# Patient Record
Sex: Male | Born: 1935 | Race: White | Hispanic: No | Marital: Married | Smoking: Never smoker
Health system: Southern US, Community
[De-identification: ages and names within clinical notes are randomized; demographics above are authoritative.]

## PROBLEM LIST (undated history)

## (undated) DIAGNOSIS — C9590 Leukemia, unspecified not having achieved remission: Secondary | ICD-10-CM

## (undated) DIAGNOSIS — H8110 Benign paroxysmal vertigo, unspecified ear: Secondary | ICD-10-CM

## (undated) DIAGNOSIS — Z8619 Personal history of other infectious and parasitic diseases: Secondary | ICD-10-CM

## (undated) DIAGNOSIS — C8300 Small cell B-cell lymphoma, unspecified site: Secondary | ICD-10-CM

## (undated) DIAGNOSIS — C4491 Basal cell carcinoma of skin, unspecified: Secondary | ICD-10-CM

## (undated) DIAGNOSIS — D472 Monoclonal gammopathy: Secondary | ICD-10-CM

## (undated) DIAGNOSIS — H903 Sensorineural hearing loss, bilateral: Secondary | ICD-10-CM

## (undated) DIAGNOSIS — Z8701 Personal history of pneumonia (recurrent): Secondary | ICD-10-CM

## (undated) DIAGNOSIS — D049 Carcinoma in situ of skin, unspecified: Secondary | ICD-10-CM

## (undated) DIAGNOSIS — D869 Sarcoidosis, unspecified: Secondary | ICD-10-CM

## (undated) DIAGNOSIS — N189 Chronic kidney disease, unspecified: Secondary | ICD-10-CM

## (undated) DIAGNOSIS — A0472 Enterocolitis due to Clostridium difficile, not specified as recurrent: Secondary | ICD-10-CM

## (undated) DIAGNOSIS — D519 Vitamin B12 deficiency anemia, unspecified: Secondary | ICD-10-CM

## (undated) DIAGNOSIS — D0339 Melanoma in situ of other parts of face: Secondary | ICD-10-CM

## (undated) HISTORY — DX: Sarcoidosis, unspecified: D86.9

## (undated) HISTORY — DX: Vitamin B12 deficiency anemia, unspecified: D51.9

## (undated) HISTORY — DX: Carcinoma in situ of skin, unspecified: D04.9

## (undated) HISTORY — DX: Benign paroxysmal vertigo, unspecified ear: H81.10

## (undated) HISTORY — DX: Basal cell carcinoma of skin, unspecified: C44.91

## (undated) HISTORY — DX: Enterocolitis due to Clostridium difficile, not specified as recurrent: A04.72

## (undated) HISTORY — DX: Chronic kidney disease, unspecified: N18.9

## (undated) HISTORY — DX: Personal history of pneumonia (recurrent): Z87.01

## (undated) HISTORY — DX: Sensorineural hearing loss, bilateral: H90.3

## (undated) HISTORY — DX: Personal history of other infectious and parasitic diseases: Z86.19

## (undated) HISTORY — DX: Small cell B-cell lymphoma, unspecified site: C83.00

## (undated) HISTORY — DX: Melanoma in situ of other parts of face: D03.39

## (undated) HISTORY — DX: Monoclonal gammopathy: D47.2

## (undated) HISTORY — DX: Leukemia, unspecified not having achieved remission: C95.90

---
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
## 1999-12-30 ENCOUNTER — Encounter: Payer: Self-pay | Admitting: Family Medicine

## 1999-12-30 LAB — CONVERTED CEMR LAB: PSA: 0.34 ng/mL

## 2003-06-01 ENCOUNTER — Encounter: Payer: Self-pay | Admitting: Family Medicine

## 2005-12-06 ENCOUNTER — Ambulatory Visit: Payer: Self-pay | Admitting: Family Medicine

## 2007-01-19 ENCOUNTER — Ambulatory Visit: Payer: Self-pay | Admitting: Family Medicine

## 2007-01-19 ENCOUNTER — Encounter (INDEPENDENT_AMBULATORY_CARE_PROVIDER_SITE_OTHER): Payer: Self-pay | Admitting: Internal Medicine

## 2007-01-19 LAB — CONVERTED CEMR LAB
Basophils Absolute: 0 10*3/uL (ref 0.0–0.1)
Creatinine, Ser: 1.81 mg/dL — ABNORMAL HIGH (ref 0.40–1.50)
Eosinophils Absolute: 0.1 10*3/uL (ref 0.0–0.6)
HCT: 44.9 % (ref 39.0–52.0)
Lymphocytes Relative: 15 % (ref 12.0–46.0)
MCHC: 33.9 g/dL (ref 30.0–36.0)
MCV: 94.5 fL (ref 78.0–100.0)
RBC: 4.75 M/uL (ref 4.22–5.81)
WBC: 12.8 10*3/uL — ABNORMAL HIGH (ref 4.5–10.5)

## 2007-01-20 ENCOUNTER — Ambulatory Visit: Payer: Self-pay | Admitting: Cardiology

## 2007-01-22 ENCOUNTER — Encounter: Payer: Self-pay | Admitting: Family Medicine

## 2007-03-02 ENCOUNTER — Ambulatory Visit: Payer: Self-pay | Admitting: Family Medicine

## 2007-03-02 DIAGNOSIS — D869 Sarcoidosis, unspecified: Secondary | ICD-10-CM

## 2007-03-04 ENCOUNTER — Encounter (INDEPENDENT_AMBULATORY_CARE_PROVIDER_SITE_OTHER): Payer: Self-pay | Admitting: *Deleted

## 2007-03-10 ENCOUNTER — Encounter: Payer: Self-pay | Admitting: Family Medicine

## 2007-03-12 ENCOUNTER — Ambulatory Visit: Payer: Self-pay | Admitting: Family Medicine

## 2008-09-30 DIAGNOSIS — Z8619 Personal history of other infectious and parasitic diseases: Secondary | ICD-10-CM

## 2008-09-30 HISTORY — DX: Personal history of other infectious and parasitic diseases: Z86.19

## 2009-04-17 ENCOUNTER — Ambulatory Visit: Payer: Self-pay | Admitting: Family Medicine

## 2009-04-17 DIAGNOSIS — H8110 Benign paroxysmal vertigo, unspecified ear: Secondary | ICD-10-CM | POA: Insufficient documentation

## 2009-04-21 ENCOUNTER — Telehealth: Payer: Self-pay | Admitting: Internal Medicine

## 2009-05-19 ENCOUNTER — Ambulatory Visit: Payer: Self-pay | Admitting: Family Medicine

## 2009-05-19 DIAGNOSIS — B029 Zoster without complications: Secondary | ICD-10-CM | POA: Insufficient documentation

## 2010-05-02 ENCOUNTER — Encounter (INDEPENDENT_AMBULATORY_CARE_PROVIDER_SITE_OTHER): Payer: Self-pay | Admitting: *Deleted

## 2010-10-30 NOTE — Letter (Signed)
Summary: Nadara Eaton letter  [place] at [facility]  [address] [place], [place] [address]   Phone: [phone]  Fax: [phone]       05/02/2010 MRN: [id number]  Harold Chavez [address] [place], [place]  [address]  Dear Mr. Canedo,  [facility] [place], and [place] announce the retirement of Arta Silence, M.D., from full-time practice at the [facility] office effective March 29, 2010 and his plans of returning part-time.  It is important to Dr. Hetty Ely and to our practice that you understand that [facility] has seven physicians in our office for your health care needs.  We will continue to offer the same exceptional care that you have today.    Dr. Hetty Ely has spoken to many of you about his plans for retirement and returning part-time in the fall.   We will continue to work with you through the transition to schedule appointments for you in the office and meet the high standards that [facility] is committed to.   Again, it is with great pleasure that we share the news that Dr. Hetty Ely will return to [facility] in October of 2011 with a reduced schedule.    If you have any questions, or would like to request an appointment with one of our physicians, please call us at [phone] and press the option for Scheduling an appointment.  We take pleasure in providing you with excellent patient care and look forward to seeing you at your next office visit.  Our [facility] Physicians are:  Tillman Abide, M.D. Laurita Quint, M.D. Roxy Manns, M.D. Kerby Nora, M.D. Hannah Beat, M.D. Ruthe Mannan, M.D. We proudly welcomed Raechel Ache, M.D. and Eustaquio Boyden, M.D. to the practice in July/August 2011.  Sincerely,  [facility]

## 2011-02-15 NOTE — Assessment & Plan Note (Signed)
[facility]                                 ON-CALL NOTE   YOSSEF, GILKISON                         MRN:          [id number]  DATE:01/19/2007                            DOB:          [birth year]    CALLER:  Riley Kill.   TELEPHONE NUMBER:  [phone]   SUBJECTIVE:  Mr. Suriano wife is calling this evening. She states that  starting yesterday evening, he had left lower back pain over towards the  flank that was fairly severe and intermittent, along with several  episodes of vomiting. That resolved last night after taking Tylenol. He  felt fine earlier today and then the pain has returned this evening. He  has also been somewhat constipated and has had gas. He denies fevers,  chills, abdominal pain, shortness of breath, or chest pain. The pain is  about two inches above his waist line and two inches towards his flank  on his left lower back.   ASSESSMENT AND PLAN:  Low back pain and possible flank pain: It is  possible that this is secondary to a kidney stone versus musculoskeletal  pain versus referred abdominal pain. I discussed with Mrs. Windom to talk  with him about the severity of the pain and if his pain is moderate to  severe to be seen at any emergency room this evening, as well as if he  develops vomiting and fever. If his pain is more mild to moderate, he  can consider waiting until tomorrow to be seen at the outpatient clinic,  given that it is 2am on Sunday night.     Kerby Nora, MD  Electronically Signed    AB/MedQ  DD: 01/19/2007  DT: 01/19/2007  Job #: [phone] [id number]

## 2011-11-18 ENCOUNTER — Ambulatory Visit (INDEPENDENT_AMBULATORY_CARE_PROVIDER_SITE_OTHER): Payer: Medicare Other | Admitting: Family Medicine

## 2011-11-18 ENCOUNTER — Encounter: Payer: Self-pay | Admitting: Family Medicine

## 2011-11-18 VITALS — BP 140/82 | HR 100 | Temp 98.8°F | Wt 161.8 lb

## 2011-11-18 DIAGNOSIS — J069 Acute upper respiratory infection, unspecified: Secondary | ICD-10-CM | POA: Diagnosis not present

## 2011-11-18 MED ORDER — AZITHROMYCIN 250 MG PO TABS: ORAL_TABLET | ORAL | Status: AC

## 2011-11-18 NOTE — Assessment & Plan Note (Signed)
Anticipate viral URTI.  discussed symptomatic relief (see instructions) If not improving, fill script (sent home with printed script for zpack).

## 2011-11-18 NOTE — Patient Instructions (Signed)
Sounds like you have a upper respiratory infection, likely viral. Viral infections usually take 7-10 days to resolve.  The cough can last a few weeks to go away. Use medication as prescribed: zpack to hold on to in case not improving as expected or any worsening. Push fluids and plenty of rest. continue mucinex with fluid.  If feeling congested, may use nasal saline.  Continue tylenol as needed. Please return if you are not improving as expected, or if you have high fevers (>101.5) or difficulty swallowing or worsening productive cough. Call clinic with questions.  Good to see you today.

## 2011-11-18 NOTE — Progress Notes (Signed)
  Subjective:    Patient ID: Harold Chavez, male    DOB: 06-01-1924, 78 y.o.   MRN: [id number]  HPI CC: cold sxs  5d h/o upper respiratory symptoms.  Low grade fever up to 100.7 today.  Mild cough productive of mild sputum.  + congested in sinuses.  So far has tried mucinex DM and tylenol.  No RN, ST, PNDrainage, no ear pain or tooth pain.  No headache.  No abd pain, chest pain, SOB, n/v.  + son sick at home.  No smokers at home.  No h/o asthma, COPD.  Review of Systems per HPI    Objective:   Physical Exam  Nursing note and vitals reviewed. Constitutional: He appears well-developed and well-nourished. No distress.  HENT:  Head: Normocephalic and atraumatic.  Right Ear: Hearing, tympanic membrane, external ear and ear canal normal.  Left Ear: Hearing, tympanic membrane, external ear and ear canal normal.  Nose: Nose normal. No mucosal edema or rhinorrhea. Right sinus exhibits no maxillary sinus tenderness and no frontal sinus tenderness. Left sinus exhibits no maxillary sinus tenderness and no frontal sinus tenderness.  Mouth/Throat: Uvula is midline, oropharynx is clear and moist and mucous membranes are normal. No oropharyngeal exudate, posterior oropharyngeal edema, posterior oropharyngeal erythema or tonsillar abscesses.       Cerumen R canal Thick white drainage in back of pharynxi  Eyes: Conjunctivae and EOM are normal. Pupils are equal, round, and reactive to light. No scleral icterus.  Neck: Normal range of motion. Neck supple.  Cardiovascular: Normal rate, regular rhythm, normal heart sounds and intact distal pulses.   No murmur heard. Pulmonary/Chest: Effort normal and breath sounds normal. No respiratory distress. He has no wheezes. He has no rales.  Lymphadenopathy:    He has no cervical adenopathy.  Skin: Skin is warm and dry. No rash noted.       Assessment & Plan:

## 2011-11-28 ENCOUNTER — Ambulatory Visit (INDEPENDENT_AMBULATORY_CARE_PROVIDER_SITE_OTHER): Payer: Medicare Other | Admitting: Family Medicine

## 2011-11-28 ENCOUNTER — Encounter: Payer: Self-pay | Admitting: Family Medicine

## 2011-11-28 VITALS — BP 144/90 | HR 100 | Temp 97.8°F | Wt 158.2 lb

## 2011-11-28 DIAGNOSIS — R21 Rash and other nonspecific skin eruption: Secondary | ICD-10-CM | POA: Diagnosis not present

## 2011-11-28 NOTE — Progress Notes (Signed)
  Subjective:    Patient ID: Harold Chavez, male    DOB: October 24, 1923, 78 y.o.   MRN: [id number]  HPI CC: rash  Seen here 11/15/2011 with dx viral URTI, sent home with zpack in case not improving.  Actually filled zpack and finished taking yesterday.  Feeling better today.  Started noticing bumps BEFORE zpack started.  Had also taken mucinex.  Rash started left back shoulder, has spread all over trunk and back.  Spares face, arms, legs.  Some itchy, not painful.    Denies new lotions, detergents, soaps, shampoos, new foods, new medicines.  H/o shingles years ago, treated with antiviral.  No sequelae.  No one else at home with rash.  No fevers/chills, nausea/vomiting, abd pain, joint pains, oral lesions.  Review of Systems Per HPI    Objective:   Physical Exam  Nursing note and vitals reviewed. Constitutional: He appears well-developed and well-nourished. No distress.  HENT:  Mouth/Throat: Oropharynx is clear and moist. No oropharyngeal exudate.       No oral lesions  Skin: Skin is warm and dry. Rash noted.          Diffuse excoriated papular rash trunk and back.  Mildly pruritic Spares face, arms, legs       Assessment & Plan:

## 2011-11-28 NOTE — Assessment & Plan Note (Signed)
Not vesicular, not consistent with shingles. ?viral exanthem vs bug bites. rec oatmeal bath and avoid scratching. Update [place] if not improved. See pt instructions.

## 2011-11-28 NOTE — Patient Instructions (Signed)
This could be viral rash after respiratory infection you had 2 weeks ago. Wash all bedding and clothing. This is not shingles. Use oatmeal bath with lukewarm water. Let me know if worsening or not improving as expected.

## 2012-01-14 DIAGNOSIS — H2510 Age-related nuclear cataract, unspecified eye: Secondary | ICD-10-CM | POA: Diagnosis not present

## 2012-01-14 DIAGNOSIS — H25049 Posterior subcapsular polar age-related cataract, unspecified eye: Secondary | ICD-10-CM | POA: Diagnosis not present

## 2012-01-14 DIAGNOSIS — H25019 Cortical age-related cataract, unspecified eye: Secondary | ICD-10-CM | POA: Diagnosis not present

## 2012-01-21 DIAGNOSIS — Z0100 Encounter for examination of eyes and vision without abnormal findings: Secondary | ICD-10-CM | POA: Diagnosis not present

## 2012-10-27 ENCOUNTER — Telehealth: Payer: Self-pay | Admitting: Internal Medicine

## 2013-06-11 DIAGNOSIS — C9110 Chronic lymphocytic leukemia of B-cell type not having achieved remission: Secondary | ICD-10-CM | POA: Diagnosis not present

## 2013-06-11 DIAGNOSIS — R197 Diarrhea, unspecified: Secondary | ICD-10-CM | POA: Diagnosis not present

## 2013-06-11 DIAGNOSIS — A0472 Enterocolitis due to Clostridium difficile, not specified as recurrent: Secondary | ICD-10-CM | POA: Diagnosis not present

## 2013-09-30 DIAGNOSIS — C8300 Small cell B-cell lymphoma, unspecified site: Secondary | ICD-10-CM

## 2013-09-30 DIAGNOSIS — D0339 Melanoma in situ of other parts of face: Secondary | ICD-10-CM

## 2013-09-30 HISTORY — DX: Small cell B-cell lymphoma, unspecified site: C83.00

## 2013-09-30 HISTORY — DX: Melanoma in situ of other parts of face: D03.39

## 2013-09-30 HISTORY — PX: MOHS SURGERY: SUR867

## 2013-11-06 ENCOUNTER — Emergency Department (HOSPITAL_COMMUNITY): Payer: Medicare Other

## 2013-11-06 ENCOUNTER — Encounter (HOSPITAL_COMMUNITY): Payer: Self-pay | Admitting: Emergency Medicine

## 2013-11-06 ENCOUNTER — Emergency Department (HOSPITAL_COMMUNITY)
Admission: EM | Admit: 2013-11-06 | Discharge: 2013-11-06 | Disposition: A | Discharge status: Home / Self Care | Payer: Medicare Other | Attending: Emergency Medicine | Admitting: Emergency Medicine

## 2013-11-06 DIAGNOSIS — R000 Tachycardia, unspecified: Secondary | ICD-10-CM | POA: Insufficient documentation

## 2013-11-06 DIAGNOSIS — R5383 Other fatigue: Secondary | ICD-10-CM

## 2013-11-06 DIAGNOSIS — R5381 Other malaise: Secondary | ICD-10-CM | POA: Diagnosis not present

## 2013-11-06 DIAGNOSIS — R599 Enlarged lymph nodes, unspecified: Secondary | ICD-10-CM | POA: Diagnosis not present

## 2013-11-06 DIAGNOSIS — R748 Abnormal levels of other serum enzymes: Secondary | ICD-10-CM | POA: Diagnosis not present

## 2013-11-06 DIAGNOSIS — Z8619 Personal history of other infectious and parasitic diseases: Secondary | ICD-10-CM | POA: Insufficient documentation

## 2013-11-06 DIAGNOSIS — R799 Abnormal finding of blood chemistry, unspecified: Secondary | ICD-10-CM

## 2013-11-06 DIAGNOSIS — Z7982 Long term (current) use of aspirin: Secondary | ICD-10-CM | POA: Insufficient documentation

## 2013-11-06 DIAGNOSIS — Z8669 Personal history of other diseases of the nervous system and sense organs: Secondary | ICD-10-CM | POA: Insufficient documentation

## 2013-11-06 DIAGNOSIS — R591 Generalized enlarged lymph nodes: Secondary | ICD-10-CM

## 2013-11-06 DIAGNOSIS — R404 Transient alteration of awareness: Secondary | ICD-10-CM | POA: Diagnosis not present

## 2013-11-06 DIAGNOSIS — R55 Syncope and collapse: Secondary | ICD-10-CM | POA: Insufficient documentation

## 2013-11-06 DIAGNOSIS — H8110 Benign paroxysmal vertigo, unspecified ear: Secondary | ICD-10-CM

## 2013-11-06 DIAGNOSIS — R779 Abnormality of plasma protein, unspecified: Secondary | ICD-10-CM

## 2013-11-06 DIAGNOSIS — R7989 Other specified abnormal findings of blood chemistry: Secondary | ICD-10-CM

## 2013-11-06 DIAGNOSIS — R21 Rash and other nonspecific skin eruption: Secondary | ICD-10-CM | POA: Insufficient documentation

## 2013-11-06 DIAGNOSIS — R6889 Other general symptoms and signs: Secondary | ICD-10-CM | POA: Diagnosis not present

## 2013-11-06 DIAGNOSIS — R634 Abnormal weight loss: Secondary | ICD-10-CM | POA: Insufficient documentation

## 2013-11-06 DIAGNOSIS — J449 Chronic obstructive pulmonary disease, unspecified: Secondary | ICD-10-CM | POA: Diagnosis not present

## 2013-11-06 LAB — CBC WITH DIFFERENTIAL/PLATELET
Basophils Absolute: 0 10*3/uL (ref 0.0–0.1)
Basophils Relative: 0 % (ref 0–1)
Eosinophils Absolute: 0.1 10*3/uL (ref 0.0–0.7)
Eosinophils Relative: 1 % (ref 0–5)
HCT: 29.5 % — ABNORMAL LOW (ref 39.0–52.0)
Hemoglobin: 10.1 g/dL — ABNORMAL LOW (ref 13.0–17.0)
LYMPHS PCT: 29 % (ref 12–46)
Lymphs Abs: 2 10*3/uL (ref 0.7–4.0)
MCH: 34.7 pg — ABNORMAL HIGH (ref 26.0–34.0)
MCHC: 34.2 g/dL (ref 30.0–36.0)
MCV: 101.4 fL — ABNORMAL HIGH (ref 78.0–100.0)
Monocytes Absolute: 0.6 10*3/uL (ref 0.1–1.0)
Monocytes Relative: 9 % (ref 3–12)
Neutro Abs: 4.1 10*3/uL (ref 1.7–7.7)
Neutrophils Relative %: 61 % (ref 43–77)
PLATELETS: 210 10*3/uL (ref 150–400)
RBC: 2.91 MIL/uL — ABNORMAL LOW (ref 4.22–5.81)
RDW: 14.4 % (ref 11.5–15.5)
WBC: 6.7 10*3/uL (ref 4.0–10.5)

## 2013-11-06 LAB — POCT I-STAT TROPONIN I: TROPONIN I, POC: 0.02 ng/mL (ref 0.00–0.08)

## 2013-11-06 LAB — COMPREHENSIVE METABOLIC PANEL
ALT: 5 U/L (ref 0–53)
AST: 18 U/L (ref 0–37)
Albumin: 2.5 g/dL — ABNORMAL LOW (ref 3.5–5.2)
Alkaline Phosphatase: 74 U/L (ref 39–117)
BILIRUBIN TOTAL: 1 mg/dL (ref 0.3–1.2)
BUN: 31 mg/dL — ABNORMAL HIGH (ref 6–23)
CHLORIDE: 98 meq/L (ref 96–112)
CO2: 24 meq/L (ref 19–32)
Calcium: 8.3 mg/dL — ABNORMAL LOW (ref 8.4–10.5)
Creatinine, Ser: 1.93 mg/dL — ABNORMAL HIGH (ref 0.50–1.35)
GFR calc Af Amer: 34 mL/min — ABNORMAL LOW (ref >90)
GFR, EST NON AFRICAN AMERICAN: 30 mL/min — AB (ref >90)
Glucose, Bld: 96 mg/dL (ref 70–99)
Potassium: 5.2 mEq/L (ref 3.7–5.3)
SODIUM: 131 meq/L — AB (ref 137–147)
Total Protein: 13.8 g/dL — ABNORMAL HIGH (ref 6.0–8.3)

## 2013-11-06 LAB — URINALYSIS, ROUTINE W REFLEX MICROSCOPIC
Bilirubin Urine: NEGATIVE
GLUCOSE, UA: NEGATIVE mg/dL
KETONES UR: NEGATIVE mg/dL
Leukocytes, UA: NEGATIVE
Nitrite: NEGATIVE
PROTEIN: 30 mg/dL — AB
Specific Gravity, Urine: 1.017 (ref 1.005–1.030)
Urobilinogen, UA: 1 mg/dL (ref 0.0–1.0)
pH: 5 (ref 5.0–8.0)

## 2013-11-06 LAB — URINE MICROSCOPIC-ADD ON

## 2013-11-06 LAB — MAGNESIUM: Magnesium: 1.9 mg/dL (ref 1.5–2.5)

## 2013-11-06 LAB — PHOSPHORUS: Phosphorus: 3.8 mg/dL (ref 2.3–4.6)

## 2013-11-06 LAB — PROTIME-INR
INR: 1.26 (ref 0.00–1.49)
PROTHROMBIN TIME: 15.5 s — AB (ref 11.6–15.2)

## 2013-11-06 LAB — TROPONIN I: Troponin I: <0.3 ng/mL (ref <0.30)

## 2013-11-06 MED ORDER — SODIUM CHLORIDE 0.9 % IV BOLUS (SEPSIS)
1000.0000 mL | Freq: Once | INTRAVENOUS | Status: AC
  Administered 2013-11-06: 1000 mL via INTRAVENOUS

## 2013-11-06 MED ORDER — SODIUM CHLORIDE 0.9 % IV BOLUS (SEPSIS)
500.0000 mL | Freq: Once | INTRAVENOUS | Status: AC
  Administered 2013-11-06: 500 mL via INTRAVENOUS

## 2013-11-06 NOTE — ED Notes (Signed)
Iv nss hung for a liter bolus.  Vitals good.  No pain  Alert talking with family

## 2013-11-06 NOTE — ED Notes (Signed)
The pt reports that he has been feeling weaker with no energy for several days before today.

## 2013-11-06 NOTE — ED Notes (Signed)
Per EMS- Pt was outside with family doing yardwork was sitting down when his son reports he became unresponsive for about 3 mins. Pt did not fall over. Denies any pain, negative stroke screen. When EMS arrived he was x 4. Initial BP 98 systolic, recent 680/32, HR 105 ST. 18 gauge to left AC. Pt has rash to his chest and back x 3 weeks.

## 2013-11-06 NOTE — Consult Note (Addendum)
[facility] Medical Consultation  Harold Chavez UMP:[id number] DOB: Sep 25, 1924 DOA: 11/06/2013 PCP: Ria Bush, MD   Requesting physician: Dr. Tawnya Crook Date of consultation: 11/06/13 Reason for consultation: near syncopal episode, consult for further evaluation and recommendations.  Impression/Recommendations Active Problems:   Near syncope - Patient reports poor fluid oral intake. The patient was given 1.5 L of normal saline. Currently feels much better. Patient was ambulated and he denied any dizziness or unsteadiness on his feet. He most likely etiology is hypovolemia which has been treated with IV fluid rehydration.  - At this juncture most likely cause of near syncope as listed above has been resolved and patient currently not experiencing any more symptoms.  - Plan will be for discharge with family to encourage fluid po intake. Discussed with family and patient and they are aware and agreeable.  Elevated serum protein - Have recommended outpatient evaluation which patient and family are agreeable to.  Chronic kidney disease - Last serum creatinine 1.8. Addendum: currently on this evaluation 1.9. Patient at baseline.  Sinus tachycardia - Resolving with IV fluid hydration. Suspect this was secondary to hypovolemia from poor oral fluid intake. Instructed the patient should avoid any caffeine, tea, excess sugary drinks, or coffee. Have encouraged patient to increase oral fluid intake of which currently he is agreeable to.  Lymphadenopathy - Currently no source of infection. White blood cell count within normal limits. Patient afebrile. Currently not meeting SIRs criteria. - I have also recommended outpatient evaluation of which patient and family verbalizes agreement to.   Chief Complaint: near syncope  HPI:  Patient is an 80 year old Caucasian male with history of sarcoidosis and benign positional vertigo. Who presents to the ED after a near syncopal event. He reports that  within the last several days he has not been drinking enough fluids. He denies any fevers or chills. No sick contacts reported. Patient was seen in the ED and given 1.5 L of normal saline. Currently patient has no complaints. He currently denies any vertigo or near syncope with changes in position. Patient also endorses rash which she has had and denies any associated fevers with it.  Review of Systems:  14 point review of system reviewed and negative unless otherwise mentioned above.  Past Medical History  Diagnosis Date  . Benign paroxysmal positional vertigo   . Sarcoidosis   . Herpes zoster without mention of complication    Past Surgical History  Procedure Laterality Date  . Testicle removal  1937    right; undescended   Social History:  reports that he has never smoked. He does not have any smokeless tobacco history on file. He reports that he does not drink alcohol or use illicit drugs.  No Known Allergies Family History  Problem Relation Age of Onset  . Stroke Father     Prior to Admission medications   Medication Sig Start Date End Date Taking? Authorizing Provider  aspirin 81 MG tablet Take 81 mg by mouth daily.   Yes Historical Provider, MD   Physical Exam: Blood pressure 122/68, pulse 101, temperature 98.7 F (37.1 C), temperature source Oral, resp. rate 16, height 6' (1.829 m), weight 68.04 kg (150 lb), SpO2 95.00%. Filed Vitals:   11/06/13 1736  BP: 122/68  Pulse: 101  Temp: 98.7 F (37.1 C)  Resp: 16     General:  Pt in NAD, Alert and awake  Eyes: EOMI, non icteric  ENT: normal exterior appearance  Neck: supple, no goiter  Cardiovascular: RRR, no MRG  Respiratory: CTA BL, no wheezes  Abdomen: soft, NT, ND  Skin: errythematous confluent rash at upper chest, non painful on palpation  Musculoskeletal: No cyanosis or clubbing. Normal gait  Psychiatric: Mood and affect appropriate  Neurologic: Normal gait, moves extremities equally, no facial  asymmetry, answers questions appropriately, no dizziness or vertigo reported upon standing  Labs on Admission:  Basic Metabolic Panel:  Recent Labs Lab 11/06/13 1529  NA 131*  K 5.2  CL 98  CO2 24  GLUCOSE 96  BUN 31*  CREATININE 1.93*  CALCIUM 8.3*  MG 1.9  PHOS 3.8   Liver Function Tests:  Recent Labs Lab 11/06/13 1529  AST 18  ALT 5  ALKPHOS 74  BILITOT 1.0  PROT 13.8*  ALBUMIN 2.5*   No results found for this basename: LIPASE, AMYLASE,  in the last 168 hours No results found for this basename: AMMONIA,  in the last 168 hours CBC:  Recent Labs Lab 11/06/13 1529  WBC 6.7  NEUTROABS 4.1  HGB 10.1*  HCT 29.5*  MCV 101.4*  PLT 210   Cardiac Enzymes:  Recent Labs Lab 11/06/13 1529  TROPONINI <0.30   BNP: No components found with this basename: POCBNP,  CBG: No results found for this basename: GLUCAP,  in the last 168 hours  Radiological Exams on Admission: Dg Chest 2 View  11/06/2013   CLINICAL DATA:  Near syncopal episode today.  EXAM: CHEST  2 VIEW  COMPARISON:  None.  FINDINGS: Normal sized heart. Tortuous aorta. Hyperexpanded lungs with diffuse peribronchial thickening and accentuation of the interstitial markings. Prominent central pulmonary arteries. Diffuse osteopenia.  IMPRESSION: No acute abnormality. Changes of COPD and chronic bronchitis with probable pulmonary arterial hypertension.   Electronically Signed   By: Enrique Sack M.D.   On: 11/06/2013 16:28    EKG: Independently reviewed. Sinus tachycardia with no ST elevation or depressions. Time spent: > 35 minutes  Velvet Bathe [facility] Pager [phone]  If 7PM-7AM, please contact night-coverage [email or web address] Password [facility] 11/06/2013, 5:56 PM

## 2013-11-06 NOTE — ED Notes (Signed)
540ml nss added to iv

## 2013-11-06 NOTE — ED Notes (Signed)
The pt reports that he feels a little better.  nss liter infused vitals stable

## 2013-11-06 NOTE — ED Provider Notes (Signed)
CSN: [id number]     Arrival date & time 11/06/13  1437 History   First MD Initiated Contact with Patient 11/06/13 1502     Chief Complaint  Patient presents with  . Near Syncope     HPI: Harold Chavez is an 80 yo M with history of sarcoidosis who presents for evaluation of near syncopal episode. He was helping his son move lumbar when he felt fatigued. He sat down on an old car. He was sitting there for 15 minutes. His son then noticed he was laying down. He went to talk to him but he would not respond. He was yawning frequently and had a "glazed over look." After 2-3 minutes he started to come around. He did not fall and strike his head. For the last several weeks he has had worsening fatigue and weight loss. He has also noted "knots," in his neck and axilla. He denies fever, night sweats, cough, congestion, diarrhea or vomiting. His appetite has been normal, he is also urinating normally. He denies any chest pain with exertion or SOB. He has never had an episode similar to this in the past. Finally, he endorses a well demarcated, pruritic rash to his anterior chest and back for several weeks as well.    Past Medical History  Diagnosis Date  . Benign paroxysmal positional vertigo   . Sarcoidosis   . Herpes zoster without mention of complication    Past Surgical History  Procedure Laterality Date  . Testicle removal  1937    right; undescended   Family History  Problem Relation Age of Onset  . Stroke Father    History  Substance Use Topics  . Smoking status: Never Smoker   . Smokeless tobacco: Not on file  . Alcohol Use: No    Review of Systems  Constitutional: Positive for fatigue and unexpected weight change. Negative for fever, chills and appetite change.       + Diffuse lymphadenopathy   Eyes: Negative for photophobia and visual disturbance.  Respiratory: Negative for cough and shortness of breath.   Cardiovascular: Negative for chest pain and leg swelling.  Gastrointestinal:  Negative for nausea, vomiting, abdominal pain, diarrhea and constipation.  Genitourinary: Negative for dysuria, frequency and decreased urine volume.  Musculoskeletal: Negative for arthralgias, back pain, gait problem and myalgias.  Skin: Positive for rash. Negative for color change and wound.  Neurological: Positive for syncope (near syncope). Negative for dizziness, light-headedness and headaches.  Psychiatric/Behavioral: Negative for confusion and agitation.  All other systems reviewed and are negative.    Allergies  Review of patient's allergies indicates no known allergies.  Home Medications   Current Outpatient Rx  Name  Route  Sig  Dispense  Refill  . aspirin 81 MG tablet   Oral   Take 81 mg by mouth daily.          Ht 6' (1.829 m)  Wt 150 lb (68.04 kg)  BMI 20.34 kg/m2 Physical Exam  Nursing note and vitals reviewed. Constitutional: He is oriented to person, place, and time. No distress.  Elderly male, sitting up in bed, appears well.   HENT:  Head: Normocephalic and atraumatic.  Mouth/Throat: Oropharynx is clear and moist. Mucous membranes are dry.  Eyes: Conjunctivae and EOM are normal. Pupils are equal, round, and reactive to light.  Neck: Normal range of motion. Neck supple.  Large cervical lymphadenopathy which is non tender   Cardiovascular: Regular rhythm, normal heart sounds and intact distal pulses.  Tachycardia present.  Pulmonary/Chest: Effort normal and breath sounds normal. No respiratory distress.  Abdominal: Soft. Bowel sounds are normal. There is no tenderness. There is no rebound and no guarding.  Musculoskeletal: Normal range of motion. He exhibits no edema and no tenderness.  Lymphadenopathy:  Large cervical and axillary lymph nodes.  Bilateral leg petechiae   Neurological: He is alert and oriented to person, place, and time. No cranial nerve deficit. Coordination normal.  Skin: Skin is warm and dry. Rash (diffuse, pruritic rash to anterior  chest and back) noted.  Psychiatric: He has a normal mood and affect. His behavior is normal.    ED Course  Procedures (including critical care time) Labs Review Labs Reviewed  CBC WITH DIFFERENTIAL - Abnormal; Notable for the following:    RBC 2.91 (*)    Hemoglobin 10.1 (*)    HCT 29.5 (*)    MCV 101.4 (*)    MCH 34.7 (*)    All other components within normal limits  COMPREHENSIVE METABOLIC PANEL - Abnormal; Notable for the following:    Sodium 131 (*)    BUN 31 (*)    Creatinine, Ser 1.93 (*)    Calcium 8.3 (*)    Total Protein 13.8 (*)    Albumin 2.5 (*)    GFR calc non Af Amer 30 (*)    GFR calc Af Amer 34 (*)    All other components within normal limits  URINALYSIS, ROUTINE W REFLEX MICROSCOPIC - Abnormal; Notable for the following:    APPearance CLOUDY (*)    Hgb urine dipstick MODERATE (*)    Protein, ur 30 (*)    All other components within normal limits  PROTIME-INR - Abnormal; Notable for the following:    Prothrombin Time 15.5 (*)    All other components within normal limits  URINE MICROSCOPIC-ADD ON - Abnormal; Notable for the following:    Casts HYALINE CASTS (*)    All other components within normal limits  TROPONIN I  MAGNESIUM  PHOSPHORUS  POCT I-STAT TROPONIN I   Imaging Review Dg Chest 2 View  11/06/2013   CLINICAL DATA:  Near syncopal episode today.  EXAM: CHEST  2 VIEW  COMPARISON:  None.  FINDINGS: Normal sized heart. Tortuous aorta. Hyperexpanded lungs with diffuse peribronchial thickening and accentuation of the interstitial markings. Prominent central pulmonary arteries. Diffuse osteopenia.  IMPRESSION: No acute abnormality. Changes of COPD and chronic bronchitis with probable pulmonary arterial hypertension.   Electronically Signed   By: Enrique Sack M.D.   On: 11/06/2013 16:28    EKG Interpretation   None       MDM   1. Near syncope   2. Benign paroxysmal positional vertigo   3. Elevated serum protein level   4. Syncope, near   5.  Elevated serum creatinine   6. Lymphadenopathy     80 yo M with history of sarcoidosis, who presents with near syncopal episode. Appears dry on arrival, tachycardic but HD stable otherwise. ECG shows ST with one aberant beat, QTc 473, no acute ischemia. Low suspicion for ACS given lack of anginal symptoms, ECG without ischemia, troponin negative. No evidence of UTI. No head CT obtained as his mental status is normal, no focal neurologic deficits. CXR without acute abnormality, doubt dissection given lack of chest pain, no widened mediastinum, equal pulses. Doubt PE given lack of hypoxia, no SOB or CP. Labs concerning given Hgb 10 (previous 4 years ago 14), protein of 13.8, cr 1.9 (previous 1.8). Given elevated protein  in serum combined with diffuse LAD concern for oncologic process. He does appear dehydrated which is likely the etiology of near syncopal episode today. Given his age and lab abnormalities, I spoke with the Hospitalist about admission. They evaluated the patient in the ED, felt he did not require admission now that his symptoms have resolved, tachycardia improved with fluids, Cr close to baseline. They feel further w/u can be done by his PCP early next week. i spoke to the family at length, they were in agreement with plan. The patient felt much better. He was ambulated in the ED, which he did without difficulty. Advised to f/u with his PCP in two days. Strict return precautions given.   Reviewed imaging, labs, ECG and previous medical records, utilized in MDM  Discussed case with Dr. Shelby Dubin, MD 11/07/13 [phone]

## 2013-11-06 NOTE — ED Notes (Signed)
Pt to xray

## 2013-11-06 NOTE — ED Notes (Signed)
The pt reports that he was told he was going home but another 543ml of nss has been ordered

## 2013-11-06 NOTE — ED Notes (Signed)
The pt is alert with family at the bedside.  He has no complaints at present.   Pillow given.  Minister at the bedside

## 2013-11-06 NOTE — ED Notes (Signed)
edp at the bedside 

## 2013-11-06 NOTE — ED Notes (Signed)
Pt returned from xray

## 2013-11-06 NOTE — ED Notes (Signed)
Admitting doctor at the bedside 

## 2013-11-06 NOTE — Discharge Instructions (Signed)
I think you were a little dehydrated which likely caused your symptoms. Ensure you are drinking plenty of fluids. You have several abnormal labs that you need to see your doctor about. Please call on Monday. If you have worsening symptoms, return to the ED.   Syncope Syncope is a fainting spell. This means the person loses consciousness and drops to the ground. The person is generally unconscious for less than 5 minutes. The person may have some muscle twitches for up to 15 seconds before waking up and returning to normal. Syncope occurs more often in elderly people, but it can happen to anyone. While most causes of syncope are not dangerous, syncope can be a sign of a serious medical problem. It is important to seek medical care.  CAUSES  Syncope is caused by a sudden decrease in blood flow to the brain. The specific cause is often not determined. Factors that can trigger syncope include:  Taking medicines that lower blood pressure.  Sudden changes in posture, such as standing up suddenly.  Taking more medicine than prescribed.  Standing in one place for too long.  Seizure disorders.  Dehydration and excessive exposure to heat.  Low blood sugar (hypoglycemia).  Straining to have a bowel movement.  Heart disease, irregular heartbeat, or other circulatory problems.  Fear, emotional distress, seeing blood, or severe pain. SYMPTOMS  Right before fainting, you may:  Feel dizzy or lightheaded.  Feel nauseous.  See all white or all black in your field of vision.  Have cold, clammy skin. DIAGNOSIS  Your caregiver will ask about your symptoms, perform a physical exam, and perform electrocardiography (ECG) to record the electrical activity of your heart. Your caregiver may also perform other heart or blood tests to determine the cause of your syncope. TREATMENT  In most cases, no treatment is needed. Depending on the cause of your syncope, your caregiver may recommend changing or  stopping some of your medicines. HOME CARE INSTRUCTIONS  Have someone stay with you until you feel stable.  Do not drive, operate machinery, or play sports until your caregiver says it is okay.  Keep all follow-up appointments as directed by your caregiver.  Lie down right away if you start feeling like you might faint. Breathe deeply and steadily. Wait until all the symptoms have passed.  Drink enough fluids to keep your urine clear or pale yellow.  If you are taking blood pressure or heart medicine, get up slowly, taking several minutes to sit and then stand. This can reduce dizziness. SEEK IMMEDIATE MEDICAL CARE IF:   You have a severe headache.  You have unusual pain in the chest, abdomen, or back.  You are bleeding from the mouth or rectum, or you have black or tarry stool.  You have an irregular or very fast heartbeat.  You have pain with breathing.  You have repeated fainting or seizure-like jerking during an episode.  You faint when sitting or lying down.  You have confusion.  You have difficulty walking.  You have severe weakness.  You have vision problems. If you fainted, call your local emergency services (911 in [place] Do not drive yourself to the hospital.  MAKE SURE YOU:  Understand these instructions.  Will watch your condition.  Will get help right away if you are not doing well or get worse. Document Released: 09/16/2005 Document Revised: 03/17/2012 Document Reviewed: 11/15/2011 [facility] Patient Information 2014 [place].

## 2013-11-07 ENCOUNTER — Telehealth: Payer: Self-pay | Admitting: Family Medicine

## 2013-11-07 NOTE — Telephone Encounter (Signed)
Pt recently seen at ER with abnormal labs.  plz schedule 30 min f/u appt at his convenience.

## 2013-11-07 NOTE — ED Provider Notes (Signed)
Medical screening examination/treatment/procedure(s) were conducted as a shared visit with resident-physician practitioner(s) and myself.  I personally evaluated the patient during the encounter.  Pt is a 80 y.o. male with pmhx as above presenting with near syncopal episode outside today.  Pt has also have widespread lymphadenopathy.  No focal neuro findings. EKG w/o ischemic changes. Trop negative.   HR improved with IVF boluses. He is well appearing on PE, in NAD.   Cardiopulm exam benign.  [facility] consulted, saw pt in ED, but did not feel pt required admission.  Both family & pt agree to f/u closely with PCP.   EKG Interpretation    Date/Time:  Saturday November 06 2013 14:47:43 EST Ventricular Rate:  110 PR Interval:  165 QRS Duration: 87 QT Interval:  350 QTC Calculation: 473 R Axis:   -53 Text Interpretation:  Sinus tachycardia LAD, consider left anterior fascicular block No significant change since last tracing Confirmed by [place]  MD, Dawson ([id number]) on 11/07/2013 11:45:28 PM             Neta Ehlers, MD 11/07/13 [id number]

## 2013-11-08 ENCOUNTER — Telehealth: Payer: Self-pay | Admitting: Family Medicine

## 2013-11-08 NOTE — Telephone Encounter (Signed)
Dr. Darnell Level advised this didn't need to be today. Will schedule as directed.

## 2013-11-08 NOTE — Telephone Encounter (Signed)
Done

## 2013-11-08 NOTE — Telephone Encounter (Signed)
Patient Information:  Caller Name: [name]  Phone: [phone]  Patient: [place], [place]  Gender: Male  DOB: [birth year]  Age: 80 Years  PCP: Ria Bush [facility])  Office Follow Up:  Does the office need to follow up with this patient?: Yes  Instructions For The Office: PLEASE SCHEDULE ER FOLLOW UP APPT  RN Note:  They need a follow up ER appt. after being unresponsive.  Son states father does not have Chest pain , no shortness of breath, no weakness, able to ambulate.  Please contact for ER follow up appt.  Symptoms  Reason For Call & Symptoms: His father was taken to ER [facility]) on Saturday 11/06/13 for unresponsive.  He was transported 911.  Found to be Dehydrated, Eleveated protein in urine and swollen Lymph glands in neck and axillary area. Son is calling to schedule follow up appt.  Son states he feels better today but was told to follow up today.  Reviewed Health History In EMR: Yes  Reviewed Medications In EMR: Yes  Reviewed Allergies In EMR: Yes  Reviewed Surgeries / Procedures: Yes  Date of Onset of Symptoms: 11/06/2013  Guideline(s) Used:  No Protocol Available - Sick Adult  Disposition Per Guideline:   Discuss with PCP and Callback by Nurse Today  Reason For Disposition Reached:   Nursing judgment  Advice Given:  Call Back If:  New symptoms develop  You become worse.  RN Overrode Recommendation:  Make Appointment  ER FOLLOW UP APPT NEEDED

## 2013-11-08 NOTE — Telephone Encounter (Addendum)
plz schedule 56min f/u appt in next 1-2 days [facility] wednesday at noon

## 2013-11-08 NOTE — Telephone Encounter (Signed)
Appt scheduled

## 2013-11-08 NOTE — Telephone Encounter (Signed)
Spoke with patient's son and scheduled for Wednesday at noon. (Blair-could you please open that appt slot and schedule him there please? Thanks!)

## 2013-11-10 ENCOUNTER — Encounter: Payer: Self-pay | Admitting: Family Medicine

## 2013-11-10 ENCOUNTER — Ambulatory Visit (INDEPENDENT_AMBULATORY_CARE_PROVIDER_SITE_OTHER): Payer: Medicare Other | Admitting: Family Medicine

## 2013-11-10 VITALS — BP 138/82 | HR 120 | Temp 98.3°F | Wt 151.0 lb

## 2013-11-10 DIAGNOSIS — N183 Chronic kidney disease, stage 3 unspecified: Secondary | ICD-10-CM | POA: Insufficient documentation

## 2013-11-10 DIAGNOSIS — D239 Other benign neoplasm of skin, unspecified: Secondary | ICD-10-CM

## 2013-11-10 DIAGNOSIS — R55 Syncope and collapse: Secondary | ICD-10-CM

## 2013-11-10 DIAGNOSIS — R21 Rash and other nonspecific skin eruption: Secondary | ICD-10-CM

## 2013-11-10 DIAGNOSIS — D229 Melanocytic nevi, unspecified: Secondary | ICD-10-CM

## 2013-11-10 DIAGNOSIS — N189 Chronic kidney disease, unspecified: Secondary | ICD-10-CM

## 2013-11-10 DIAGNOSIS — D869 Sarcoidosis, unspecified: Secondary | ICD-10-CM | POA: Diagnosis not present

## 2013-11-10 DIAGNOSIS — D531 Other megaloblastic anemias, not elsewhere classified: Secondary | ICD-10-CM | POA: Insufficient documentation

## 2013-11-10 DIAGNOSIS — D539 Nutritional anemia, unspecified: Secondary | ICD-10-CM | POA: Diagnosis not present

## 2013-11-10 DIAGNOSIS — R599 Enlarged lymph nodes, unspecified: Secondary | ICD-10-CM | POA: Diagnosis not present

## 2013-11-10 DIAGNOSIS — R779 Abnormality of plasma protein, unspecified: Secondary | ICD-10-CM

## 2013-11-10 DIAGNOSIS — R591 Generalized enlarged lymph nodes: Secondary | ICD-10-CM | POA: Insufficient documentation

## 2013-11-10 DIAGNOSIS — R799 Abnormal finding of blood chemistry, unspecified: Secondary | ICD-10-CM

## 2013-11-10 DIAGNOSIS — Z85820 Personal history of malignant melanoma of skin: Secondary | ICD-10-CM | POA: Insufficient documentation

## 2013-11-10 LAB — COMPREHENSIVE METABOLIC PANEL
ALT: 9 U/L (ref 0–53)
AST: 18 U/L (ref 0–37)
Albumin: 2.4 g/dL — ABNORMAL LOW (ref 3.5–5.2)
Alkaline Phosphatase: 65 U/L (ref 39–117)
BUN: 19 mg/dL (ref 6–23)
CO2: 26 mEq/L (ref 19–32)
Calcium: 8.2 mg/dL — ABNORMAL LOW (ref 8.4–10.5)
Chloride: 103 mEq/L (ref 96–112)
Creatinine, Ser: 1.7 mg/dL — ABNORMAL HIGH (ref 0.4–1.5)
GFR: 40.14 mL/min — AB (ref >60.00)
Glucose, Bld: 104 mg/dL — ABNORMAL HIGH (ref 70–99)
Potassium: 4.4 mEq/L (ref 3.5–5.1)
Sodium: 132 mEq/L — ABNORMAL LOW (ref 135–145)
Total Bilirubin: 0.9 mg/dL (ref 0.3–1.2)
Total Protein: 11.5 g/dL — ABNORMAL HIGH (ref 6.0–8.3)

## 2013-11-10 LAB — IBC PANEL
Iron: 43 ug/dL (ref 42–165)
SATURATION RATIOS: 20.7 % (ref 20.0–50.0)
Transferrin: 148.5 mg/dL — ABNORMAL LOW (ref 212.0–360.0)

## 2013-11-10 LAB — FOLATE: Folate: 10.5 ng/mL (ref >5.9)

## 2013-11-10 LAB — TSH: TSH: 3 u[IU]/mL (ref 0.35–5.50)

## 2013-11-10 LAB — FERRITIN: FERRITIN: 111.8 ng/mL (ref 22.0–322.0)

## 2013-11-10 LAB — VITAMIN B12: Vitamin B-12: 164 pg/mL — ABNORMAL LOW (ref 211–911)

## 2013-11-10 NOTE — Progress Notes (Addendum)
BP 138/82  Pulse 120  Temp(Src) 98.3 F (36.8 C) (Oral)  Wt 151 lb (68.493 kg)   CC: ER f/u  Subjective:    Patient ID: Harold Chavez, male    DOB: 1923-11-03, 80 y.o.   MRN: [id number]  HPI: Harold Chavez is a 80 y.o. male presenting on 11/10/2013 with Follow-up  [name] presents today with 2 sons for ER follow up visit for syncope thought due to dehydration.  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  He had episode of altered consciousness and confusion during this time.  Rehydrated with IVF and discharged home.  During ER visit, was found to have elevated protein level to 13.8, ARF with Cr 1.9 and generalized lymphadenopathy.  He does have known history of sarcoidosis.  No LOC per se.  Noticed generalized LAD over last 3 months along with new scaly rash on trunk and chest, mildly pruritic, for last 1 month.    Denies fevers/chills, night sweats, headache, vision changes, chest pain/tightness, shortness of breath, dizziness or presyncope (other than this weekend).  Appetite ok.   + fatigue, minimal weight loss. Never smoker.  Wt Readings from Last 3 Encounters:  11/10/13 151 lb (68.493 kg)  11/06/13 150 lb (68.04 kg)  11/28/11 158 lb 4 oz (71.782 kg)  Body mass index is 20.47 kg/(m^2).  Relevant past medical, surgical, family and social history reviewed and updated. Allergies and medications reviewed and updated. Current Outpatient Prescriptions on File Prior to Visit  Medication Sig  . aspirin 81 MG tablet Take 81 mg by mouth daily.   No current facility-administered medications on file prior to visit.    Review of Systems Per HPI unless specifically indicated above    Objective:    BP 138/82  Pulse 120  Temp(Src) 98.3 F (36.8 C) (Oral)  Wt 151 lb (68.493 kg)  Physical Exam  Nursing note and vitals reviewed. Constitutional: He appears well-developed and well-nourished. No distress.  HENT:  Head: Normocephalic and atraumatic.    Mouth/Throat: Oropharynx is clear and moist. No oropharyngeal exudate.  Hard of hearing  Eyes: Conjunctivae and EOM are normal. Pupils are equal, round, and reactive to light.  Neck: Normal range of motion. Neck supple. No thyromegaly present.  Cardiovascular: Regular rhythm, normal heart sounds and intact distal pulses.  Tachycardia present.   No murmur heard. Pulmonary/Chest: Effort normal and breath sounds normal. No respiratory distress. He has no wheezes. He has no rales.  Abdominal: Soft. Normal appearance and bowel sounds are normal. He exhibits no distension and no mass. There is no hepatosplenomegaly. There is no tenderness. There is no rigidity, no rebound, no guarding and negative Murphy's sign.  Musculoskeletal: He exhibits no edema.  Onycholysis R>L  Lymphadenopathy:       Head (right side): Submandibular and tonsillar adenopathy present.       Head (left side): Submandibular and tonsillar adenopathy present.    He has cervical adenopathy.       Right cervical: Superficial cervical and deep cervical adenopathy present.       Left cervical: Superficial cervical and deep cervical adenopathy present.    He has axillary adenopathy (mild).       Right: Inguinal and supraclavicular adenopathy present.       Left: Inguinal and supraclavicular adenopathy present.  Largest LAD present bilateral posterior cervical region, nontender soft and fixed  Skin: Skin is warm and dry. Rash noted. There is erythema.  1. Irregular appearing  nevus on right cheek with different colors - present over last 1 year 2. Petechial nonblanching rash on lower legs 3. Diffuse well demarcated erythematous scaly rash in shawl distribution  Psychiatric: He has a normal mood and affect.   Results for orders placed during the hospital encounter of 11/06/13  CBC WITH DIFFERENTIAL      Result Value Ref Range   WBC 6.7  4.0 - 10.5 K/uL   RBC 2.91 (*) 4.22 - 5.81 MIL/uL   Hemoglobin 10.1 (*) 13.0 - 17.0 g/dL    HCT 29.5 (*) 39.0 - 52.0 %   MCV 101.4 (*) 78.0 - 100.0 fL   MCH 34.7 (*) 26.0 - 34.0 pg   MCHC 34.2  30.0 - 36.0 g/dL   RDW 14.4  11.5 - 15.5 %   Platelets 210  150 - 400 K/uL   Neutrophils Relative % 61  43 - 77 %   Neutro Abs 4.1  1.7 - 7.7 K/uL   Lymphocytes Relative 29  12 - 46 %   Lymphs Abs 2.0  0.7 - 4.0 K/uL   Monocytes Relative 9  3 - 12 %   Monocytes Absolute 0.6  0.1 - 1.0 K/uL   Eosinophils Relative 1  0 - 5 %   Eosinophils Absolute 0.1  0.0 - 0.7 K/uL   Basophils Relative 0  0 - 1 %   Basophils Absolute 0.0  0.0 - 0.1 K/uL  COMPREHENSIVE METABOLIC PANEL      Result Value Ref Range   Sodium 131 (*) 137 - 147 mEq/L   Potassium 5.2  3.7 - 5.3 mEq/L   Chloride 98  96 - 112 mEq/L   CO2 24  19 - 32 mEq/L   Glucose, Bld 96  70 - 99 mg/dL   BUN 31 (*) 6 - 23 mg/dL   Creatinine, Ser 1.93 (*) 0.50 - 1.35 mg/dL   Calcium 8.3 (*) 8.4 - 10.5 mg/dL   Total Protein 13.8 (*) 6.0 - 8.3 g/dL   Albumin 2.5 (*) 3.5 - 5.2 g/dL   AST 18  0 - 37 U/L   ALT 5  0 - 53 U/L   Alkaline Phosphatase 74  39 - 117 U/L   Total Bilirubin 1.0  0.3 - 1.2 mg/dL   GFR calc non Af Amer 30 (*) >90 mL/min   GFR calc Af Amer 34 (*) >90 mL/min  TROPONIN I      Result Value Ref Range   Troponin I <0.30  <0.30 ng/mL  URINALYSIS, ROUTINE W REFLEX MICROSCOPIC      Result Value Ref Range   Color, Urine YELLOW  YELLOW   APPearance CLOUDY (*) CLEAR   Specific Gravity, Urine 1.017  1.005 - 1.030   pH 5.0  5.0 - 8.0   Glucose, UA NEGATIVE  NEGATIVE mg/dL   Hgb urine dipstick MODERATE (*) NEGATIVE   Bilirubin Urine NEGATIVE  NEGATIVE   Ketones, ur NEGATIVE  NEGATIVE mg/dL   Protein, ur 30 (*) NEGATIVE mg/dL   Urobilinogen, UA 1.0  0.0 - 1.0 mg/dL   Nitrite NEGATIVE  NEGATIVE   Leukocytes, UA NEGATIVE  NEGATIVE  PROTIME-INR      Result Value Ref Range   Prothrombin Time 15.5 (*) 11.6 - 15.2 seconds   INR 1.26  0.00 - 1.49  MAGNESIUM      Result Value Ref Range   Magnesium 1.9  1.5 - 2.5 mg/dL    PHOSPHORUS      Result Value  Ref Range   Phosphorus 3.8  2.3 - 4.6 mg/dL  URINE MICROSCOPIC-ADD ON      Result Value Ref Range   RBC / HPF 3-6  <3 RBC/hpf   Casts HYALINE CASTS (*) NEGATIVE   Urine-Other MUCOUS PRESENT    POCT I-STAT TROPONIN I      Result Value Ref Range   Troponin i, poc 0.02  0.00 - 0.08 ng/mL   Comment 3               Assessment & Plan:  Paper chart reviewed.  Problem List Items Addressed This Visit   Atypical nevus     Have placed urgent referral to derm for eval for possible melanoma.    Relevant Orders      Ambulatory referral to Dermatology   Chronic kidney disease     Last serum Cr 2008 1.8, anticipate more of a chronic disease process.  Discussed importance of hydration status.    Relevant Orders      TSH   Elevated serum protein level     Check SPEP /UPEP today.    Relevant Orders      Comprehensive metabolic panel      SPEP & IFE with QIG      Protein Electrophoresis, Urine Rflx.   Generalized lymphadenopathy - Primary     New over last 1 month.  ?sarcoid vs more ominous disease process.  Discussed possibility of malignancy and recommended further evaluation.   If blood work unrevealing, will refer for LN biopsy. Reassuringly has not noticed significant weight loss and denies constitutional sxs.    Macrocytic anemia     Further eval with blood work today.    Relevant Orders      Vitamin B12      Ferritin      IBC panel      Folate      Pathologist smear review   Near syncope     Presumed from dehydration.  Continues tachycardic.  Doubt cardiac involvement of sarcoid currently.    Sarcoidosis     Paper chart reviewed today - sarcoidosis dx present since 2008 but unclear how this was diagnosed    Relevant Orders      Comprehensive metabolic panel   Skin rash     Chest rash - erythroderma like picture, will appreciate derm eval.  In process of further evaluating recent lab abnormalities with SPEP/UPEP, periph smear, and TFTs.   Doubt autoimmune.  Will need to r/o lymphoma. Lower extremity rash - ?petechia, recent plt count normal.  Not on meds that could cause vasculitic rash.    Relevant Orders      Ambulatory referral to Dermatology       Follow up plan: Return in about 3 weeks (around 12/01/2013), or if symptoms worsen or fail to improve, for follow up visit.

## 2013-11-10 NOTE — Assessment & Plan Note (Signed)
Last serum Cr 2008 1.8, anticipate more of a chronic disease process.  Discussed importance of hydration status.

## 2013-11-10 NOTE — Assessment & Plan Note (Signed)
Presumed from dehydration.  Continues tachycardic.  Doubt cardiac involvement of sarcoid currently.

## 2013-11-10 NOTE — Assessment & Plan Note (Signed)
New over last 1 month.  ?sarcoid vs more ominous disease process.  Discussed possibility of malignancy and recommended further evaluation.   If blood work unrevealing, will refer for LN biopsy. Reassuringly has not noticed significant weight loss and denies constitutional sxs.

## 2013-11-10 NOTE — Assessment & Plan Note (Signed)
Paper chart reviewed today - sarcoidosis dx present since 2008 but unclear how this was diagnosed

## 2013-11-10 NOTE — Assessment & Plan Note (Signed)
Have placed urgent referral to derm for eval for possible melanoma.

## 2013-11-10 NOTE — Progress Notes (Signed)
Pre-visit discussion using our clinic review tool. No additional management support is needed unless otherwise documented below in the visit note.  

## 2013-11-10 NOTE — Assessment & Plan Note (Signed)
Further eval with blood work today.

## 2013-11-10 NOTE — Assessment & Plan Note (Addendum)
Chest rash - erythroderma like picture, will appreciate derm eval.  In process of further evaluating recent lab abnormalities with SPEP/UPEP, periph smear, and TFTs.  Doubt autoimmune.  Will need to r/o lymphoma. Lower extremity rash - ?petechia, recent plt count normal.  Not on meds that could cause vasculitic rash.

## 2013-11-10 NOTE — Patient Instructions (Addendum)
Blood work today.  We will call you with results. Pass by Marion's office for referral to dermatologist this week.  Sarcoidosis, Schaumann's Disease, Sarcoid of [place] Sarcoidosis appears briefly and heals naturally in 65 to 70 percent of cases, often without the patient knowing or doing anything about it. 20 to 30 percent of patients with sarcoidosis are left with some permanent lung damage. In 10 to 15 percent of the patients, sarcoidosis can become chronic (long lasting). When either the granulomas or fibrosis seriously affect the function of a vital organ (lungs, heart, nervous system, liver, or kidneys), sarcoidosis can be fatal. This occurs 5 to 10 percent of the time. No one can predict how sarcoidosis will progress in an individual patient. The symptoms the patient experiences, the caregiver's findings, and the patient's race can give some clues. Sarcoidosis was once considered a rare disease. We now know that it is a common chronic illness that appears all over the world. It is the most common of the fibrotic (scarring) lung disorders. Anyone can get sarcoidosis. It occurs in all races and in both sexes. The risk is greater if you are a young black adult, especially a black woman, or are of [place], [place], [place], or [place] origin. In sarcoidosis, small lumps (also called nodules or granulomas) develop in multiple organs of the body. These granulomas are small collections of inflamed cells. They commonly appear in the lungs. This is the most common organ affected. They also occur in the lymph nodes (your glands), skin, liver, and eyes. The granulomas vary in the amount of disease they produce from very little with no problems (symptoms) to causing severe illness. The cause of sarcoidosis is not known. It may be due to an abnormal immune reaction in the body. Most people will recover. A few people will develop long lasting conditions that may get worse. Women are affected more often than men.  The majority of those affected are under 49 years of age. Because we do not know the cause, we do not have ways to prevent it. SYMPTOMS   Fever.  Loss of appetite.  Night sweats.  Joint pain.  Aching muscles Symptoms vary because the disease affects different parts of the body in different people. Most people who see their caregiver with sarcoidosis have lung problems. The first signs are usually a dry cough and shortness of breath. There may also be wheezing, chest pain, or a cough that brings up bloody mucus. In severe cases, lung function may become so poor that the person cannot perform even the simple routine tasks of daily life. Other symptoms of sarcoidosis are less common than lung symptoms. They can include:  Skin symptoms. Sarcoidosis can appear as a collection of tender, red bumps called erythema nodosum. These bumps usually occur on the face, shins, and arms. They can also occur as a scaly, purplish discoloration on the nose, cheeks, and ears. This is called lupus pernio. Less often, sarcoidosis causes cysts, pimples, or disfiguring over growths of skin. In many cases, the disfiguring over growths develop in areas of scars or tattoos.  Eye symptoms. These include redness, eye pain, and sensitivity to light.  Heart symptoms. These include irregular heartbeat and heart failure.  Other symptoms. A person may have paralyzed facial muscles, seizures, psychiatric symptoms, swollen salivary glands, or bone pain. DIAGNOSIS  Even when there are no symptoms, your caregiver can sometimes pick up signs of sarcoidosis during a routine examination, usually through a chest x-ray or when checking other  complaints. The patient's age and race or ethnic group can raise an additional red flag that a sign or symptom could be related to sarcoidosis.   Enlargement of the salivary or tear glands and cysts in bone tissue may also be caused by sarcoidosis.  You may have had a biopsy done that shows  signs of sarcoidosis. A biopsy is a small tissue sample that is removed for laboratory testing. This tissue sample can be taken from your lung, skin, lip, or another inflamed or abnormal area of the body.  You may have had an abnormal chest X-ray. Although you appear healthy, a chest X-ray ordered for other reasons may turn up abnormalities that suggest sarcoidosis.  Other tests may be needed. These tests may be done to rule out other illnesses or to determine the amount of organ damage caused by sarcoidosis. Some of the most common tests are:  Blood levels of calcium or angiotensin-converting enzyme may be high in people with sarcoidosis.  Blood tests to evaluate how well your liver is functioning.  Lung function tests to measure how well you are breathing.  A complete eye examination. TREATMENT  If sarcoidosis does not cause any problems, treatment may not be necessary. Your caregiver may decide to simply monitor your condition. As part of this monitoring process, you may have frequent office visits, follow-up chest X-rays, and tests of your lung function.If you have signs of moderate or severe lung disease, your doctor may recommend:  A corticosteroid drug, such as prednisone (sold under several brand names).  Corticosteroids also are used to treat sarcoidosis of the eyes, joints, skin, nerves, or heart.  Corticosteroid eye drops may be used for the eyes.  Over-the-counter medications like nonsteroidal anti-inflammatory drugs (NSAID) often are used to treat joint pain first before corticosteroids, which tend to have more side effects.  If corticosteroids are not effective or cause serious side effects, other drugs that alter or suppress the immune system may be used.  In rare cases, when sarcoidosis causes life-threatening lung disease, a lung transplant may be necessary. However, there is some risk that the new lungs also will be attacked by sarcoidosis. SEEK IMMEDIATE MEDICAL CARE IF:    You suffer from shortness of breath or a lingering cough.  You develop new problems that may be related to the disease. Remember this disease can affect almost all organs of the body and cause many different problems. Document Released: 07/17/2004 Document Revised: 12/09/2011 Document Reviewed: 12/25/2005 [facility] Patient Information 2014 [place].

## 2013-11-10 NOTE — Assessment & Plan Note (Signed)
Check SPEP /UPEP today.

## 2013-11-11 LAB — PATHOLOGIST SMEAR REVIEW

## 2013-11-12 DIAGNOSIS — C4330 Malignant melanoma of unspecified part of face: Secondary | ICD-10-CM | POA: Diagnosis not present

## 2013-11-12 DIAGNOSIS — D485 Neoplasm of uncertain behavior of skin: Secondary | ICD-10-CM | POA: Diagnosis not present

## 2013-11-12 DIAGNOSIS — L905 Scar conditions and fibrosis of skin: Secondary | ICD-10-CM | POA: Diagnosis not present

## 2013-11-12 DIAGNOSIS — L570 Actinic keratosis: Secondary | ICD-10-CM | POA: Diagnosis not present

## 2013-11-12 DIAGNOSIS — L821 Other seborrheic keratosis: Secondary | ICD-10-CM | POA: Diagnosis not present

## 2013-11-15 LAB — SPEP & IFE WITH QIG
ALBUMIN ELP: 24.7 % — AB (ref 55.8–66.1)
Alpha-1-Globulin: 3.4 % (ref 2.9–4.9)
Alpha-2-Globulin: 6.4 % — ABNORMAL LOW (ref 7.1–11.8)
Beta 2: 3.7 % (ref 3.2–6.5)
Beta Globulin: 4 % — ABNORMAL LOW (ref 4.7–7.2)
Gamma Globulin: 57.8 % — ABNORMAL HIGH (ref 11.1–18.8)
IGA: 648 mg/dL — AB (ref 68–379)
IGM, SERUM: 331 mg/dL — AB (ref 41–251)
IgG (Immunoglobin G), Serum: 6960 mg/dL — ABNORMAL HIGH (ref 650–1600)
Total Protein, Serum Electrophoresis: 11.4 g/dL — ABNORMAL HIGH (ref 6.0–8.3)

## 2013-11-16 LAB — IMMUNOFIXATION INTE

## 2013-11-16 LAB — PROTEIN ELECTROPHORESIS, URINE REFLEX
Albumin: 12.2 %
Alpha-1-Globulin, U: 15.9 %
Alpha-2-Globulin, U: 8.6 %
Beta Globulin, U: 30.4 %
Gamma Globulin, U: 32.9 %
TOTAL PROTEIN, URINE: 68 mg/dL

## 2013-11-19 ENCOUNTER — Telehealth: Payer: Self-pay | Admitting: Family Medicine

## 2013-11-19 ENCOUNTER — Other Ambulatory Visit: Payer: Self-pay | Admitting: Family Medicine

## 2013-11-19 DIAGNOSIS — D472 Monoclonal gammopathy: Secondary | ICD-10-CM | POA: Insufficient documentation

## 2013-11-19 NOTE — Telephone Encounter (Signed)
Advised patient that I would ask Dr. Darnell Level and let him know. Also he said he still very weak and asked if the elevated protein would be making him feel that way. He said that any little activity just wears him out. I did tell him that the low B12 would make him feel fatigued, and that it may take some time to get that back to a normal level but that I would ask about the protein.

## 2013-11-19 NOTE — Telephone Encounter (Signed)
I recommend B12 shots as oral will not be absorbed as well. Fatigue can be from from B12 or can be from abnormal blood process that is causing protein levels to be elevated. Will route to [facility] to try and expedite referral.

## 2013-11-19 NOTE — Telephone Encounter (Signed)
Patient notified

## 2013-11-19 NOTE — Telephone Encounter (Signed)
Pt son, [name], called to ask if it was ok to take B-12 supplements before getting injection on 12/08/13? Please try home number 1st and then cell phone.

## 2013-11-21 ENCOUNTER — Encounter: Payer: Self-pay | Admitting: Family Medicine

## 2013-11-24 ENCOUNTER — Ambulatory Visit (INDEPENDENT_AMBULATORY_CARE_PROVIDER_SITE_OTHER): Payer: Medicare Other | Admitting: *Deleted

## 2013-11-24 ENCOUNTER — Telehealth: Payer: Self-pay | Admitting: *Deleted

## 2013-11-24 DIAGNOSIS — E538 Deficiency of other specified B group vitamins: Secondary | ICD-10-CM | POA: Diagnosis not present

## 2013-11-24 MED ORDER — CYANOCOBALAMIN 1000 MCG/ML IJ SOLN
1000.0000 ug | Freq: Once | INTRAMUSCULAR | Status: AC
  Administered 2013-11-24: 1000 ug via INTRAMUSCULAR

## 2013-11-24 NOTE — Telephone Encounter (Signed)
Ok to take calomine lotion and OTC benadryl but caution with sedation given age. May recommend biopsy at f/u visit.

## 2013-11-24 NOTE — Telephone Encounter (Signed)
Patient came in for B12 injection today. He mentioned that his rash has become more bothersome (itchy) and may have spread some more on his chest area. He has been using OTC cortisone cream which hasn't seemed to help much. He wanted to know what else he could try OTC to help with the itch. I advised to try OTC benadryl orally and then he could also try calamine lotion if he wanted to to see if that helped. Otherwise, just try the benadryl. He verbalized understanding. He has a follow up with you next week but didn't know if he should do something different in the meantime.

## 2013-12-01 ENCOUNTER — Ambulatory Visit: Payer: Self-pay | Admitting: Hematology and Oncology

## 2013-12-01 ENCOUNTER — Encounter: Payer: Self-pay | Admitting: Family Medicine

## 2013-12-01 ENCOUNTER — Ambulatory Visit (INDEPENDENT_AMBULATORY_CARE_PROVIDER_SITE_OTHER): Payer: Medicare Other | Admitting: Family Medicine

## 2013-12-01 VITALS — BP 124/82 | HR 88 | Temp 97.9°F | Wt 143.2 lb

## 2013-12-01 DIAGNOSIS — R21 Rash and other nonspecific skin eruption: Secondary | ICD-10-CM

## 2013-12-01 DIAGNOSIS — D472 Monoclonal gammopathy: Secondary | ICD-10-CM

## 2013-12-01 DIAGNOSIS — Z5111 Encounter for antineoplastic chemotherapy: Secondary | ICD-10-CM | POA: Diagnosis not present

## 2013-12-01 DIAGNOSIS — C9110 Chronic lymphocytic leukemia of B-cell type not having achieved remission: Secondary | ICD-10-CM | POA: Diagnosis not present

## 2013-12-01 DIAGNOSIS — D531 Other megaloblastic anemias, not elsewhere classified: Secondary | ICD-10-CM

## 2013-12-01 DIAGNOSIS — R599 Enlarged lymph nodes, unspecified: Secondary | ICD-10-CM | POA: Diagnosis not present

## 2013-12-01 DIAGNOSIS — D239 Other benign neoplasm of skin, unspecified: Secondary | ICD-10-CM

## 2013-12-01 DIAGNOSIS — D518 Other vitamin B12 deficiency anemias: Secondary | ICD-10-CM

## 2013-12-01 DIAGNOSIS — D229 Melanocytic nevi, unspecified: Secondary | ICD-10-CM

## 2013-12-01 DIAGNOSIS — D869 Sarcoidosis, unspecified: Secondary | ICD-10-CM | POA: Diagnosis not present

## 2013-12-01 DIAGNOSIS — R591 Generalized enlarged lymph nodes: Secondary | ICD-10-CM

## 2013-12-01 DIAGNOSIS — Z7982 Long term (current) use of aspirin: Secondary | ICD-10-CM | POA: Diagnosis not present

## 2013-12-01 DIAGNOSIS — Z79899 Other long term (current) drug therapy: Secondary | ICD-10-CM | POA: Diagnosis not present

## 2013-12-01 LAB — BASIC METABOLIC PANEL
Anion Gap: 0 — ABNORMAL LOW (ref 7–16)
BUN: 29 mg/dL — ABNORMAL HIGH (ref 7–18)
Calcium, Total: 8.3 mg/dL — ABNORMAL LOW (ref 8.5–10.1)
Chloride: 101 mmol/L (ref 98–107)
Co2: 29 mmol/L (ref 21–32)
Creatinine: 2.04 mg/dL — ABNORMAL HIGH (ref 0.60–1.30)
EGFR (African American): 33 — ABNORMAL LOW
GFR CALC NON AF AMER: 28 — AB
Glucose: 96 mg/dL (ref 65–99)
Osmolality: 266 (ref 275–301)
Potassium: 4.8 mmol/L (ref 3.5–5.1)
Sodium: 130 mmol/L — ABNORMAL LOW (ref 136–145)

## 2013-12-01 LAB — CBC CANCER CENTER
Basophil #: 0 x10 3/mm (ref 0.0–0.1)
Basophil %: 0.5 %
EOS PCT: 2 %
Eosinophil #: 0.1 x10 3/mm (ref 0.0–0.7)
HCT: 28.9 % — ABNORMAL LOW (ref 40.0–52.0)
HGB: 10.1 g/dL — AB (ref 13.0–18.0)
LYMPHS PCT: 40.3 %
Lymphocyte #: 2.5 x10 3/mm (ref 1.0–3.6)
MCH: 35 pg — ABNORMAL HIGH (ref 26.0–34.0)
MCHC: 34.9 g/dL (ref 32.0–36.0)
MCV: 100 fL (ref 80–100)
Monocyte #: 0.1 x10 3/mm — ABNORMAL LOW (ref 0.2–1.0)
Monocyte %: 1.7 %
NEUTROS ABS: 3.4 x10 3/mm (ref 1.4–6.5)
Neutrophil %: 55.5 %
PLATELETS: 190 x10 3/mm (ref 150–440)
RBC: 2.88 10*6/uL — ABNORMAL LOW (ref 4.40–5.90)
RDW: 14.5 % (ref 11.5–14.5)
WBC: 6.1 x10 3/mm (ref 3.8–10.6)

## 2013-12-01 MED ORDER — VALACYCLOVIR HCL 1 G PO TABS: 1000.0000 mg | ORAL_TABLET | Freq: Three times a day (TID) | ORAL | Status: DC

## 2013-12-01 NOTE — Assessment & Plan Note (Addendum)
To be evaluated later today by heme at [facility].  Appreciate their assistance. ?h/o sarcoid, ?myelodysplastic syndrome. I 've printed all recent blood work and asked pt to take with him to appt with heme today.

## 2013-12-01 NOTE — Assessment & Plan Note (Signed)
Started B12 shots last month.  Pt endorses slight improvement in fatigue.

## 2013-12-01 NOTE — Assessment & Plan Note (Signed)
Chest rash - unchanged.  ?erythroderma type rash.  Consider topical steroid if unchanged vs biopsy.  This was not evaluated by derm on recent visit. L lateral lower leg blistering rash - in dermatomal distribution.  ?varicella zoster - obtained viral culture today from clear serous fluid from blister.  Start valtrex 1gm tid 7d course.

## 2013-12-01 NOTE — Assessment & Plan Note (Addendum)
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

## 2013-12-01 NOTE — Progress Notes (Addendum)
BP 124/82  Pulse 88  Temp(Src) 97.9 F (36.6 C) (Oral)  Wt 143 lb 4 oz (64.978 kg)   CC: f/u 3 wk  Subjective:    Patient ID: Harold Chavez, male    DOB: 1924-02-10, 80 y.o.   MRN: [id number]  HPI: Harold Chavez is a 80 y.o. male presenting on 12/01/2013 with Follow-up and Rash   [name] presents today with his son. See prior note for details.  Briefly, unexplained generalized LAD in setting of ?h/o sarcoidosis, workup revealed abnormal protein electrophoresis with IgG gammopathy, low B12 and chronic kidney disease stage 4 with baseline Cr ~1.8. Has started B12 shot 10/2013.  Feels fatigue may be improving some (started feeling better as of yesterday).  Referred to derm - lentigo maligna on right check pending Mohs at [facility] (12/22/2013).  Has appt later today with hematology (Dr. Kallie Edward).  Appreciate assistance.  L leg rash has worsened - now blistering.  Somewhat tender, worse with walking.  No burning or paresthesias associated with this rash.  Persistent unchanged chest rash.  + h/o shingles 2010  Relevant past medical, surgical, family and social history reviewed and updated as indicated.  Allergies and medications reviewed and updated. Current Outpatient Prescriptions on File Prior to Visit  Medication Sig  . aspirin 81 MG tablet Take 81 mg by mouth daily.  . cyanocobalamin (,VITAMIN B-12,) 1000 MCG/ML injection Inject 1 mL (1,000 mcg total) into the muscle every 30 (thirty) days. Start 10/2013   No current facility-administered medications on file prior to visit.    Review of Systems Per HPI unless specifically indicated above    Objective:    BP 124/82  Pulse 88  Temp(Src) 97.9 F (36.6 C) (Oral)  Wt 143 lb 4 oz (64.978 kg)  Physical Exam  Nursing note and vitals reviewed. Constitutional: He appears well-developed and well-nourished. No distress.  HENT:  Mouth/Throat: Oropharynx is clear and moist. No oropharyngeal exudate.  Cardiovascular: Normal rate,  regular rhythm, normal heart sounds and intact distal pulses.   No murmur heard. Pulmonary/Chest: Effort normal and breath sounds normal. No respiratory distress. He has no wheezes. He has no rales.  Musculoskeletal: He exhibits no edema.  Lymphadenopathy:  persistent generalized LAD  Skin: Rash noted. There is erythema.     Persistent erythematous itchy scaly chest rash in shawl distribution. New blistering rash left lateral lower leg along L5 dermatome superimposed on nonblanching petechial rash, ~1.5cm diameter vesicles with central necrosis.  Blister on posterior heel as well as 2 small blisters on sole   Results for orders placed in visit on 11/10/13  COMPREHENSIVE METABOLIC PANEL      Result Value Ref Range   Sodium 132 (*) 135 - 145 mEq/L   Potassium 4.4  3.5 - 5.1 mEq/L   Chloride 103  96 - 112 mEq/L   CO2 26  19 - 32 mEq/L   Glucose, Bld 104 (*) 70 - 99 mg/dL   BUN 19  6 - 23 mg/dL   Creatinine, Ser 1.7 (*) 0.4 - 1.5 mg/dL   Total Bilirubin 0.9  0.3 - 1.2 mg/dL   Alkaline Phosphatase 65  39 - 117 U/L   AST 18  0 - 37 U/L   ALT 9  0 - 53 U/L   Total Protein 11.5 (*) 6.0 - 8.3 g/dL   Albumin 2.4 (*) 3.5 - 5.2 g/dL   Calcium 8.2 (*) 8.4 - 10.5 mg/dL   GFR 40.14 (*) >60.00 mL/min  TSH      Result Value Ref Range   TSH 3.00  0.35 - 5.50 uIU/mL  VITAMIN B12      Result Value Ref Range   Vitamin B-12 164 (*) 211 - 911 pg/mL  FERRITIN      Result Value Ref Range   Ferritin 111.8  22.0 - 322.0 ng/mL  IBC PANEL      Result Value Ref Range   Iron 43  42 - 165 ug/dL   Transferrin 148.5 (*) 212.0 - 360.0 mg/dL   Saturation Ratios 20.7  20.0 - 50.0 %  FOLATE      Result Value Ref Range   Folate 10.5  >5.9 ng/mL  PATHOLOGIST SMEAR REVIEW      Result Value Ref Range   Path Review      SPEP & IFE WITH QIG      Result Value Ref Range   IgG (Immunoglobin G), Serum 6960 (*) 650 - 1600 mg/dL   IgA 648 (*) 68 - 379 mg/dL   IgM, Serum 331 (*) 41 - 251 mg/dL   Immunofix Electr  Int       Total Protein, Serum Electrophoresis 11.4 (*) 6.0 - 8.3 g/dL   Albumin ELP 24.7 (*) 55.8 - 66.1 %   Alpha-1-Globulin 3.4  2.9 - 4.9 %   Alpha-2-Globulin 6.4 (*) 7.1 - 11.8 %   Beta Globulin 4.0 (*) 4.7 - 7.2 %   Beta 2 3.7  3.2 - 6.5 %   Gamma Globulin 57.8 (*) 11.1 - 18.8 %   M-Spike, % NOT DET     SPE Interp.       COMMENT (PROTEIN ELECTROPHOR)      PROTEIN ELECTROPHORESIS, URINE REFLEX      Result Value Ref Range   Total Protein, Urine 68     Total Protein, Urine/Day NOT CALC  50 - 100 mg/day   Albumin 12.2     Alpha-1-Globulin, U 15.9     Alpha-2-Globulin, U 8.6     Beta Globulin, U 30.4     Gamma Globulin, U 32.9     Monoclonal Band 1 NONE DET     Monoclonal Band 2 NONE DET     Interpretation      IMMUNOFIXATION INTE      Result Value Ref Range   Interpretation          Assessment & Plan:   Problem List Items Addressed This Visit   Generalized lymphadenopathy     Persistent.  Pending eval by heme.  May need LN biopsy.    IgG gammopathy     To be evaluated later today by heme at [facility].  Appreciate their assistance. ?h/o sarcoid, ?myelodysplastic syndrome. I 've printed all recent blood work and asked pt to take with him to appt with heme today.    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  Pt endorses slight improvement in fatigue.    Skin rash - Primary     Chest rash - unchanged.  ?erythroderma type rash.  Consider topical steroid if unchanged vs biopsy.  This was not evaluated by derm on recent visit. L lateral lower leg blistering rash - in dermatomal distribution.  ?varicella zoster - obtained viral culture today from clear serous fluid from  blister.  Start valtrex 1gm tid 7d course.    Relevant Orders      Viral culture       Follow up plan: Return as  needed.

## 2013-12-01 NOTE — Assessment & Plan Note (Signed)
Persistent.  Pending eval by heme.  May need LN biopsy.

## 2013-12-01 NOTE — Progress Notes (Signed)
Pre visit review using our clinic review tool, if applicable. No additional management support is needed unless otherwise documented below in the visit note. 

## 2013-12-01 NOTE — Patient Instructions (Signed)
This rash may be shingles - start valtrex three times daily for 7 days. Keep appointment with hematologist. I've printed out latest blood work to take to today's appointment.

## 2013-12-03 LAB — URINE IEP, RANDOM

## 2013-12-03 LAB — KAPPA/LAMBDA FREE LIGHT CHAINS (ARMC)

## 2013-12-03 LAB — PROT IMMUNOELECTROPHORES(ARMC)

## 2013-12-06 ENCOUNTER — Other Ambulatory Visit: Payer: PRIVATE HEALTH INSURANCE

## 2013-12-06 ENCOUNTER — Encounter: Payer: Self-pay | Admitting: General Surgery

## 2013-12-06 ENCOUNTER — Other Ambulatory Visit: Payer: Self-pay | Admitting: General Surgery

## 2013-12-06 ENCOUNTER — Ambulatory Visit (INDEPENDENT_AMBULATORY_CARE_PROVIDER_SITE_OTHER): Payer: Medicare Other | Admitting: General Surgery

## 2013-12-06 VITALS — BP 132/68 | HR 79 | Resp 14 | Ht 72.0 in | Wt 143.0 lb

## 2013-12-06 DIAGNOSIS — R599 Enlarged lymph nodes, unspecified: Secondary | ICD-10-CM

## 2013-12-06 DIAGNOSIS — C8584 Other specified types of non-Hodgkin lymphoma, lymph nodes of axilla and upper limb: Secondary | ICD-10-CM | POA: Diagnosis not present

## 2013-12-06 NOTE — Progress Notes (Signed)
A aPatient ID: Harold Chavez, male   DOB: 1923/11/11, 80 y.o.   MRN: [id number]  Chief Complaint  Patient presents with  . Other    lymphoma    HPI Harold Chavez is a 80 y.o. male here today for a evaluation of swollen lymph nodes.Patient states he has been swollen for about three months now. He states in neck and under both arms. Patient had a visit to the ER at the beginning of the month for dehydration. Generalized lymphadenopathy was identified at that time and he was referred to medical oncology. A node biopsy has been requested. The patient is accompanied today by the youngest of his 2 sons. The patient was widowed about 5-6 years ago. He continues to maintain his own home. HPI  Past Medical History  Diagnosis Date  . Benign paroxysmal positional vertigo   . History of shingles 2010  . Sarcoidosis   . Lentigo maligna of right cheek 2015    pending Mohs Allyson Sabal)  . B12 deficiency anemia     Past Surgical History  Procedure Laterality Date  . Testicle removal  1937    right; undescended    Family History  Problem Relation Age of Onset  . Stroke Father     Social History History  Substance Use Topics  . Smoking status: Never Smoker   . Smokeless tobacco: Never Used  . Alcohol Use: No    No Known Allergies  Current Outpatient Prescriptions  Medication Sig Dispense Refill  . aspirin 81 MG tablet Take 81 mg by mouth daily.      . cyanocobalamin (,VITAMIN B-12,) 1000 MCG/ML injection Inject 1 mL (1,000 mcg total) into the muscle every 30 (thirty) days. Start 10/2013      . diphenhydrAMINE (BENADRYL) 25 MG tablet Take 25 mg by mouth daily as needed.      . valACYclovir (VALTREX) 1000 MG tablet Take 1 tablet (1,000 mg total) by mouth 3 (three) times daily.  21 tablet  0   No current facility-administered medications for this visit.    Review of Systems Review of Systems  Constitutional: Negative.   Respiratory: Negative.   Cardiovascular: Positive for leg  swelling. Negative for chest pain and palpitations.    Blood pressure 132/68, pulse 79, resp. rate 14, height 6' (1.829 m), weight 143 lb (64.864 kg).  Physical Exam Physical Exam  Constitutional: He is oriented to person, place, and time. He appears well-developed and well-nourished.  Eyes: Conjunctivae are normal.  Neck: Trachea normal and normal range of motion. Neck supple. No mass present.  Multiple lumps bilateral neck/cervical  Rash over the upper chest wall.    Cardiovascular: Normal rate, regular rhythm and normal heart sounds.   Pulmonary/Chest: Effort normal and breath sounds normal.    Abdominal: Normal appearance and bowel sounds are normal. Hepatosplenomegaly: Mild hepatomegaly, no tenderness. There is no tenderness. No hernia.  Musculoskeletal:       Legs: Lymphadenopathy:       Head (right side): No submental and no submandibular adenopathy present.       Head (left side): No submental and no submandibular adenopathy present.    He has cervical adenopathy.       Right cervical: No superficial cervical and no posterior cervical adenopathy present.      Left cervical: No superficial cervical and no posterior cervical adenopathy present.    He has axillary adenopathy.       Right: Supraclavicular adenopathy present.  Left: Supraclavicular adenopathy present.  At least a dozen lymph nodes involving the right and left anterior cervical chain. Multiple palpable nodes in both supraclavicular spaces. Bulky nodal disease in both axilla.  Neurological: He is alert and oriented to person, place, and time.  Skin: Skin is warm and dry.    Data Reviewed Medical oncology notes.  Assessment    Generalized lymphadenopathy, weight loss.     Plan    The indication for lymph node biopsy was reviewed with the patient and his son. I spoke with the attending pathologist who reported that large core samples would be adequate.  The left axilla was chosen for biopsy after  ultrasound showed multiple nodes, some up to 3 cm in diameter.  10 cc of 0.5% Xylocaine with 0.25% Marcaine with 1-200,000 units of epinephrine was utilized a well-tolerated. Chlor prep was applied to the skin. A 9-gauge Encor device was used and multiple core samples obtained from multiple locations with route the node were obtained. These were placed on a saline dampened sponge, and I hand carried them to pathology.  The patient's wound was closed with benzoin and Steri-Strip followed by a Telfa and Tegaderm dressing. He tolerated the procedure well. An ice pack was provided as well as written instructions.  The patient will be contacted when pathology is available.       Robert Bellow 12/06/2013, 9:53 PM

## 2013-12-08 ENCOUNTER — Ambulatory Visit: Payer: PRIVATE HEALTH INSURANCE

## 2013-12-10 DIAGNOSIS — C9000 Multiple myeloma not having achieved remission: Secondary | ICD-10-CM | POA: Diagnosis not present

## 2013-12-10 DIAGNOSIS — R809 Proteinuria, unspecified: Secondary | ICD-10-CM | POA: Diagnosis not present

## 2013-12-10 DIAGNOSIS — D869 Sarcoidosis, unspecified: Secondary | ICD-10-CM | POA: Diagnosis not present

## 2013-12-10 DIAGNOSIS — C9110 Chronic lymphocytic leukemia of B-cell type not having achieved remission: Secondary | ICD-10-CM | POA: Diagnosis not present

## 2013-12-10 DIAGNOSIS — D72822 Plasmacytosis: Secondary | ICD-10-CM | POA: Diagnosis not present

## 2013-12-10 DIAGNOSIS — R21 Rash and other nonspecific skin eruption: Secondary | ICD-10-CM | POA: Diagnosis not present

## 2013-12-10 DIAGNOSIS — D539 Nutritional anemia, unspecified: Secondary | ICD-10-CM | POA: Diagnosis not present

## 2013-12-10 DIAGNOSIS — R599 Enlarged lymph nodes, unspecified: Secondary | ICD-10-CM | POA: Diagnosis not present

## 2013-12-10 LAB — CBC CANCER CENTER
Bands: 1 %
Blast: 1 %
EOS PCT: 4 %
HCT: 28.6 % — AB (ref 40.0–52.0)
HGB: 9.7 g/dL — AB (ref 13.0–18.0)
Lymphocytes: 16 %
MCH: 34.1 pg — AB (ref 26.0–34.0)
MCHC: 33.8 g/dL (ref 32.0–36.0)
MCV: 101 fL — AB (ref 80–100)
Monocytes: 2 %
OTHER CELLS BLOOD: 9 %
Platelet: 194 x10 3/mm (ref 150–440)
RBC: 2.83 10*6/uL — ABNORMAL LOW (ref 4.40–5.90)
RDW: 14.5 % (ref 11.5–14.5)
SEGMENTED NEUTROPHILS: 49 %
Variant Lymphocyte: 18 %
WBC: 7 x10 3/mm (ref 3.8–10.6)

## 2013-12-13 LAB — VIRAL CULTURE VIRC: Organism ID, Bacteria: NEGATIVE

## 2013-12-14 DIAGNOSIS — B354 Tinea corporis: Secondary | ICD-10-CM | POA: Diagnosis not present

## 2013-12-14 DIAGNOSIS — C9110 Chronic lymphocytic leukemia of B-cell type not having achieved remission: Secondary | ICD-10-CM | POA: Diagnosis not present

## 2013-12-14 DIAGNOSIS — R21 Rash and other nonspecific skin eruption: Secondary | ICD-10-CM | POA: Diagnosis not present

## 2013-12-14 DIAGNOSIS — C9000 Multiple myeloma not having achieved remission: Secondary | ICD-10-CM | POA: Diagnosis not present

## 2013-12-14 DIAGNOSIS — Z79899 Other long term (current) drug therapy: Secondary | ICD-10-CM | POA: Diagnosis not present

## 2013-12-14 DIAGNOSIS — L259 Unspecified contact dermatitis, unspecified cause: Secondary | ICD-10-CM | POA: Diagnosis not present

## 2013-12-14 LAB — CBC CANCER CENTER
BASOS PCT: 0.6 %
Basophil #: 0 x10 3/mm (ref 0.0–0.1)
EOS ABS: 0.1 x10 3/mm (ref 0.0–0.7)
Eosinophil %: 1.8 %
HCT: 29 % — AB (ref 40.0–52.0)
HGB: 9.8 g/dL — ABNORMAL LOW (ref 13.0–18.0)
Lymphocyte #: 2.4 x10 3/mm (ref 1.0–3.6)
Lymphocyte %: 33.4 %
MCH: 34.3 pg — ABNORMAL HIGH (ref 26.0–34.0)
MCHC: 33.8 g/dL (ref 32.0–36.0)
MCV: 102 fL — ABNORMAL HIGH (ref 80–100)
Monocyte #: 0.7 x10 3/mm (ref 0.2–1.0)
Monocyte %: 9.7 %
NEUTROS ABS: 3.9 x10 3/mm (ref 1.4–6.5)
Neutrophil %: 54.5 %
Platelet: 215 x10 3/mm (ref 150–440)
RBC: 2.85 10*6/uL — ABNORMAL LOW (ref 4.40–5.90)
RDW: 15.1 % — AB (ref 11.5–14.5)
WBC: 7.2 x10 3/mm (ref 3.8–10.6)

## 2013-12-14 LAB — BASIC METABOLIC PANEL
ANION GAP: 4 — AB (ref 7–16)
BUN: 37 mg/dL — ABNORMAL HIGH (ref 7–18)
CO2: 27 mmol/L (ref 21–32)
CREATININE: 2.09 mg/dL — AB (ref 0.60–1.30)
Calcium, Total: 8.7 mg/dL (ref 8.5–10.1)
Chloride: 100 mmol/L (ref 98–107)
EGFR (African American): 32 — ABNORMAL LOW
GFR CALC NON AF AMER: 28 — AB
Glucose: 93 mg/dL (ref 65–99)
OSMOLALITY: 271 (ref 275–301)
Potassium: 5 mmol/L (ref 3.5–5.1)
SODIUM: 131 mmol/L — AB (ref 136–145)

## 2013-12-14 LAB — ALBUMIN: ALBUMIN: 2.8 g/dL — AB (ref 3.4–5.0)

## 2013-12-15 ENCOUNTER — Ambulatory Visit: Payer: Self-pay | Admitting: Hematology and Oncology

## 2013-12-15 DIAGNOSIS — R948 Abnormal results of function studies of other organs and systems: Secondary | ICD-10-CM | POA: Diagnosis not present

## 2013-12-15 DIAGNOSIS — C8589 Other specified types of non-Hodgkin lymphoma, extranodal and solid organ sites: Secondary | ICD-10-CM | POA: Diagnosis not present

## 2013-12-15 LAB — PATHOLOGY

## 2013-12-16 ENCOUNTER — Encounter: Payer: Self-pay | Admitting: General Surgery

## 2013-12-17 DIAGNOSIS — C9110 Chronic lymphocytic leukemia of B-cell type not having achieved remission: Secondary | ICD-10-CM | POA: Diagnosis not present

## 2013-12-17 DIAGNOSIS — Z79899 Other long term (current) drug therapy: Secondary | ICD-10-CM | POA: Diagnosis not present

## 2013-12-17 DIAGNOSIS — R21 Rash and other nonspecific skin eruption: Secondary | ICD-10-CM | POA: Diagnosis not present

## 2013-12-21 ENCOUNTER — Encounter: Payer: Self-pay | Admitting: General Surgery

## 2013-12-21 DIAGNOSIS — C4330 Malignant melanoma of unspecified part of face: Secondary | ICD-10-CM | POA: Diagnosis not present

## 2013-12-21 DIAGNOSIS — L570 Actinic keratosis: Secondary | ICD-10-CM | POA: Diagnosis not present

## 2013-12-22 ENCOUNTER — Ambulatory Visit (INDEPENDENT_AMBULATORY_CARE_PROVIDER_SITE_OTHER): Payer: Medicare Other | Admitting: Family Medicine

## 2013-12-22 DIAGNOSIS — C4499 Other specified malignant neoplasm of skin, unspecified: Secondary | ICD-10-CM | POA: Diagnosis not present

## 2013-12-22 DIAGNOSIS — D531 Other megaloblastic anemias, not elsewhere classified: Secondary | ICD-10-CM

## 2013-12-22 DIAGNOSIS — D518 Other vitamin B12 deficiency anemias: Secondary | ICD-10-CM

## 2013-12-22 DIAGNOSIS — C4330 Malignant melanoma of unspecified part of face: Secondary | ICD-10-CM | POA: Diagnosis not present

## 2013-12-22 MED ORDER — CYANOCOBALAMIN 1000 MCG/ML IJ SOLN
1000.0000 ug | Freq: Once | INTRAMUSCULAR | Status: AC
  Administered 2013-12-22: 1000 ug via INTRAMUSCULAR

## 2013-12-29 ENCOUNTER — Ambulatory Visit: Payer: Self-pay | Admitting: Hematology and Oncology

## 2013-12-29 DIAGNOSIS — N289 Disorder of kidney and ureter, unspecified: Secondary | ICD-10-CM | POA: Diagnosis not present

## 2013-12-29 DIAGNOSIS — Z7982 Long term (current) use of aspirin: Secondary | ICD-10-CM | POA: Diagnosis not present

## 2013-12-29 DIAGNOSIS — Z5111 Encounter for antineoplastic chemotherapy: Secondary | ICD-10-CM | POA: Diagnosis not present

## 2013-12-29 DIAGNOSIS — D869 Sarcoidosis, unspecified: Secondary | ICD-10-CM | POA: Diagnosis not present

## 2013-12-29 DIAGNOSIS — C9110 Chronic lymphocytic leukemia of B-cell type not having achieved remission: Secondary | ICD-10-CM | POA: Diagnosis not present

## 2013-12-29 DIAGNOSIS — Z79899 Other long term (current) drug therapy: Secondary | ICD-10-CM | POA: Diagnosis not present

## 2014-01-01 ENCOUNTER — Encounter: Payer: Self-pay | Admitting: Family Medicine

## 2014-01-01 DIAGNOSIS — C9110 Chronic lymphocytic leukemia of B-cell type not having achieved remission: Secondary | ICD-10-CM | POA: Insufficient documentation

## 2014-01-06 DIAGNOSIS — N289 Disorder of kidney and ureter, unspecified: Secondary | ICD-10-CM | POA: Diagnosis not present

## 2014-01-06 DIAGNOSIS — C9110 Chronic lymphocytic leukemia of B-cell type not having achieved remission: Secondary | ICD-10-CM | POA: Diagnosis not present

## 2014-01-06 DIAGNOSIS — Z79899 Other long term (current) drug therapy: Secondary | ICD-10-CM | POA: Diagnosis not present

## 2014-01-06 LAB — CBC CANCER CENTER
Basophil #: 0 x10 3/mm (ref 0.0–0.1)
Basophil %: 0.6 %
EOS ABS: 0.3 x10 3/mm (ref 0.0–0.7)
Eosinophil %: 4.8 %
HCT: 29.8 % — ABNORMAL LOW (ref 40.0–52.0)
HGB: 10 g/dL — ABNORMAL LOW (ref 13.0–18.0)
LYMPHS ABS: 2.2 x10 3/mm (ref 1.0–3.6)
LYMPHS PCT: 42.6 %
MCH: 34.2 pg — AB (ref 26.0–34.0)
MCHC: 33.7 g/dL (ref 32.0–36.0)
MCV: 102 fL — AB (ref 80–100)
MONOS PCT: 6.1 %
Monocyte #: 0.3 x10 3/mm (ref 0.2–1.0)
NEUTROS PCT: 45.9 %
Neutrophil #: 2.4 x10 3/mm (ref 1.4–6.5)
Platelet: 224 x10 3/mm (ref 150–440)
RBC: 2.94 10*6/uL — AB (ref 4.40–5.90)
RDW: 15.6 % — ABNORMAL HIGH (ref 11.5–14.5)
WBC: 5.2 x10 3/mm (ref 3.8–10.6)

## 2014-01-06 LAB — COMPREHENSIVE METABOLIC PANEL
ALBUMIN: 2.8 g/dL — AB (ref 3.4–5.0)
ALT: 8 U/L — AB (ref 12–78)
ANION GAP: 5 — AB (ref 7–16)
Alkaline Phosphatase: 86 U/L
BUN: 26 mg/dL — AB (ref 7–18)
Bilirubin,Total: 0.4 mg/dL (ref 0.2–1.0)
CO2: 28 mmol/L (ref 21–32)
Calcium, Total: 8.5 mg/dL (ref 8.5–10.1)
Chloride: 103 mmol/L (ref 98–107)
Creatinine: 1.67 mg/dL — ABNORMAL HIGH (ref 0.60–1.30)
EGFR (African American): 42 — ABNORMAL LOW
EGFR (Non-African Amer.): 36 — ABNORMAL LOW
Glucose: 88 mg/dL (ref 65–99)
Osmolality: 276 (ref 275–301)
Potassium: 4.4 mmol/L (ref 3.5–5.1)
SGOT(AST): 16 U/L (ref 15–37)
Sodium: 136 mmol/L (ref 136–145)
Total Protein: 10.8 g/dL — ABNORMAL HIGH (ref 6.4–8.2)

## 2014-01-09 ENCOUNTER — Encounter: Payer: Self-pay | Admitting: Family Medicine

## 2014-01-12 LAB — COMPREHENSIVE METABOLIC PANEL
ALBUMIN: 2.8 g/dL — AB (ref 3.4–5.0)
ALK PHOS: 74 U/L
ALT: 13 U/L (ref 12–78)
ANION GAP: 6 — AB (ref 7–16)
BUN: 34 mg/dL — ABNORMAL HIGH (ref 7–18)
Bilirubin,Total: 0.5 mg/dL (ref 0.2–1.0)
CO2: 28 mmol/L (ref 21–32)
Calcium, Total: 7.7 mg/dL — ABNORMAL LOW (ref 8.5–10.1)
Chloride: 104 mmol/L (ref 98–107)
Creatinine: 1.43 mg/dL — ABNORMAL HIGH (ref 0.60–1.30)
EGFR (African American): 51 — ABNORMAL LOW
GFR CALC NON AF AMER: 44 — AB
GLUCOSE: 106 mg/dL — AB (ref 65–99)
Osmolality: 284 (ref 275–301)
POTASSIUM: 4.9 mmol/L (ref 3.5–5.1)
SGOT(AST): 14 U/L — ABNORMAL LOW (ref 15–37)
Sodium: 138 mmol/L (ref 136–145)
Total Protein: 9.8 g/dL — ABNORMAL HIGH (ref 6.4–8.2)

## 2014-01-12 LAB — CBC CANCER CENTER
BASOS PCT: 0.3 %
Basophil #: 0 x10 3/mm (ref 0.0–0.1)
EOS PCT: 4 %
Eosinophil #: 0.1 x10 3/mm (ref 0.0–0.7)
HCT: 32.7 % — ABNORMAL LOW (ref 40.0–52.0)
HGB: 10.8 g/dL — AB (ref 13.0–18.0)
LYMPHS ABS: 0.3 x10 3/mm — AB (ref 1.0–3.6)
Lymphocyte %: 8 %
MCH: 33.4 pg (ref 26.0–34.0)
MCHC: 33 g/dL (ref 32.0–36.0)
MCV: 101 fL — ABNORMAL HIGH (ref 80–100)
MONO ABS: 0.1 x10 3/mm — AB (ref 0.2–1.0)
Monocyte %: 1.8 %
NEUTROS ABS: 2.8 x10 3/mm (ref 1.4–6.5)
Neutrophil %: 85.9 %
Platelet: 220 x10 3/mm (ref 150–440)
RBC: 3.23 10*6/uL — ABNORMAL LOW (ref 4.40–5.90)
RDW: 15.3 % — ABNORMAL HIGH (ref 11.5–14.5)
WBC: 3.3 x10 3/mm — AB (ref 3.8–10.6)

## 2014-01-25 ENCOUNTER — Ambulatory Visit (INDEPENDENT_AMBULATORY_CARE_PROVIDER_SITE_OTHER): Payer: Medicare Other

## 2014-01-25 DIAGNOSIS — E538 Deficiency of other specified B group vitamins: Secondary | ICD-10-CM

## 2014-01-25 MED ORDER — CYANOCOBALAMIN 1000 MCG/ML IJ SOLN
1000.0000 ug | Freq: Once | INTRAMUSCULAR | Status: AC
  Administered 2014-01-25: 1000 ug via INTRAMUSCULAR

## 2014-01-28 ENCOUNTER — Ambulatory Visit: Payer: Self-pay | Admitting: Hematology and Oncology

## 2014-01-28 DIAGNOSIS — R21 Rash and other nonspecific skin eruption: Secondary | ICD-10-CM | POA: Diagnosis not present

## 2014-01-28 DIAGNOSIS — Z79899 Other long term (current) drug therapy: Secondary | ICD-10-CM | POA: Diagnosis not present

## 2014-01-28 DIAGNOSIS — C9110 Chronic lymphocytic leukemia of B-cell type not having achieved remission: Secondary | ICD-10-CM | POA: Diagnosis not present

## 2014-01-28 DIAGNOSIS — R599 Enlarged lymph nodes, unspecified: Secondary | ICD-10-CM | POA: Diagnosis not present

## 2014-01-28 DIAGNOSIS — Z7982 Long term (current) use of aspirin: Secondary | ICD-10-CM | POA: Diagnosis not present

## 2014-01-28 DIAGNOSIS — Z5111 Encounter for antineoplastic chemotherapy: Secondary | ICD-10-CM | POA: Diagnosis not present

## 2014-01-28 DIAGNOSIS — D869 Sarcoidosis, unspecified: Secondary | ICD-10-CM | POA: Diagnosis not present

## 2014-02-03 DIAGNOSIS — D869 Sarcoidosis, unspecified: Secondary | ICD-10-CM | POA: Diagnosis not present

## 2014-02-03 DIAGNOSIS — C9110 Chronic lymphocytic leukemia of B-cell type not having achieved remission: Secondary | ICD-10-CM | POA: Diagnosis not present

## 2014-02-03 DIAGNOSIS — Z79899 Other long term (current) drug therapy: Secondary | ICD-10-CM | POA: Diagnosis not present

## 2014-02-03 LAB — COMPREHENSIVE METABOLIC PANEL
ALBUMIN: 3.2 g/dL — AB (ref 3.4–5.0)
ALK PHOS: 98 U/L
Anion Gap: 4 — ABNORMAL LOW (ref 7–16)
BUN: 22 mg/dL — ABNORMAL HIGH (ref 7–18)
Bilirubin,Total: 0.4 mg/dL (ref 0.2–1.0)
CHLORIDE: 104 mmol/L (ref 98–107)
CREATININE: 1.35 mg/dL — AB (ref 0.60–1.30)
Calcium, Total: 9 mg/dL (ref 8.5–10.1)
Co2: 30 mmol/L (ref 21–32)
EGFR (African American): 54 — ABNORMAL LOW
EGFR (Non-African Amer.): 47 — ABNORMAL LOW
GLUCOSE: 87 mg/dL (ref 65–99)
OSMOLALITY: 278 (ref 275–301)
Potassium: 5.1 mmol/L (ref 3.5–5.1)
SGOT(AST): 13 U/L — ABNORMAL LOW (ref 15–37)
SGPT (ALT): 12 U/L (ref 12–78)
Sodium: 138 mmol/L (ref 136–145)
Total Protein: 8.7 g/dL — ABNORMAL HIGH (ref 6.4–8.2)

## 2014-02-03 LAB — CBC CANCER CENTER
Basophil #: 0 x10 3/mm (ref 0.0–0.1)
Basophil %: 0.4 %
Eosinophil #: 0.2 x10 3/mm (ref 0.0–0.7)
Eosinophil %: 2.9 %
HCT: 31.4 % — ABNORMAL LOW (ref 40.0–52.0)
HGB: 10.7 g/dL — AB (ref 13.0–18.0)
Lymphocyte #: 0.5 x10 3/mm — ABNORMAL LOW (ref 1.0–3.6)
Lymphocyte %: 8.2 %
MCH: 33.7 pg (ref 26.0–34.0)
MCHC: 34 g/dL (ref 32.0–36.0)
MCV: 99 fL (ref 80–100)
MONO ABS: 0.3 x10 3/mm (ref 0.2–1.0)
Monocyte %: 5.1 %
Neutrophil #: 4.6 x10 3/mm (ref 1.4–6.5)
Neutrophil %: 83.4 %
Platelet: 291 x10 3/mm (ref 150–440)
RBC: 3.17 10*6/uL — ABNORMAL LOW (ref 4.40–5.90)
RDW: 15.2 % — AB (ref 11.5–14.5)
WBC: 5.6 x10 3/mm (ref 3.8–10.6)

## 2014-02-28 ENCOUNTER — Ambulatory Visit: Payer: Self-pay | Admitting: Hematology and Oncology

## 2014-02-28 DIAGNOSIS — Z7982 Long term (current) use of aspirin: Secondary | ICD-10-CM | POA: Diagnosis not present

## 2014-02-28 DIAGNOSIS — N289 Disorder of kidney and ureter, unspecified: Secondary | ICD-10-CM | POA: Diagnosis not present

## 2014-02-28 DIAGNOSIS — C9110 Chronic lymphocytic leukemia of B-cell type not having achieved remission: Secondary | ICD-10-CM | POA: Diagnosis not present

## 2014-02-28 DIAGNOSIS — R599 Enlarged lymph nodes, unspecified: Secondary | ICD-10-CM | POA: Diagnosis not present

## 2014-02-28 DIAGNOSIS — Z5111 Encounter for antineoplastic chemotherapy: Secondary | ICD-10-CM | POA: Diagnosis not present

## 2014-02-28 DIAGNOSIS — D869 Sarcoidosis, unspecified: Secondary | ICD-10-CM | POA: Diagnosis not present

## 2014-02-28 DIAGNOSIS — Z79899 Other long term (current) drug therapy: Secondary | ICD-10-CM | POA: Diagnosis not present

## 2014-03-01 ENCOUNTER — Ambulatory Visit (INDEPENDENT_AMBULATORY_CARE_PROVIDER_SITE_OTHER): Payer: Medicare Other

## 2014-03-01 DIAGNOSIS — E538 Deficiency of other specified B group vitamins: Secondary | ICD-10-CM

## 2014-03-01 MED ORDER — CYANOCOBALAMIN 1000 MCG/ML IJ SOLN
1000.0000 ug | Freq: Once | INTRAMUSCULAR | Status: AC
  Administered 2014-03-01: 1000 ug via INTRAMUSCULAR

## 2014-03-03 DIAGNOSIS — C9110 Chronic lymphocytic leukemia of B-cell type not having achieved remission: Secondary | ICD-10-CM | POA: Diagnosis not present

## 2014-03-03 DIAGNOSIS — Z79899 Other long term (current) drug therapy: Secondary | ICD-10-CM | POA: Diagnosis not present

## 2014-03-03 DIAGNOSIS — N289 Disorder of kidney and ureter, unspecified: Secondary | ICD-10-CM | POA: Diagnosis not present

## 2014-03-03 LAB — COMPREHENSIVE METABOLIC PANEL
AST: 23 U/L (ref 15–37)
Albumin: 3.3 g/dL — ABNORMAL LOW (ref 3.4–5.0)
Alkaline Phosphatase: 100 U/L
Anion Gap: 3 — ABNORMAL LOW (ref 7–16)
BILIRUBIN TOTAL: 0.4 mg/dL (ref 0.2–1.0)
BUN: 23 mg/dL — ABNORMAL HIGH (ref 7–18)
CALCIUM: 8.8 mg/dL (ref 8.5–10.1)
CHLORIDE: 106 mmol/L (ref 98–107)
CO2: 29 mmol/L (ref 21–32)
CREATININE: 1.12 mg/dL (ref 0.60–1.30)
EGFR (African American): >60
EGFR (Non-African Amer.): 59 — ABNORMAL LOW
Glucose: 81 mg/dL (ref 65–99)
Osmolality: 278 (ref 275–301)
Potassium: 4.9 mmol/L (ref 3.5–5.1)
SGPT (ALT): 28 U/L (ref 12–78)
Sodium: 138 mmol/L (ref 136–145)
TOTAL PROTEIN: 8 g/dL (ref 6.4–8.2)

## 2014-03-03 LAB — CBC CANCER CENTER
Basophil #: 0 x10 3/mm (ref 0.0–0.1)
Basophil %: 0.5 %
EOS PCT: 3.3 %
Eosinophil #: 0.2 x10 3/mm (ref 0.0–0.7)
HCT: 37.3 % — ABNORMAL LOW (ref 40.0–52.0)
HGB: 12.6 g/dL — ABNORMAL LOW (ref 13.0–18.0)
LYMPHS PCT: 10.9 %
Lymphocyte #: 0.7 x10 3/mm — ABNORMAL LOW (ref 1.0–3.6)
MCH: 32.9 pg (ref 26.0–34.0)
MCHC: 33.7 g/dL (ref 32.0–36.0)
MCV: 98 fL (ref 80–100)
MONO ABS: 0.7 x10 3/mm (ref 0.2–1.0)
Monocyte %: 9.8 %
Neutrophil #: 5.1 x10 3/mm (ref 1.4–6.5)
Neutrophil %: 75.5 %
Platelet: 294 x10 3/mm (ref 150–440)
RBC: 3.82 10*6/uL — ABNORMAL LOW (ref 4.40–5.90)
RDW: 14 % (ref 11.5–14.5)
WBC: 6.8 x10 3/mm (ref 3.8–10.6)

## 2014-03-30 ENCOUNTER — Ambulatory Visit: Payer: Self-pay | Admitting: Hematology and Oncology

## 2014-03-30 DIAGNOSIS — R809 Proteinuria, unspecified: Secondary | ICD-10-CM | POA: Diagnosis not present

## 2014-03-30 DIAGNOSIS — N189 Chronic kidney disease, unspecified: Secondary | ICD-10-CM | POA: Diagnosis not present

## 2014-03-30 DIAGNOSIS — Z7982 Long term (current) use of aspirin: Secondary | ICD-10-CM | POA: Diagnosis not present

## 2014-03-30 DIAGNOSIS — Z5111 Encounter for antineoplastic chemotherapy: Secondary | ICD-10-CM | POA: Diagnosis not present

## 2014-03-30 DIAGNOSIS — C9110 Chronic lymphocytic leukemia of B-cell type not having achieved remission: Secondary | ICD-10-CM | POA: Diagnosis not present

## 2014-03-30 DIAGNOSIS — Z79899 Other long term (current) drug therapy: Secondary | ICD-10-CM | POA: Diagnosis not present

## 2014-03-30 LAB — CBC CANCER CENTER
BASOS ABS: 0.1 x10 3/mm (ref 0.0–0.1)
BASOS PCT: 0.6 %
EOS PCT: 2.7 %
Eosinophil #: 0.2 x10 3/mm (ref 0.0–0.7)
HCT: 39.5 % — ABNORMAL LOW (ref 40.0–52.0)
HGB: 13.4 g/dL (ref 13.0–18.0)
LYMPHS PCT: 17.9 %
Lymphocyte #: 1.6 x10 3/mm (ref 1.0–3.6)
MCH: 32.8 pg (ref 26.0–34.0)
MCHC: 33.9 g/dL (ref 32.0–36.0)
MCV: 97 fL (ref 80–100)
MONO ABS: 0.9 x10 3/mm (ref 0.2–1.0)
Monocyte %: 10.3 %
NEUTROS ABS: 6 x10 3/mm (ref 1.4–6.5)
Neutrophil %: 68.5 %
Platelet: 246 x10 3/mm (ref 150–440)
RBC: 4.08 10*6/uL — ABNORMAL LOW (ref 4.40–5.90)
RDW: 13.8 % (ref 11.5–14.5)
WBC: 8.8 x10 3/mm (ref 3.8–10.6)

## 2014-03-30 LAB — COMPREHENSIVE METABOLIC PANEL
ALK PHOS: 86 U/L
ALT: 21 U/L (ref 12–78)
ANION GAP: 4 — AB (ref 7–16)
AST: 28 U/L (ref 15–37)
Albumin: 3.5 g/dL (ref 3.4–5.0)
BUN: 26 mg/dL — ABNORMAL HIGH (ref 7–18)
Bilirubin,Total: 0.5 mg/dL (ref 0.2–1.0)
Calcium, Total: 8.8 mg/dL (ref 8.5–10.1)
Chloride: 105 mmol/L (ref 98–107)
Co2: 32 mmol/L (ref 21–32)
Creatinine: 1.28 mg/dL (ref 0.60–1.30)
EGFR (African American): 58 — ABNORMAL LOW
GFR CALC NON AF AMER: 50 — AB
GLUCOSE: 86 mg/dL (ref 65–99)
Osmolality: 285 (ref 275–301)
Potassium: 4.8 mmol/L (ref 3.5–5.1)
Sodium: 141 mmol/L (ref 136–145)
TOTAL PROTEIN: 8 g/dL (ref 6.4–8.2)

## 2014-03-31 LAB — COMP PANEL: LEUKEMIA/LYMPHOMA

## 2014-04-05 ENCOUNTER — Ambulatory Visit: Payer: Medicare Other

## 2014-04-07 ENCOUNTER — Ambulatory Visit (INDEPENDENT_AMBULATORY_CARE_PROVIDER_SITE_OTHER): Payer: Medicare Other

## 2014-04-07 DIAGNOSIS — E538 Deficiency of other specified B group vitamins: Secondary | ICD-10-CM | POA: Diagnosis not present

## 2014-04-07 MED ORDER — CYANOCOBALAMIN 1000 MCG/ML IJ SOLN
1000.0000 ug | Freq: Once | INTRAMUSCULAR | Status: AC
  Administered 2014-04-07: 1000 ug via INTRAMUSCULAR

## 2014-04-12 DIAGNOSIS — Z79899 Other long term (current) drug therapy: Secondary | ICD-10-CM | POA: Diagnosis not present

## 2014-04-12 DIAGNOSIS — C9110 Chronic lymphocytic leukemia of B-cell type not having achieved remission: Secondary | ICD-10-CM | POA: Diagnosis not present

## 2014-04-12 DIAGNOSIS — R809 Proteinuria, unspecified: Secondary | ICD-10-CM | POA: Diagnosis not present

## 2014-04-14 NOTE — Telephone Encounter (Signed)
Closed Encounter  °

## 2014-04-27 DIAGNOSIS — R809 Proteinuria, unspecified: Secondary | ICD-10-CM | POA: Diagnosis not present

## 2014-04-27 DIAGNOSIS — Z79899 Other long term (current) drug therapy: Secondary | ICD-10-CM | POA: Diagnosis not present

## 2014-04-27 DIAGNOSIS — C9110 Chronic lymphocytic leukemia of B-cell type not having achieved remission: Secondary | ICD-10-CM | POA: Diagnosis not present

## 2014-04-27 DIAGNOSIS — N189 Chronic kidney disease, unspecified: Secondary | ICD-10-CM | POA: Diagnosis not present

## 2014-04-27 LAB — CBC CANCER CENTER
Eosinophil: 2 %
HCT: 37.9 % — ABNORMAL LOW (ref 40.0–52.0)
HGB: 12.8 g/dL — ABNORMAL LOW (ref 13.0–18.0)
LYMPHS PCT: 14 %
MCH: 32.4 pg (ref 26.0–34.0)
MCHC: 33.9 g/dL (ref 32.0–36.0)
MCV: 96 fL (ref 80–100)
MONOS PCT: 13 %
Platelet: 254 x10 3/mm (ref 150–440)
RBC: 3.97 10*6/uL — ABNORMAL LOW (ref 4.40–5.90)
RDW: 14.2 % (ref 11.5–14.5)
Segmented Neutrophils: 71 %
WBC: 5.3 x10 3/mm (ref 3.8–10.6)

## 2014-04-27 LAB — COMPREHENSIVE METABOLIC PANEL
ALT: 20 U/L
Albumin: 3.6 g/dL (ref 3.4–5.0)
Alkaline Phosphatase: 93 U/L
Anion Gap: 4 — ABNORMAL LOW (ref 7–16)
BILIRUBIN TOTAL: 0.3 mg/dL (ref 0.2–1.0)
BUN: 30 mg/dL — ABNORMAL HIGH (ref 7–18)
CHLORIDE: 103 mmol/L (ref 98–107)
Calcium, Total: 9.3 mg/dL (ref 8.5–10.1)
Co2: 31 mmol/L (ref 21–32)
Creatinine: 1.34 mg/dL — ABNORMAL HIGH (ref 0.60–1.30)
EGFR (African American): 55 — ABNORMAL LOW
EGFR (Non-African Amer.): 47 — ABNORMAL LOW
Glucose: 86 mg/dL (ref 65–99)
Osmolality: 281 (ref 275–301)
Potassium: 4.9 mmol/L (ref 3.5–5.1)
SGOT(AST): 28 U/L (ref 15–37)
SODIUM: 138 mmol/L (ref 136–145)
TOTAL PROTEIN: 7.7 g/dL (ref 6.4–8.2)

## 2014-04-30 ENCOUNTER — Ambulatory Visit: Payer: Self-pay | Admitting: Hematology and Oncology

## 2014-05-09 ENCOUNTER — Inpatient Hospital Stay: Payer: Self-pay | Admitting: Internal Medicine

## 2014-05-09 ENCOUNTER — Telehealth: Payer: Self-pay | Admitting: Family Medicine

## 2014-05-09 ENCOUNTER — Ambulatory Visit: Payer: Medicare Other | Admitting: Family Medicine

## 2014-05-09 DIAGNOSIS — J189 Pneumonia, unspecified organism: Secondary | ICD-10-CM | POA: Diagnosis not present

## 2014-05-09 DIAGNOSIS — J4489 Other specified chronic obstructive pulmonary disease: Secondary | ICD-10-CM | POA: Diagnosis not present

## 2014-05-09 DIAGNOSIS — J449 Chronic obstructive pulmonary disease, unspecified: Secondary | ICD-10-CM | POA: Diagnosis not present

## 2014-05-09 DIAGNOSIS — R404 Transient alteration of awareness: Secondary | ICD-10-CM | POA: Diagnosis not present

## 2014-05-09 DIAGNOSIS — Z5189 Encounter for other specified aftercare: Secondary | ICD-10-CM | POA: Diagnosis not present

## 2014-05-09 DIAGNOSIS — D702 Other drug-induced agranulocytosis: Secondary | ICD-10-CM | POA: Diagnosis not present

## 2014-05-09 DIAGNOSIS — Z9981 Dependence on supplemental oxygen: Secondary | ICD-10-CM | POA: Diagnosis not present

## 2014-05-09 DIAGNOSIS — N179 Acute kidney failure, unspecified: Secondary | ICD-10-CM | POA: Diagnosis not present

## 2014-05-09 DIAGNOSIS — Z9221 Personal history of antineoplastic chemotherapy: Secondary | ICD-10-CM | POA: Diagnosis not present

## 2014-05-09 DIAGNOSIS — D809 Immunodeficiency with predominantly antibody defects, unspecified: Secondary | ICD-10-CM | POA: Diagnosis not present

## 2014-05-09 DIAGNOSIS — I209 Angina pectoris, unspecified: Secondary | ICD-10-CM | POA: Diagnosis present

## 2014-05-09 DIAGNOSIS — N170 Acute kidney failure with tubular necrosis: Secondary | ICD-10-CM | POA: Diagnosis not present

## 2014-05-09 DIAGNOSIS — J9600 Acute respiratory failure, unspecified whether with hypoxia or hypercapnia: Secondary | ICD-10-CM | POA: Diagnosis not present

## 2014-05-09 DIAGNOSIS — E871 Hypo-osmolality and hyponatremia: Secondary | ICD-10-CM | POA: Diagnosis present

## 2014-05-09 DIAGNOSIS — R6520 Severe sepsis without septic shock: Secondary | ICD-10-CM | POA: Diagnosis present

## 2014-05-09 DIAGNOSIS — R5383 Other fatigue: Secondary | ICD-10-CM | POA: Diagnosis not present

## 2014-05-09 DIAGNOSIS — R5381 Other malaise: Secondary | ICD-10-CM | POA: Diagnosis not present

## 2014-05-09 DIAGNOSIS — R6521 Severe sepsis with septic shock: Secondary | ICD-10-CM | POA: Diagnosis not present

## 2014-05-09 DIAGNOSIS — N189 Chronic kidney disease, unspecified: Secondary | ICD-10-CM | POA: Diagnosis not present

## 2014-05-09 DIAGNOSIS — A4152 Sepsis due to Pseudomonas: Secondary | ICD-10-CM | POA: Diagnosis present

## 2014-05-09 DIAGNOSIS — D709 Neutropenia, unspecified: Secondary | ICD-10-CM | POA: Diagnosis not present

## 2014-05-09 DIAGNOSIS — J9819 Other pulmonary collapse: Secondary | ICD-10-CM | POA: Diagnosis not present

## 2014-05-09 DIAGNOSIS — D72819 Decreased white blood cell count, unspecified: Secondary | ICD-10-CM | POA: Diagnosis not present

## 2014-05-09 DIAGNOSIS — R5081 Fever presenting with conditions classified elsewhere: Secondary | ICD-10-CM | POA: Diagnosis present

## 2014-05-09 DIAGNOSIS — Z856 Personal history of leukemia: Secondary | ICD-10-CM | POA: Diagnosis not present

## 2014-05-09 DIAGNOSIS — D649 Anemia, unspecified: Secondary | ICD-10-CM | POA: Diagnosis present

## 2014-05-09 DIAGNOSIS — K219 Gastro-esophageal reflux disease without esophagitis: Secondary | ICD-10-CM | POA: Diagnosis not present

## 2014-05-09 DIAGNOSIS — Z452 Encounter for adjustment and management of vascular access device: Secondary | ICD-10-CM | POA: Diagnosis not present

## 2014-05-09 DIAGNOSIS — Z7982 Long term (current) use of aspirin: Secondary | ICD-10-CM | POA: Diagnosis not present

## 2014-05-09 DIAGNOSIS — C9110 Chronic lymphocytic leukemia of B-cell type not having achieved remission: Secondary | ICD-10-CM | POA: Diagnosis not present

## 2014-05-09 DIAGNOSIS — N200 Calculus of kidney: Secondary | ICD-10-CM | POA: Diagnosis not present

## 2014-05-09 DIAGNOSIS — N183 Chronic kidney disease, stage 3 unspecified: Secondary | ICD-10-CM | POA: Diagnosis present

## 2014-05-09 DIAGNOSIS — A419 Sepsis, unspecified organism: Secondary | ICD-10-CM | POA: Diagnosis not present

## 2014-05-09 LAB — URINALYSIS, COMPLETE
Bacteria: NONE SEEN
Bilirubin,UR: NEGATIVE
Glucose,UR: NEGATIVE mg/dL (ref 0–75)
Hyaline Cast: 5
Ketone: NEGATIVE
Leukocyte Esterase: NEGATIVE
Nitrite: NEGATIVE
Ph: 5 (ref 4.5–8.0)
Protein: 30
RBC,UR: 1 /HPF (ref 0–5)
Specific Gravity: 1.013 (ref 1.003–1.030)
Squamous Epithelial: 1
WBC UR: 2 /HPF (ref 0–5)

## 2014-05-09 LAB — COMPREHENSIVE METABOLIC PANEL WITH GFR
Albumin: 2.8 g/dL — ABNORMAL LOW (ref 3.4–5.0)
Alkaline Phosphatase: 74 U/L
Anion Gap: 7 (ref 7–16)
BUN: 50 mg/dL — ABNORMAL HIGH (ref 7–18)
Bilirubin,Total: 0.8 mg/dL (ref 0.2–1.0)
Calcium, Total: 7.7 mg/dL — ABNORMAL LOW (ref 8.5–10.1)
Chloride: 103 mmol/L (ref 98–107)
Co2: 25 mmol/L (ref 21–32)
Creatinine: 2.74 mg/dL — ABNORMAL HIGH (ref 0.60–1.30)
EGFR (African American): 23 — ABNORMAL LOW
EGFR (Non-African Amer.): 20 — ABNORMAL LOW
Glucose: 96 mg/dL (ref 65–99)
Osmolality: 283 (ref 275–301)
Potassium: 4.4 mmol/L (ref 3.5–5.1)
SGOT(AST): 33 U/L (ref 15–37)
SGPT (ALT): 29 U/L
Sodium: 135 mmol/L — ABNORMAL LOW (ref 136–145)
Total Protein: 6.8 g/dL (ref 6.4–8.2)

## 2014-05-09 LAB — CBC
HCT: 33.5 % — AB (ref 40.0–52.0)
HGB: 11 g/dL — ABNORMAL LOW (ref 13.0–18.0)
MCH: 31.4 pg (ref 26.0–34.0)
MCHC: 32.8 g/dL (ref 32.0–36.0)
MCV: 96 fL (ref 80–100)
PLATELETS: 205 10*3/uL (ref 150–440)
RBC: 3.5 10*6/uL — ABNORMAL LOW (ref 4.40–5.90)
RDW: 14.6 % — AB (ref 11.5–14.5)
WBC: 0.7 10*3/uL — CL (ref 3.8–10.6)

## 2014-05-09 LAB — TROPONIN I: Troponin-I: <0.02 ng/mL

## 2014-05-09 NOTE — Telephone Encounter (Signed)
Pt's son called and said pt had a 10:15 apptmt, but by the time he got pt to the car, Harold Chavez was so weak he couldn't control his bladder and bowels. Pt's son says he's going to clean him up and then call 9-1-1 to take him to [facility] emergency room. He just wanted to make sure you were aware. Thank you.

## 2014-05-10 LAB — COMPREHENSIVE METABOLIC PANEL
ALK PHOS: 51 U/L
ANION GAP: 9 (ref 7–16)
Albumin: 2 g/dL — ABNORMAL LOW (ref 3.4–5.0)
BUN: 44 mg/dL — ABNORMAL HIGH (ref 7–18)
Bilirubin,Total: 0.5 mg/dL (ref 0.2–1.0)
Calcium, Total: 6.6 mg/dL — CL (ref 8.5–10.1)
Chloride: 109 mmol/L — ABNORMAL HIGH (ref 98–107)
Co2: 22 mmol/L (ref 21–32)
Creatinine: 2.02 mg/dL — ABNORMAL HIGH (ref 0.60–1.30)
EGFR (Non-African Amer.): 29 — ABNORMAL LOW
GFR CALC AF AMER: 33 — AB
Glucose: 99 mg/dL (ref 65–99)
Osmolality: 291 (ref 275–301)
Potassium: 3.9 mmol/L (ref 3.5–5.1)
SGOT(AST): 27 U/L (ref 15–37)
SGPT (ALT): 19 U/L
SODIUM: 140 mmol/L (ref 136–145)
TOTAL PROTEIN: 5.5 g/dL — AB (ref 6.4–8.2)

## 2014-05-10 LAB — LIPID PANEL
Cholesterol: 69 mg/dL (ref 0–200)
HDL: 22 mg/dL — AB (ref 40–60)
LDL CHOLESTEROL, CALC: 32 mg/dL (ref 0–100)
Triglycerides: 77 mg/dL (ref 0–200)
VLDL CHOLESTEROL, CALC: 15 mg/dL (ref 5–40)

## 2014-05-10 LAB — CBC WITH DIFFERENTIAL/PLATELET
Basophil #: 0 10*3/uL (ref 0.0–0.1)
Basophil %: 0.8 %
EOS PCT: 1.2 %
Eosinophil #: 0 10*3/uL (ref 0.0–0.7)
HCT: 28.6 % — AB (ref 40.0–52.0)
HGB: 9.6 g/dL — AB (ref 13.0–18.0)
Lymphocyte #: 0.1 10*3/uL — ABNORMAL LOW (ref 1.0–3.6)
Lymphocyte %: 21.8 %
MCH: 31.6 pg (ref 26.0–34.0)
MCHC: 33.5 g/dL (ref 32.0–36.0)
MCV: 94 fL (ref 80–100)
Monocyte #: 0.4 x10 3/mm (ref 0.2–1.0)
Monocyte %: 64.9 %
NEUTROS ABS: 0.1 10*3/uL — AB (ref 1.4–6.5)
Neutrophil %: 11.3 %
Platelet: 158 10*3/uL (ref 150–440)
RBC: 3.03 10*6/uL — AB (ref 4.40–5.90)
RDW: 14.4 % (ref 11.5–14.5)
WBC: 0.6 10*3/uL — CL (ref 3.8–10.6)

## 2014-05-10 LAB — HEMOGLOBIN A1C: Hemoglobin A1C: 5.2 % (ref 4.2–6.3)

## 2014-05-10 LAB — TSH: Thyroid Stimulating Horm: 0.55 u[IU]/mL

## 2014-05-10 LAB — MAGNESIUM: MAGNESIUM: 1.6 mg/dL — AB

## 2014-05-10 NOTE — Telephone Encounter (Signed)
Message left for patient's son to return my call.

## 2014-05-10 NOTE — Telephone Encounter (Signed)
Noted. plz call for an update.  Looks like he went to [facility] as no records in our system.

## 2014-05-10 NOTE — Telephone Encounter (Signed)
Spoke to pt son [name]. Took pt to [facility] and he has pneumonia and is dehydrated. They put him into ICU and hopefully can move him into regular room either tonight or tomorrow. Pt is feeing better.  Grant call back # [phone]

## 2014-05-11 ENCOUNTER — Ambulatory Visit: Payer: Medicare Other

## 2014-05-11 LAB — CBC WITH DIFFERENTIAL/PLATELET
BASOS ABS: 0 10*3/uL (ref 0.0–0.1)
Basophil %: 0.9 %
EOS ABS: 0.1 10*3/uL (ref 0.0–0.7)
EOS PCT: 9 %
HCT: 31.6 % — ABNORMAL LOW (ref 40.0–52.0)
HGB: 10.8 g/dL — AB (ref 13.0–18.0)
Lymphocyte #: 0.2 10*3/uL — ABNORMAL LOW (ref 1.0–3.6)
Lymphocyte %: 24 %
MCH: 31.9 pg (ref 26.0–34.0)
MCHC: 34 g/dL (ref 32.0–36.0)
MCV: 94 fL (ref 80–100)
MONO ABS: 0.4 x10 3/mm (ref 0.2–1.0)
Monocyte %: 42.1 %
NEUTROS ABS: 0.2 10*3/uL — AB (ref 1.4–6.5)
NEUTROS PCT: 24 %
Platelet: 203 10*3/uL (ref 150–440)
RBC: 3.37 10*6/uL — ABNORMAL LOW (ref 4.40–5.90)
RDW: 14.4 % (ref 11.5–14.5)
WBC: 0.9 10*3/uL — AB (ref 3.8–10.6)

## 2014-05-11 LAB — BASIC METABOLIC PANEL
Anion Gap: 9 (ref 7–16)
BUN: 34 mg/dL — ABNORMAL HIGH (ref 7–18)
CALCIUM: 7.2 mg/dL — AB (ref 8.5–10.1)
CHLORIDE: 111 mmol/L — AB (ref 98–107)
CO2: 22 mmol/L (ref 21–32)
Creatinine: 1.66 mg/dL — ABNORMAL HIGH (ref 0.60–1.30)
EGFR (African American): 42 — ABNORMAL LOW
EGFR (Non-African Amer.): 37 — ABNORMAL LOW
Glucose: 105 mg/dL — ABNORMAL HIGH (ref 65–99)
Osmolality: 291 (ref 275–301)
POTASSIUM: 3.5 mmol/L (ref 3.5–5.1)
SODIUM: 142 mmol/L (ref 136–145)

## 2014-05-11 NOTE — Telephone Encounter (Signed)
Noted  

## 2014-05-12 LAB — CBC WITH DIFFERENTIAL/PLATELET
BASOS PCT: 0.4 %
Basophil #: 0 10*3/uL (ref 0.0–0.1)
EOS ABS: 0.2 10*3/uL (ref 0.0–0.7)
Eosinophil %: 6.2 %
HCT: 33.1 % — ABNORMAL LOW (ref 40.0–52.0)
HGB: 11.1 g/dL — AB (ref 13.0–18.0)
LYMPHS ABS: 0.3 10*3/uL — AB (ref 1.0–3.6)
Lymphocyte %: 7.3 %
MCH: 31.6 pg (ref 26.0–34.0)
MCHC: 33.5 g/dL (ref 32.0–36.0)
MCV: 94 fL (ref 80–100)
MONO ABS: 0.6 x10 3/mm (ref 0.2–1.0)
Monocyte %: 16.9 %
NEUTROS PCT: 69.2 %
Neutrophil #: 2.4 10*3/uL (ref 1.4–6.5)
PLATELETS: 240 10*3/uL (ref 150–440)
RBC: 3.51 10*6/uL — AB (ref 4.40–5.90)
RDW: 14.9 % — AB (ref 11.5–14.5)
WBC: 3.5 10*3/uL — AB (ref 3.8–10.6)

## 2014-05-13 DIAGNOSIS — N179 Acute kidney failure, unspecified: Secondary | ICD-10-CM | POA: Diagnosis not present

## 2014-05-13 DIAGNOSIS — N189 Chronic kidney disease, unspecified: Secondary | ICD-10-CM | POA: Diagnosis not present

## 2014-05-13 DIAGNOSIS — Z5189 Encounter for other specified aftercare: Secondary | ICD-10-CM | POA: Diagnosis not present

## 2014-05-13 DIAGNOSIS — C9110 Chronic lymphocytic leukemia of B-cell type not having achieved remission: Secondary | ICD-10-CM | POA: Diagnosis not present

## 2014-05-13 DIAGNOSIS — E871 Hypo-osmolality and hyponatremia: Secondary | ICD-10-CM | POA: Diagnosis not present

## 2014-05-13 DIAGNOSIS — Z9981 Dependence on supplemental oxygen: Secondary | ICD-10-CM | POA: Diagnosis not present

## 2014-05-13 DIAGNOSIS — D709 Neutropenia, unspecified: Secondary | ICD-10-CM | POA: Diagnosis not present

## 2014-05-13 DIAGNOSIS — A419 Sepsis, unspecified organism: Secondary | ICD-10-CM | POA: Diagnosis not present

## 2014-05-13 DIAGNOSIS — D702 Other drug-induced agranulocytosis: Secondary | ICD-10-CM | POA: Diagnosis not present

## 2014-05-13 DIAGNOSIS — J189 Pneumonia, unspecified organism: Secondary | ICD-10-CM | POA: Diagnosis not present

## 2014-05-13 DIAGNOSIS — K219 Gastro-esophageal reflux disease without esophagitis: Secondary | ICD-10-CM | POA: Diagnosis not present

## 2014-05-13 DIAGNOSIS — I209 Angina pectoris, unspecified: Secondary | ICD-10-CM | POA: Diagnosis not present

## 2014-05-13 DIAGNOSIS — D649 Anemia, unspecified: Secondary | ICD-10-CM | POA: Diagnosis not present

## 2014-05-13 DIAGNOSIS — I208 Other forms of angina pectoris: Secondary | ICD-10-CM | POA: Diagnosis not present

## 2014-05-13 LAB — CBC WITH DIFFERENTIAL/PLATELET
BANDS NEUTROPHIL: 2 %
EOS PCT: 3 %
HCT: 31.5 % — ABNORMAL LOW (ref 40.0–52.0)
HGB: 10.7 g/dL — ABNORMAL LOW (ref 13.0–18.0)
Lymphocytes: 3 %
MCH: 31.8 pg (ref 26.0–34.0)
MCHC: 34.1 g/dL (ref 32.0–36.0)
MCV: 93 fL (ref 80–100)
Monocytes: 4 %
NRBC/100 WBC: 1 /
PLATELETS: 249 10*3/uL (ref 150–440)
RBC: 3.38 10*6/uL — AB (ref 4.40–5.90)
RDW: 14.7 % — AB (ref 11.5–14.5)
SEGMENTED NEUTROPHILS: 88 %
WBC: 9.5 10*3/uL (ref 3.8–10.6)

## 2014-05-13 LAB — BASIC METABOLIC PANEL
Anion Gap: 11 (ref 7–16)
BUN: 14 mg/dL (ref 7–18)
CO2: 23 mmol/L (ref 21–32)
CREATININE: 1.3 mg/dL (ref 0.60–1.30)
Calcium, Total: 7.1 mg/dL — ABNORMAL LOW (ref 8.5–10.1)
Chloride: 112 mmol/L — ABNORMAL HIGH (ref 98–107)
EGFR (Non-African Amer.): 49 — ABNORMAL LOW
GFR CALC AF AMER: 57 — AB
Glucose: 89 mg/dL (ref 65–99)
OSMOLALITY: 291 (ref 275–301)
POTASSIUM: 3.1 mmol/L — AB (ref 3.5–5.1)
Sodium: 146 mmol/L — ABNORMAL HIGH (ref 136–145)

## 2014-05-14 LAB — CULTURE, BLOOD (SINGLE)

## 2014-05-19 DIAGNOSIS — J189 Pneumonia, unspecified organism: Secondary | ICD-10-CM | POA: Diagnosis not present

## 2014-05-19 DIAGNOSIS — C9110 Chronic lymphocytic leukemia of B-cell type not having achieved remission: Secondary | ICD-10-CM | POA: Diagnosis not present

## 2014-05-19 DIAGNOSIS — N189 Chronic kidney disease, unspecified: Secondary | ICD-10-CM | POA: Diagnosis not present

## 2014-05-19 DIAGNOSIS — I208 Other forms of angina pectoris: Secondary | ICD-10-CM | POA: Diagnosis not present

## 2014-05-31 ENCOUNTER — Ambulatory Visit: Payer: Self-pay | Admitting: Hematology and Oncology

## 2014-06-08 ENCOUNTER — Inpatient Hospital Stay: Payer: Self-pay | Admitting: Internal Medicine

## 2014-06-08 DIAGNOSIS — C9110 Chronic lymphocytic leukemia of B-cell type not having achieved remission: Secondary | ICD-10-CM | POA: Diagnosis not present

## 2014-06-08 DIAGNOSIS — E43 Unspecified severe protein-calorie malnutrition: Secondary | ICD-10-CM | POA: Diagnosis not present

## 2014-06-08 DIAGNOSIS — R5381 Other malaise: Secondary | ICD-10-CM | POA: Diagnosis present

## 2014-06-08 DIAGNOSIS — A0472 Enterocolitis due to Clostridium difficile, not specified as recurrent: Secondary | ICD-10-CM | POA: Diagnosis not present

## 2014-06-08 DIAGNOSIS — Z7982 Long term (current) use of aspirin: Secondary | ICD-10-CM | POA: Diagnosis not present

## 2014-06-08 DIAGNOSIS — I5031 Acute diastolic (congestive) heart failure: Secondary | ICD-10-CM | POA: Diagnosis not present

## 2014-06-08 DIAGNOSIS — D649 Anemia, unspecified: Secondary | ICD-10-CM | POA: Diagnosis not present

## 2014-06-08 DIAGNOSIS — R918 Other nonspecific abnormal finding of lung field: Secondary | ICD-10-CM | POA: Diagnosis not present

## 2014-06-08 DIAGNOSIS — J9819 Other pulmonary collapse: Secondary | ICD-10-CM | POA: Diagnosis present

## 2014-06-08 DIAGNOSIS — E876 Hypokalemia: Secondary | ICD-10-CM | POA: Diagnosis present

## 2014-06-08 DIAGNOSIS — R031 Nonspecific low blood-pressure reading: Secondary | ICD-10-CM | POA: Diagnosis not present

## 2014-06-08 DIAGNOSIS — J9600 Acute respiratory failure, unspecified whether with hypoxia or hypercapnia: Secondary | ICD-10-CM | POA: Diagnosis not present

## 2014-06-08 DIAGNOSIS — R000 Tachycardia, unspecified: Secondary | ICD-10-CM | POA: Diagnosis not present

## 2014-06-08 DIAGNOSIS — J988 Other specified respiratory disorders: Secondary | ICD-10-CM | POA: Diagnosis not present

## 2014-06-08 DIAGNOSIS — K5289 Other specified noninfective gastroenteritis and colitis: Secondary | ICD-10-CM | POA: Diagnosis not present

## 2014-06-08 DIAGNOSIS — J189 Pneumonia, unspecified organism: Secondary | ICD-10-CM | POA: Diagnosis not present

## 2014-06-08 DIAGNOSIS — J398 Other specified diseases of upper respiratory tract: Secondary | ICD-10-CM | POA: Diagnosis not present

## 2014-06-08 DIAGNOSIS — A414 Sepsis due to anaerobes: Secondary | ICD-10-CM | POA: Diagnosis not present

## 2014-06-08 DIAGNOSIS — Z5189 Encounter for other specified aftercare: Secondary | ICD-10-CM | POA: Diagnosis not present

## 2014-06-08 DIAGNOSIS — N200 Calculus of kidney: Secondary | ICD-10-CM | POA: Diagnosis not present

## 2014-06-08 DIAGNOSIS — J8410 Pulmonary fibrosis, unspecified: Secondary | ICD-10-CM | POA: Diagnosis not present

## 2014-06-08 DIAGNOSIS — J438 Other emphysema: Secondary | ICD-10-CM | POA: Diagnosis not present

## 2014-06-08 DIAGNOSIS — R5383 Other fatigue: Secondary | ICD-10-CM | POA: Diagnosis present

## 2014-06-08 DIAGNOSIS — A419 Sepsis, unspecified organism: Secondary | ICD-10-CM | POA: Diagnosis not present

## 2014-06-08 DIAGNOSIS — R6510 Systemic inflammatory response syndrome (SIRS) of non-infectious origin without acute organ dysfunction: Secondary | ICD-10-CM | POA: Diagnosis not present

## 2014-06-08 DIAGNOSIS — Z0389 Encounter for observation for other suspected diseases and conditions ruled out: Secondary | ICD-10-CM | POA: Diagnosis not present

## 2014-06-08 DIAGNOSIS — D72829 Elevated white blood cell count, unspecified: Secondary | ICD-10-CM | POA: Diagnosis not present

## 2014-06-08 DIAGNOSIS — J479 Bronchiectasis, uncomplicated: Secondary | ICD-10-CM | POA: Diagnosis not present

## 2014-06-08 DIAGNOSIS — N189 Chronic kidney disease, unspecified: Secondary | ICD-10-CM | POA: Diagnosis present

## 2014-06-08 DIAGNOSIS — I509 Heart failure, unspecified: Secondary | ICD-10-CM | POA: Diagnosis present

## 2014-06-08 DIAGNOSIS — R0602 Shortness of breath: Secondary | ICD-10-CM | POA: Diagnosis not present

## 2014-06-08 DIAGNOSIS — I959 Hypotension, unspecified: Secondary | ICD-10-CM | POA: Diagnosis not present

## 2014-06-08 DIAGNOSIS — J90 Pleural effusion, not elsewhere classified: Secondary | ICD-10-CM | POA: Diagnosis not present

## 2014-06-08 DIAGNOSIS — I10 Essential (primary) hypertension: Secondary | ICD-10-CM | POA: Diagnosis not present

## 2014-06-08 DIAGNOSIS — E870 Hyperosmolality and hypernatremia: Secondary | ICD-10-CM | POA: Diagnosis not present

## 2014-06-08 DIAGNOSIS — D638 Anemia in other chronic diseases classified elsewhere: Secondary | ICD-10-CM | POA: Diagnosis present

## 2014-06-08 DIAGNOSIS — R197 Diarrhea, unspecified: Secondary | ICD-10-CM | POA: Diagnosis not present

## 2014-06-08 LAB — COMPREHENSIVE METABOLIC PANEL
ANION GAP: 9 (ref 7–16)
Albumin: 2.3 g/dL — ABNORMAL LOW (ref 3.4–5.0)
Alkaline Phosphatase: 100 U/L
BUN: 22 mg/dL — AB (ref 7–18)
Bilirubin,Total: 0.5 mg/dL (ref 0.2–1.0)
CALCIUM: 7.1 mg/dL — AB (ref 8.5–10.1)
CO2: 21 mmol/L (ref 21–32)
CREATININE: 1.3 mg/dL (ref 0.60–1.30)
Chloride: 108 mmol/L — ABNORMAL HIGH (ref 98–107)
EGFR (Non-African Amer.): 49 — ABNORMAL LOW
GFR CALC AF AMER: 56 — AB
Glucose: 89 mg/dL (ref 65–99)
OSMOLALITY: 278 (ref 275–301)
Potassium: 3.7 mmol/L (ref 3.5–5.1)
SGOT(AST): 19 U/L (ref 15–37)
SGPT (ALT): 8 U/L — ABNORMAL LOW
Sodium: 138 mmol/L (ref 136–145)
Total Protein: 5.6 g/dL — ABNORMAL LOW (ref 6.4–8.2)

## 2014-06-08 LAB — CBC WITH DIFFERENTIAL/PLATELET
BASOS PCT: 0.1 %
Basophil #: 0.1 10*3/uL (ref 0.0–0.1)
EOS ABS: 0 10*3/uL (ref 0.0–0.7)
Eosinophil %: 0.1 %
HCT: 36.7 % — ABNORMAL LOW (ref 40.0–52.0)
HGB: 12 g/dL — AB (ref 13.0–18.0)
Lymphocyte #: 1.2 10*3/uL (ref 1.0–3.6)
Lymphocyte %: 2.6 %
MCH: 30.4 pg (ref 26.0–34.0)
MCHC: 32.7 g/dL (ref 32.0–36.0)
MCV: 93 fL (ref 80–100)
MONO ABS: 2.3 x10 3/mm — AB (ref 0.2–1.0)
MONOS PCT: 4.7 %
NEUTROS ABS: 44.7 10*3/uL — AB (ref 1.4–6.5)
NEUTROS PCT: 92.5 %
PLATELETS: 376 10*3/uL (ref 150–440)
RBC: 3.94 10*6/uL — AB (ref 4.40–5.90)
RDW: 16.1 % — ABNORMAL HIGH (ref 11.5–14.5)
WBC: 48.3 10*3/uL — ABNORMAL HIGH (ref 3.8–10.6)

## 2014-06-08 LAB — CK TOTAL AND CKMB (NOT AT ARMC)
CK, Total: 32 U/L — ABNORMAL LOW
CK-MB: <0.5 ng/mL — ABNORMAL LOW (ref 0.5–3.6)

## 2014-06-08 LAB — TROPONIN I: Troponin-I: <0.02 ng/mL

## 2014-06-09 LAB — CBC WITH DIFFERENTIAL/PLATELET
BASOS ABS: 0.1 10*3/uL (ref 0.0–0.1)
Basophil %: 0.1 %
Eosinophil #: 0 10*3/uL (ref 0.0–0.7)
Eosinophil %: 0 %
HCT: 36.6 % — ABNORMAL LOW (ref 40.0–52.0)
HGB: 11.8 g/dL — AB (ref 13.0–18.0)
LYMPHS ABS: 1.6 10*3/uL (ref 1.0–3.6)
LYMPHS PCT: 3.7 %
MCH: 30.6 pg (ref 26.0–34.0)
MCHC: 32.3 g/dL (ref 32.0–36.0)
MCV: 95 fL (ref 80–100)
Monocyte #: 1.8 x10 3/mm — ABNORMAL HIGH (ref 0.2–1.0)
Monocyte %: 4 %
Neutrophil #: 41 10*3/uL — ABNORMAL HIGH (ref 1.4–6.5)
Neutrophil %: 92.2 %
Platelet: 284 10*3/uL (ref 150–440)
RBC: 3.86 10*6/uL — ABNORMAL LOW (ref 4.40–5.90)
RDW: 16.1 % — AB (ref 11.5–14.5)
WBC: 44.5 10*3/uL — AB (ref 3.8–10.6)

## 2014-06-09 LAB — BASIC METABOLIC PANEL
ANION GAP: 13 (ref 7–16)
BUN: 23 mg/dL — AB (ref 7–18)
CALCIUM: 7.9 mg/dL — AB (ref 8.5–10.1)
Chloride: 104 mmol/L (ref 98–107)
Co2: 19 mmol/L — ABNORMAL LOW (ref 21–32)
Creatinine: 1.39 mg/dL — ABNORMAL HIGH (ref 0.60–1.30)
EGFR (African American): 52 — ABNORMAL LOW
EGFR (Non-African Amer.): 45 — ABNORMAL LOW
Glucose: 68 mg/dL (ref 65–99)
OSMOLALITY: 274 (ref 275–301)
POTASSIUM: 4.3 mmol/L (ref 3.5–5.1)
SODIUM: 136 mmol/L (ref 136–145)

## 2014-06-09 LAB — URINALYSIS, COMPLETE
Bilirubin,UR: NEGATIVE
GLUCOSE, UR: NEGATIVE mg/dL (ref 0–75)
KETONE: NEGATIVE
Nitrite: NEGATIVE
Ph: 5 (ref 4.5–8.0)
RBC,UR: 13 /HPF (ref 0–5)
Specific Gravity: 1.018 (ref 1.003–1.030)
Squamous Epithelial: <1
WBC UR: 67 /HPF (ref 0–5)

## 2014-06-09 LAB — CLOSTRIDIUM DIFFICILE(ARMC)

## 2014-06-09 LAB — WBCS, STOOL

## 2014-06-09 LAB — MAGNESIUM: MAGNESIUM: 1.8 mg/dL

## 2014-06-10 LAB — URINE CULTURE

## 2014-06-10 LAB — PLATELET COUNT: PLATELETS: 350 10*3/uL (ref 150–440)

## 2014-06-11 LAB — BASIC METABOLIC PANEL
Anion Gap: 10 (ref 7–16)
BUN: 25 mg/dL — ABNORMAL HIGH (ref 7–18)
Calcium, Total: 7.6 mg/dL — ABNORMAL LOW (ref 8.5–10.1)
Chloride: 111 mmol/L — ABNORMAL HIGH (ref 98–107)
Co2: 17 mmol/L — ABNORMAL LOW (ref 21–32)
Creatinine: 1.31 mg/dL — ABNORMAL HIGH (ref 0.60–1.30)
EGFR (Non-African Amer.): 48 — ABNORMAL LOW
GFR CALC AF AMER: 56 — AB
Glucose: 92 mg/dL (ref 65–99)
Osmolality: 280 (ref 275–301)
POTASSIUM: 3.8 mmol/L (ref 3.5–5.1)
SODIUM: 138 mmol/L (ref 136–145)

## 2014-06-11 LAB — WBC: WBC: 59 10*3/uL — AB (ref 3.8–10.6)

## 2014-06-12 LAB — BASIC METABOLIC PANEL
Anion Gap: 7 (ref 7–16)
BUN: 22 mg/dL — AB (ref 7–18)
CALCIUM: 7.3 mg/dL — AB (ref 8.5–10.1)
CHLORIDE: 112 mmol/L — AB (ref 98–107)
Co2: 20 mmol/L — ABNORMAL LOW (ref 21–32)
Creatinine: 1.26 mg/dL (ref 0.60–1.30)
GFR CALC AF AMER: 59 — AB
GFR CALC NON AF AMER: 51 — AB
GLUCOSE: 103 mg/dL — AB (ref 65–99)
Osmolality: 281 (ref 275–301)
Potassium: 3.7 mmol/L (ref 3.5–5.1)
Sodium: 139 mmol/L (ref 136–145)

## 2014-06-12 LAB — CBC WITH DIFFERENTIAL/PLATELET
BASOS PCT: 0.3 %
Basophil #: 0.1 10*3/uL (ref 0.0–0.1)
Eosinophil #: 0.1 10*3/uL (ref 0.0–0.7)
Eosinophil %: 0.3 %
HCT: 34.2 % — AB (ref 40.0–52.0)
HGB: 11 g/dL — AB (ref 13.0–18.0)
LYMPHS PCT: 2.2 %
Lymphocyte #: 1 10*3/uL (ref 1.0–3.6)
Lymphocytes: 1 %
MCH: 29.8 pg (ref 26.0–34.0)
MCHC: 32.1 g/dL (ref 32.0–36.0)
MCV: 93 fL (ref 80–100)
MONOS PCT: 2.7 %
Monocyte #: 1.2 x10 3/mm — ABNORMAL HIGH (ref 0.2–1.0)
Monocytes: 2 %
Neutrophil #: 42.6 10*3/uL — ABNORMAL HIGH (ref 1.4–6.5)
Neutrophil %: 94.5 %
PLATELETS: 382 10*3/uL (ref 150–440)
RBC: 3.68 10*6/uL — ABNORMAL LOW (ref 4.40–5.90)
RDW: 16.3 % — ABNORMAL HIGH (ref 11.5–14.5)
Segmented Neutrophils: 97 %
WBC: 45.1 10*3/uL — ABNORMAL HIGH (ref 3.8–10.6)

## 2014-06-13 LAB — BASIC METABOLIC PANEL
ANION GAP: 9 (ref 7–16)
BUN: 19 mg/dL — ABNORMAL HIGH (ref 7–18)
CALCIUM: 7.4 mg/dL — AB (ref 8.5–10.1)
CO2: 20 mmol/L — AB (ref 21–32)
Chloride: 114 mmol/L — ABNORMAL HIGH (ref 98–107)
Creatinine: 1.13 mg/dL (ref 0.60–1.30)
GFR CALC NON AF AMER: 58 — AB
GLUCOSE: 103 mg/dL — AB (ref 65–99)
OSMOLALITY: 287 (ref 275–301)
Potassium: 3.8 mmol/L (ref 3.5–5.1)
Sodium: 143 mmol/L (ref 136–145)

## 2014-06-13 LAB — WBC: WBC: 31.7 10*3/uL — ABNORMAL HIGH (ref 3.8–10.6)

## 2014-06-13 LAB — STOOL CULTURE

## 2014-06-13 LAB — CULTURE, BLOOD (SINGLE)

## 2014-06-14 LAB — CBC WITH DIFFERENTIAL/PLATELET
Basophil #: 0 10*3/uL (ref 0.0–0.1)
Basophil %: 0.1 %
EOS ABS: 0.3 10*3/uL (ref 0.0–0.7)
Eosinophil %: 1.2 %
HCT: 39.4 % — AB (ref 40.0–52.0)
HGB: 12.5 g/dL — AB (ref 13.0–18.0)
LYMPHS ABS: 1.1 10*3/uL (ref 1.0–3.6)
LYMPHS PCT: 5.1 %
MCH: 30 pg (ref 26.0–34.0)
MCHC: 31.9 g/dL — AB (ref 32.0–36.0)
MCV: 94 fL (ref 80–100)
MONOS PCT: 3.8 %
Monocyte #: 0.8 x10 3/mm (ref 0.2–1.0)
Neutrophil #: 19.8 10*3/uL — ABNORMAL HIGH (ref 1.4–6.5)
Neutrophil %: 89.8 %
Platelet: 385 10*3/uL (ref 150–440)
RBC: 4.18 10*6/uL — ABNORMAL LOW (ref 4.40–5.90)
RDW: 16.7 % — ABNORMAL HIGH (ref 11.5–14.5)
WBC: 22 10*3/uL — AB (ref 3.8–10.6)

## 2014-06-14 LAB — PHOSPHORUS: PHOSPHORUS: 1.6 mg/dL — AB (ref 2.5–4.9)

## 2014-06-14 LAB — BASIC METABOLIC PANEL
ANION GAP: 9 (ref 7–16)
BUN: 14 mg/dL (ref 7–18)
CREATININE: 1.1 mg/dL (ref 0.60–1.30)
Calcium, Total: 7.1 mg/dL — ABNORMAL LOW (ref 8.5–10.1)
Chloride: 116 mmol/L — ABNORMAL HIGH (ref 98–107)
Co2: 19 mmol/L — ABNORMAL LOW (ref 21–32)
EGFR (African American): >60
EGFR (Non-African Amer.): 60 — ABNORMAL LOW
GLUCOSE: 115 mg/dL — AB (ref 65–99)
Osmolality: 288 (ref 275–301)
Potassium: 3.3 mmol/L — ABNORMAL LOW (ref 3.5–5.1)
Sodium: 144 mmol/L (ref 136–145)

## 2014-06-14 LAB — MAGNESIUM: Magnesium: 1.8 mg/dL

## 2014-06-14 LAB — CLOSTRIDIUM DIFFICILE(ARMC)

## 2014-06-15 ENCOUNTER — Ambulatory Visit: Payer: Medicare Other | Admitting: Family Medicine

## 2014-06-15 LAB — BASIC METABOLIC PANEL
ANION GAP: 6 — AB (ref 7–16)
BUN: 11 mg/dL (ref 7–18)
CO2: 21 mmol/L (ref 21–32)
Calcium, Total: 7.1 mg/dL — ABNORMAL LOW (ref 8.5–10.1)
Chloride: 116 mmol/L — ABNORMAL HIGH (ref 98–107)
Creatinine: 1.03 mg/dL (ref 0.60–1.30)
EGFR (African American): >60
EGFR (Non-African Amer.): >60
Glucose: 112 mg/dL — ABNORMAL HIGH (ref 65–99)
OSMOLALITY: 285 (ref 275–301)
POTASSIUM: 3.4 mmol/L — AB (ref 3.5–5.1)
Sodium: 143 mmol/L (ref 136–145)

## 2014-06-15 LAB — CBC WITH DIFFERENTIAL/PLATELET
Basophil #: 0.1 10*3/uL (ref 0.0–0.1)
Basophil %: 0.3 %
Eosinophil #: 0.3 10*3/uL (ref 0.0–0.7)
Eosinophil %: 1.3 %
HCT: 36.9 % — AB (ref 40.0–52.0)
HGB: 12.4 g/dL — ABNORMAL LOW (ref 13.0–18.0)
Lymphocyte #: 1 10*3/uL (ref 1.0–3.6)
Lymphocyte %: 5.1 %
MCH: 30.9 pg (ref 26.0–34.0)
MCHC: 33.5 g/dL (ref 32.0–36.0)
MCV: 92 fL (ref 80–100)
MONO ABS: 0.8 x10 3/mm (ref 0.2–1.0)
MONOS PCT: 4.1 %
Neutrophil #: 18 10*3/uL — ABNORMAL HIGH (ref 1.4–6.5)
Neutrophil %: 89.2 %
PLATELETS: 396 10*3/uL (ref 150–440)
RBC: 4 10*6/uL — ABNORMAL LOW (ref 4.40–5.90)
RDW: 16.5 % — ABNORMAL HIGH (ref 11.5–14.5)
WBC: 20.2 10*3/uL — AB (ref 3.8–10.6)

## 2014-06-15 LAB — WBCS, STOOL

## 2014-06-15 LAB — MAGNESIUM: MAGNESIUM: 1.7 mg/dL — AB

## 2014-06-16 LAB — WBC: WBC: 19.5 10*3/uL — AB (ref 3.8–10.6)

## 2014-06-17 LAB — BASIC METABOLIC PANEL
Anion Gap: 9 (ref 7–16)
BUN: 10 mg/dL (ref 7–18)
CALCIUM: 7.8 mg/dL — AB (ref 8.5–10.1)
CREATININE: 1.1 mg/dL (ref 0.60–1.30)
Chloride: 118 mmol/L — ABNORMAL HIGH (ref 98–107)
Co2: 20 mmol/L — ABNORMAL LOW (ref 21–32)
EGFR (Non-African Amer.): 60 — ABNORMAL LOW
Glucose: 110 mg/dL — ABNORMAL HIGH (ref 65–99)
OSMOLALITY: 292 (ref 275–301)
Potassium: 3.8 mmol/L (ref 3.5–5.1)
Sodium: 147 mmol/L — ABNORMAL HIGH (ref 136–145)

## 2014-06-17 LAB — CBC WITH DIFFERENTIAL/PLATELET
Basophil #: 0.1 10*3/uL (ref 0.0–0.1)
Basophil %: 0.4 %
EOS ABS: 0.2 10*3/uL (ref 0.0–0.7)
EOS PCT: 1 %
HCT: 37.9 % — AB (ref 40.0–52.0)
HGB: 12.5 g/dL — ABNORMAL LOW (ref 13.0–18.0)
LYMPHS ABS: 1.6 10*3/uL (ref 1.0–3.6)
Lymphocyte %: 7 %
MCH: 30.5 pg (ref 26.0–34.0)
MCHC: 32.9 g/dL (ref 32.0–36.0)
MCV: 93 fL (ref 80–100)
MONO ABS: 0.9 x10 3/mm (ref 0.2–1.0)
Monocyte %: 3.8 %
NEUTROS ABS: 19.5 10*3/uL — AB (ref 1.4–6.5)
Neutrophil %: 87.8 %
PLATELETS: 449 10*3/uL — AB (ref 150–440)
RBC: 4.08 10*6/uL — ABNORMAL LOW (ref 4.40–5.90)
RDW: 18 % — AB (ref 11.5–14.5)
WBC: 22.2 10*3/uL — ABNORMAL HIGH (ref 3.8–10.6)

## 2014-06-17 LAB — STOOL CULTURE

## 2014-06-18 LAB — CBC WITH DIFFERENTIAL/PLATELET
Basophil #: 0 10*3/uL (ref 0.0–0.1)
Basophil %: 0.2 %
Eosinophil #: 0.2 10*3/uL (ref 0.0–0.7)
Eosinophil %: 1.4 %
HCT: 36.5 % — ABNORMAL LOW (ref 40.0–52.0)
HGB: 12 g/dL — ABNORMAL LOW (ref 13.0–18.0)
Lymphocyte #: 1.1 10*3/uL (ref 1.0–3.6)
Lymphocyte %: 8.3 %
MCH: 30.6 pg (ref 26.0–34.0)
MCHC: 32.8 g/dL (ref 32.0–36.0)
MCV: 93 fL (ref 80–100)
MONOS PCT: 4 %
Monocyte #: 0.5 x10 3/mm (ref 0.2–1.0)
Neutrophil #: 11.6 10*3/uL — ABNORMAL HIGH (ref 1.4–6.5)
Neutrophil %: 86.1 %
PLATELETS: 406 10*3/uL (ref 150–440)
RBC: 3.91 10*6/uL — ABNORMAL LOW (ref 4.40–5.90)
RDW: 17.8 % — AB (ref 11.5–14.5)
WBC: 13.5 10*3/uL — AB (ref 3.8–10.6)

## 2014-06-18 LAB — BASIC METABOLIC PANEL
ANION GAP: 5 — AB (ref 7–16)
BUN: 8 mg/dL (ref 7–18)
CALCIUM: 7.6 mg/dL — AB (ref 8.5–10.1)
CHLORIDE: 115 mmol/L — AB (ref 98–107)
CREATININE: 1.24 mg/dL (ref 0.60–1.30)
Co2: 23 mmol/L (ref 21–32)
GFR CALC AF AMER: 60 — AB
GFR CALC NON AF AMER: 52 — AB
GLUCOSE: 112 mg/dL — AB (ref 65–99)
Osmolality: 284 (ref 275–301)
POTASSIUM: 3.6 mmol/L (ref 3.5–5.1)
Sodium: 143 mmol/L (ref 136–145)

## 2014-06-20 LAB — CBC WITH DIFFERENTIAL/PLATELET
Basophil #: 0.1 10*3/uL (ref 0.0–0.1)
Basophil %: 0.4 %
EOS ABS: 0.2 10*3/uL (ref 0.0–0.7)
Eosinophil %: 1.2 %
HCT: 36.4 % — ABNORMAL LOW (ref 40.0–52.0)
HGB: 11.6 g/dL — ABNORMAL LOW (ref 13.0–18.0)
Lymphocyte #: 1.7 10*3/uL (ref 1.0–3.6)
Lymphocyte %: 12 %
MCH: 29.4 pg (ref 26.0–34.0)
MCHC: 31.8 g/dL — ABNORMAL LOW (ref 32.0–36.0)
MCV: 92 fL (ref 80–100)
Monocyte #: 0.6 x10 3/mm (ref 0.2–1.0)
Monocyte %: 3.9 %
Neutrophil #: 11.8 10*3/uL — ABNORMAL HIGH (ref 1.4–6.5)
Neutrophil %: 82.5 %
Platelet: 467 10*3/uL — ABNORMAL HIGH (ref 150–440)
RBC: 3.94 10*6/uL — AB (ref 4.40–5.90)
RDW: 17.7 % — ABNORMAL HIGH (ref 11.5–14.5)
WBC: 14.2 10*3/uL — ABNORMAL HIGH (ref 3.8–10.6)

## 2014-06-21 DIAGNOSIS — N189 Chronic kidney disease, unspecified: Secondary | ICD-10-CM | POA: Diagnosis not present

## 2014-06-21 DIAGNOSIS — A0472 Enterocolitis due to Clostridium difficile, not specified as recurrent: Secondary | ICD-10-CM | POA: Diagnosis not present

## 2014-06-21 DIAGNOSIS — R000 Tachycardia, unspecified: Secondary | ICD-10-CM | POA: Diagnosis not present

## 2014-06-21 DIAGNOSIS — Z5189 Encounter for other specified aftercare: Secondary | ICD-10-CM | POA: Diagnosis not present

## 2014-06-21 DIAGNOSIS — I10 Essential (primary) hypertension: Secondary | ICD-10-CM | POA: Diagnosis not present

## 2014-06-21 DIAGNOSIS — M6281 Muscle weakness (generalized): Secondary | ICD-10-CM | POA: Diagnosis not present

## 2014-06-21 DIAGNOSIS — R262 Difficulty in walking, not elsewhere classified: Secondary | ICD-10-CM | POA: Diagnosis not present

## 2014-06-21 DIAGNOSIS — A047 Enterocolitis due to Clostridium difficile: Secondary | ICD-10-CM | POA: Diagnosis not present

## 2014-06-21 DIAGNOSIS — J189 Pneumonia, unspecified organism: Secondary | ICD-10-CM | POA: Diagnosis not present

## 2014-06-21 DIAGNOSIS — J9600 Acute respiratory failure, unspecified whether with hypoxia or hypercapnia: Secondary | ICD-10-CM | POA: Diagnosis not present

## 2014-06-21 DIAGNOSIS — A419 Sepsis, unspecified organism: Secondary | ICD-10-CM | POA: Diagnosis not present

## 2014-06-21 DIAGNOSIS — D649 Anemia, unspecified: Secondary | ICD-10-CM | POA: Diagnosis not present

## 2014-06-21 DIAGNOSIS — Z0389 Encounter for observation for other suspected diseases and conditions ruled out: Secondary | ICD-10-CM | POA: Diagnosis not present

## 2014-06-21 DIAGNOSIS — C9110 Chronic lymphocytic leukemia of B-cell type not having achieved remission: Secondary | ICD-10-CM | POA: Diagnosis not present

## 2014-06-21 DIAGNOSIS — I5020 Unspecified systolic (congestive) heart failure: Secondary | ICD-10-CM | POA: Diagnosis not present

## 2014-06-21 DIAGNOSIS — I509 Heart failure, unspecified: Secondary | ICD-10-CM | POA: Diagnosis not present

## 2014-06-21 DIAGNOSIS — I959 Hypotension, unspecified: Secondary | ICD-10-CM | POA: Diagnosis not present

## 2014-06-24 DIAGNOSIS — J9600 Acute respiratory failure, unspecified whether with hypoxia or hypercapnia: Secondary | ICD-10-CM | POA: Diagnosis not present

## 2014-06-24 DIAGNOSIS — C9110 Chronic lymphocytic leukemia of B-cell type not having achieved remission: Secondary | ICD-10-CM | POA: Diagnosis not present

## 2014-06-24 DIAGNOSIS — I10 Essential (primary) hypertension: Secondary | ICD-10-CM | POA: Diagnosis not present

## 2014-06-24 DIAGNOSIS — R000 Tachycardia, unspecified: Secondary | ICD-10-CM | POA: Diagnosis not present

## 2014-06-24 DIAGNOSIS — A0472 Enterocolitis due to Clostridium difficile, not specified as recurrent: Secondary | ICD-10-CM | POA: Diagnosis not present

## 2014-06-24 DIAGNOSIS — I509 Heart failure, unspecified: Secondary | ICD-10-CM | POA: Diagnosis not present

## 2014-06-28 DIAGNOSIS — I509 Heart failure, unspecified: Secondary | ICD-10-CM | POA: Diagnosis not present

## 2014-06-28 DIAGNOSIS — I10 Essential (primary) hypertension: Secondary | ICD-10-CM | POA: Diagnosis not present

## 2014-06-28 DIAGNOSIS — J189 Pneumonia, unspecified organism: Secondary | ICD-10-CM | POA: Diagnosis not present

## 2014-06-28 DIAGNOSIS — D649 Anemia, unspecified: Secondary | ICD-10-CM | POA: Diagnosis not present

## 2014-06-28 DIAGNOSIS — N189 Chronic kidney disease, unspecified: Secondary | ICD-10-CM | POA: Diagnosis not present

## 2014-06-28 DIAGNOSIS — A0472 Enterocolitis due to Clostridium difficile, not specified as recurrent: Secondary | ICD-10-CM | POA: Diagnosis not present

## 2014-06-30 ENCOUNTER — Ambulatory Visit: Payer: Self-pay | Admitting: Hematology and Oncology

## 2014-07-11 ENCOUNTER — Telehealth: Payer: Self-pay

## 2014-07-11 DIAGNOSIS — I482 Chronic atrial fibrillation: Secondary | ICD-10-CM | POA: Diagnosis not present

## 2014-07-11 DIAGNOSIS — J189 Pneumonia, unspecified organism: Secondary | ICD-10-CM | POA: Diagnosis not present

## 2014-07-11 DIAGNOSIS — N189 Chronic kidney disease, unspecified: Secondary | ICD-10-CM | POA: Diagnosis not present

## 2014-07-11 DIAGNOSIS — R0603 Acute respiratory distress: Secondary | ICD-10-CM

## 2014-07-11 DIAGNOSIS — J918 Pleural effusion in other conditions classified elsewhere: Secondary | ICD-10-CM | POA: Diagnosis not present

## 2014-07-11 DIAGNOSIS — D869 Sarcoidosis, unspecified: Secondary | ICD-10-CM

## 2014-07-11 DIAGNOSIS — D649 Anemia, unspecified: Secondary | ICD-10-CM | POA: Diagnosis not present

## 2014-07-11 DIAGNOSIS — I129 Hypertensive chronic kidney disease with stage 1 through stage 4 chronic kidney disease, or unspecified chronic kidney disease: Secondary | ICD-10-CM | POA: Diagnosis not present

## 2014-07-11 NOTE — Telephone Encounter (Signed)
Santiago Glad nurse with [facility] left v/m; pt was admitted to [facility]. Pt was sent home to have nebulizer treatments. Pt does not have a nebulizer and request order for nebulizer faxed to [facility].Please advise.

## 2014-07-11 NOTE — Telephone Encounter (Signed)
Plz clarify they only need neb machine order. Order written and placed in Kim's box.

## 2014-07-12 DIAGNOSIS — I482 Chronic atrial fibrillation: Secondary | ICD-10-CM | POA: Diagnosis not present

## 2014-07-12 DIAGNOSIS — N189 Chronic kidney disease, unspecified: Secondary | ICD-10-CM | POA: Diagnosis not present

## 2014-07-12 DIAGNOSIS — I129 Hypertensive chronic kidney disease with stage 1 through stage 4 chronic kidney disease, or unspecified chronic kidney disease: Secondary | ICD-10-CM | POA: Diagnosis not present

## 2014-07-12 DIAGNOSIS — J189 Pneumonia, unspecified organism: Secondary | ICD-10-CM | POA: Diagnosis not present

## 2014-07-12 DIAGNOSIS — D649 Anemia, unspecified: Secondary | ICD-10-CM | POA: Diagnosis not present

## 2014-07-12 DIAGNOSIS — J918 Pleural effusion in other conditions classified elsewhere: Secondary | ICD-10-CM | POA: Diagnosis not present

## 2014-07-12 NOTE — Telephone Encounter (Addendum)
Order placed through Epic and received by [facility].

## 2014-07-12 NOTE — Telephone Encounter (Signed)
Karen left v/m; unable to understand v/m due to static and clicking. Santiago Glad can be reached at [phone].

## 2014-07-12 NOTE — Telephone Encounter (Signed)
Message left for Harold Chavez to return my call.  

## 2014-07-14 ENCOUNTER — Ambulatory Visit (INDEPENDENT_AMBULATORY_CARE_PROVIDER_SITE_OTHER): Payer: Medicare Other | Admitting: Family Medicine

## 2014-07-14 ENCOUNTER — Encounter: Payer: Self-pay | Admitting: Family Medicine

## 2014-07-14 ENCOUNTER — Telehealth: Payer: Self-pay | Admitting: *Deleted

## 2014-07-14 VITALS — BP 124/78 | HR 72 | Temp 97.6°F | Wt 135.2 lb

## 2014-07-14 DIAGNOSIS — A0472 Enterocolitis due to Clostridium difficile, not specified as recurrent: Secondary | ICD-10-CM

## 2014-07-14 DIAGNOSIS — E441 Mild protein-calorie malnutrition: Secondary | ICD-10-CM

## 2014-07-14 DIAGNOSIS — D0339 Melanoma in situ of other parts of face: Secondary | ICD-10-CM

## 2014-07-14 DIAGNOSIS — R591 Generalized enlarged lymph nodes: Secondary | ICD-10-CM

## 2014-07-14 DIAGNOSIS — D531 Other megaloblastic anemias, not elsewhere classified: Secondary | ICD-10-CM | POA: Diagnosis not present

## 2014-07-14 DIAGNOSIS — N183 Chronic kidney disease, stage 3 unspecified: Secondary | ICD-10-CM

## 2014-07-14 DIAGNOSIS — A047 Enterocolitis due to Clostridium difficile: Secondary | ICD-10-CM | POA: Diagnosis not present

## 2014-07-14 DIAGNOSIS — D518 Other vitamin B12 deficiency anemias: Secondary | ICD-10-CM

## 2014-07-14 DIAGNOSIS — R3915 Urgency of urination: Secondary | ICD-10-CM

## 2014-07-14 DIAGNOSIS — D0430 Carcinoma in situ of skin of unspecified part of face: Secondary | ICD-10-CM | POA: Diagnosis not present

## 2014-07-14 DIAGNOSIS — Z8701 Personal history of pneumonia (recurrent): Secondary | ICD-10-CM

## 2014-07-14 DIAGNOSIS — C8300 Small cell B-cell lymphoma, unspecified site: Secondary | ICD-10-CM

## 2014-07-14 HISTORY — DX: Enterocolitis due to Clostridium difficile, not specified as recurrent: A04.72

## 2014-07-14 HISTORY — DX: Personal history of pneumonia (recurrent): Z87.01

## 2014-07-14 LAB — POCT URINALYSIS DIPSTICK
Bilirubin, UA: NEGATIVE
Glucose, UA: NEGATIVE
Ketones, UA: NEGATIVE
Leukocytes, UA: NEGATIVE
NITRITE UA: NEGATIVE
SPEC GRAV UA: 1.02
UROBILINOGEN UA: 0.2
pH, UA: 6

## 2014-07-14 LAB — CBC WITH DIFFERENTIAL/PLATELET
BASOS ABS: 0 10*3/uL (ref 0.0–0.1)
Basophils Relative: 0.2 % (ref 0.0–3.0)
Eosinophils Absolute: 0 10*3/uL (ref 0.0–0.7)
Eosinophils Relative: 0.2 % (ref 0.0–5.0)
HEMATOCRIT: 36.3 % — AB (ref 39.0–52.0)
Hemoglobin: 12.1 g/dL — ABNORMAL LOW (ref 13.0–17.0)
LYMPHS ABS: 2.3 10*3/uL (ref 0.7–4.0)
Lymphocytes Relative: 8.8 % — ABNORMAL LOW (ref 12.0–46.0)
MCHC: 33.3 g/dL (ref 30.0–36.0)
MCV: 93.7 fl (ref 78.0–100.0)
Monocytes Absolute: 1.6 10*3/uL — ABNORMAL HIGH (ref 0.1–1.0)
Monocytes Relative: 6.1 % (ref 3.0–12.0)
Neutro Abs: 22.5 10*3/uL — ABNORMAL HIGH (ref 1.4–7.7)
Neutrophils Relative %: 84.7 % — ABNORMAL HIGH (ref 43.0–77.0)
Platelets: 391 10*3/uL (ref 150.0–400.0)
RBC: 3.88 Mil/uL — ABNORMAL LOW (ref 4.22–5.81)
RDW: 18.7 % — ABNORMAL HIGH (ref 11.5–15.5)
WBC: 26.4 10*3/uL (ref 4.0–10.5)

## 2014-07-14 LAB — RENAL FUNCTION PANEL
Albumin: 3.1 g/dL — ABNORMAL LOW (ref 3.5–5.2)
BUN: 17 mg/dL (ref 6–23)
CO2: 29 mEq/L (ref 19–32)
Calcium: 8.7 mg/dL (ref 8.4–10.5)
Chloride: 102 mEq/L (ref 96–112)
Creatinine, Ser: 1.1 mg/dL (ref 0.4–1.5)
GFR: 69.31 mL/min (ref >60.00)
GLUCOSE: 89 mg/dL (ref 70–99)
Phosphorus: 2.7 mg/dL (ref 2.3–4.6)
Potassium: 4 mEq/L (ref 3.5–5.1)
SODIUM: 137 meq/L (ref 135–145)

## 2014-07-14 LAB — VITAMIN B12: Vitamin B-12: 276 pg/mL (ref 211–911)

## 2014-07-14 MED ORDER — CYANOCOBALAMIN 1000 MCG/ML IJ SOLN
1000.0000 ug | Freq: Once | INTRAMUSCULAR | Status: AC
  Administered 2014-07-14: 1000 ug via INTRAMUSCULAR

## 2014-07-14 NOTE — Assessment & Plan Note (Signed)
With some residual diarrhea but overall improving every day. Discussed ok to use immodium sparingly, if persistent diarrhea to update me for further evaluation.

## 2014-07-14 NOTE — Assessment & Plan Note (Signed)
S/p rehab stay. Seems to be doing well today. Lungs clear.

## 2014-07-14 NOTE — Assessment & Plan Note (Signed)
To establish with new onc at [facility]. Appreciate onc care of patient. Seems to have responded well to chemotherapy.

## 2014-07-14 NOTE — Addendum Note (Signed)
Addended by: Royann Shivers A on: 07/14/2014 12:47 PM   Modules accepted: Orders

## 2014-07-14 NOTE — Assessment & Plan Note (Signed)
Recheck renal panel today. Check alb to eval for malnutrition Lab Results  Component Value Date   CREATININE 1.7* 11/10/2013

## 2014-07-14 NOTE — Assessment & Plan Note (Signed)
This has largely resolved after chemotherapy.

## 2014-07-14 NOTE — Telephone Encounter (Signed)
Noted. Known SLL. See results note.

## 2014-07-14 NOTE — Assessment & Plan Note (Signed)
S/p removal by derm (mohs), faint scar present.

## 2014-07-14 NOTE — Patient Instructions (Addendum)
Urinalysis today. Blood work today then B12 shot Continue current medicines.

## 2014-07-14 NOTE — Progress Notes (Signed)
Pre visit review using our clinic review tool, if applicable. No additional management support is needed unless otherwise documented below in the visit note. 

## 2014-07-14 NOTE — Progress Notes (Addendum)
BP 124/78  Pulse 72  Temp(Src) 97.6 F (36.4 C) (Oral)  Wt 135 lb 4 oz (61.349 kg)   CC: SNF f/u  Subjective:    Patient ID: Harold Chavez, male    DOB: Dec 15, 1923, 80 y.o.   MRN: [id number]  HPI: Harold Chavez is a 80 y.o. male presenting on 07/14/2014 for Follow-up   Not seen here since 11/2013. Recent hospitalization 05/2014 with PNA and dehydration then admitted to rehab [facility] for 10 days. Readmitted with fever/diarrhea dx with Cdiff infection, 2 wk hospitalization, then rehab stint at [facility] for 2 weeks. Discharged back home on Sunday Oct 11th. No records available - we have requested. He was started on diltiazem 120mg  daily unclear reason - son states for rapid heart rate. ?afib. Will await records and then decide on need to conitnue med.  Recent dx small lymphocytic lymphoma followed by [facility] onc. Completed chemotherapy treatments and did very well. Dr. Kallie Edward left, in process of establishing with new onc. No f/u appt set up yet.  Since he's been home, still with loose bowels about 2-3 times daily. Much improved. No fevers, never with abd pain.   Increased urination noted recently with urgency over last month. No dysuria, fevers, nausea, back pain.  Increased cough noted recently. Clear sputum. Denies fevers, dyspnea or wheezing. Never smoker.  Appetite picking up.  Wt Readings from Last 3 Encounters:  07/14/14 135 lb 4 oz (61.349 kg)  12/06/13 143 lb (64.864 kg)  12/01/13 143 lb 4 oz (64.978 kg)   Body mass index is 18.34 kg/(m^2).  B12 shot today. Lab Results  Component Value Date   VITAMINB12 276 07/14/2014    Relevant past medical, surgical, family and social history reviewed and updated as indicated.  Allergies and medications reviewed and updated. Current Outpatient Prescriptions on File Prior to Visit  Medication Sig  . aspirin 81 MG tablet Take 81 mg by mouth daily.  . cyanocobalamin (,VITAMIN B-12,) 1000 MCG/ML injection Inject 1 mL (1,000 mcg  total) into the muscle every 30 (thirty) days. Start 10/2013  . diphenhydrAMINE (BENADRYL) 25 MG tablet Take 25 mg by mouth daily as needed.   No current facility-administered medications on file prior to visit.   Past Medical History  Diagnosis Date  . Benign paroxysmal positional vertigo   . History of shingles 2010  . Sarcoidosis   . Lentigo maligna of right cheek 2015    s/p Mohs ([facility])  . B12 deficiency anemia   . Lymphoma, small lymphocytic 2015    (onc at [facility])    Review of Systems Per HPI unless specifically indicated above    Objective:    BP 124/78  Pulse 72  Temp(Src) 97.6 F (36.4 C) (Oral)  Wt 135 lb 4 oz (61.349 kg)  Physical Exam  Nursing note and vitals reviewed. Constitutional: He appears well-developed and well-nourished. No distress.  thin  HENT:  Mouth/Throat: Oropharynx is clear and moist. No oropharyngeal exudate.  Eyes: Conjunctivae and EOM are normal. Pupils are equal, round, and reactive to light.  Neck: Normal range of motion. Neck supple.  Cardiovascular: Regular rhythm, normal heart sounds and intact distal pulses.  Tachycardia present.   No murmur heard. Pulmonary/Chest: Effort normal and breath sounds normal. No respiratory distress. He has no wheezes. He has no rales.  Musculoskeletal: He exhibits no edema.  Skin: Skin is warm and dry. No rash noted.  Psychiatric: He has a normal mood and affect.  Assessment & Plan:   Problem List Items Addressed This Visit   Urinary urgency     Increased urgency/frequency over last month. Check UA today - not consistent with infection.    Relevant Orders      POCT Urinalysis Dipstick (Completed)   Protein-calorie malnutrition, mild     Check Albumin - 3.1 consistent with mild protein malnutrition after recent hospitalizations and rehab stays. rec start ensure/boost 1/2 to 1 can daily 30 min prior to lunch as orexigenic.    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    Relevant Medications      cyanocobalamin ((VITAMIN B-12)) injection 1,000 mcg (Completed)   Other Relevant Orders      Vitamin B12 (Completed)   Lymphoma, small lymphocytic     To establish with new onc at [facility]. Appreciate onc care of patient. Seems to have responded well to chemotherapy.    Lentigo maligna of right cheek     S/p removal by derm (mohs), faint scar present.    History of pneumonia     S/p rehab stay. Seems to be doing well today. Lungs clear.    Generalized lymphadenopathy     This has largely resolved after chemotherapy.    Chronic kidney disease      Recheck renal panel today. Check alb to eval for malnutrition Lab Results  Component Value Date   CREATININE 1.7* 11/10/2013      Relevant Orders      CBC with Differential (Completed)      Renal function panel (Completed)   C. difficile diarrhea     With some residual diarrhea but overall improving every day. Discussed ok to use immodium sparingly, if persistent diarrhea to update me for further evaluation.    Relevant Medications      nystatin (MYCOSTATIN/NYSTOP) 100000 UNIT/GM POWD       Follow up plan: Return in about 3 months (around 10/14/2014), or as needed, for follow up visit.   ADDENDUM ==> Received D/C summary from [facility] Dx: septic shock from PNA, ARF on CKD.  8/10-14/2015 D/C Cr 1.3, K 3.1, Ca 7.1, Alb 2.0, Hgb 10.7, plt 249k blcx pansensitive pseudomonas Neutropenia resolved with neupogen which was then discontinued.

## 2014-07-14 NOTE — Assessment & Plan Note (Addendum)
Increased urgency/frequency over last month. Check UA today - not consistent with infection.

## 2014-07-14 NOTE — Assessment & Plan Note (Signed)
Check Albumin - 3.1 consistent with mild protein malnutrition after recent hospitalizations and rehab stays. rec start ensure/boost 1/2 to 1 can daily 30 min prior to lunch as orexigenic.

## 2014-07-14 NOTE — Telephone Encounter (Signed)
Critical lab WBC 26.4, manual diff being performed. Results given to Dr. Darnell Level, his other labs are pending completion to be resulted in EMR.

## 2014-07-14 NOTE — Assessment & Plan Note (Signed)
Recheck levels - CBC, B12 and then provide with B12 shot today.

## 2014-07-15 DIAGNOSIS — I482 Chronic atrial fibrillation: Secondary | ICD-10-CM | POA: Diagnosis not present

## 2014-07-15 DIAGNOSIS — J189 Pneumonia, unspecified organism: Secondary | ICD-10-CM | POA: Diagnosis not present

## 2014-07-15 DIAGNOSIS — N189 Chronic kidney disease, unspecified: Secondary | ICD-10-CM | POA: Diagnosis not present

## 2014-07-15 DIAGNOSIS — D649 Anemia, unspecified: Secondary | ICD-10-CM | POA: Diagnosis not present

## 2014-07-15 DIAGNOSIS — J918 Pleural effusion in other conditions classified elsewhere: Secondary | ICD-10-CM | POA: Diagnosis not present

## 2014-07-15 DIAGNOSIS — I129 Hypertensive chronic kidney disease with stage 1 through stage 4 chronic kidney disease, or unspecified chronic kidney disease: Secondary | ICD-10-CM | POA: Diagnosis not present

## 2014-07-18 ENCOUNTER — Telehealth: Payer: Self-pay

## 2014-07-18 DIAGNOSIS — N189 Chronic kidney disease, unspecified: Secondary | ICD-10-CM | POA: Diagnosis not present

## 2014-07-18 DIAGNOSIS — I129 Hypertensive chronic kidney disease with stage 1 through stage 4 chronic kidney disease, or unspecified chronic kidney disease: Secondary | ICD-10-CM | POA: Diagnosis not present

## 2014-07-18 DIAGNOSIS — J918 Pleural effusion in other conditions classified elsewhere: Secondary | ICD-10-CM | POA: Diagnosis not present

## 2014-07-18 DIAGNOSIS — I482 Chronic atrial fibrillation: Secondary | ICD-10-CM | POA: Diagnosis not present

## 2014-07-18 DIAGNOSIS — J189 Pneumonia, unspecified organism: Secondary | ICD-10-CM | POA: Diagnosis not present

## 2014-07-18 DIAGNOSIS — D649 Anemia, unspecified: Secondary | ICD-10-CM | POA: Diagnosis not present

## 2014-07-18 NOTE — Telephone Encounter (Signed)
Amy nurse with [facility] left v/m; BP today was 94/60 P 100; pt states he feels fine. Pt still having diarrhea, pt taking immodium; Amy advised pt to eat and drink a little more. Amy still hears coarse lung sounds in lt lower lobe; pt going to use nebulizer. Amy will see pt again on 07/21/14.Please advise. Amy request cb if further orders or any else Dr Darnell Level wants Amy to do. [facility] and will have labs done there.

## 2014-07-19 MED ORDER — DILTIAZEM HCL 60 MG PO TABS: 60.0000 mg | ORAL_TABLET | Freq: Every day | ORAL | Status: DC

## 2014-07-19 NOTE — Telephone Encounter (Signed)
Noted. Let's decrease diltiazem to 60mg  once daily. New dose sent to pharmacy. May cut current dose in half. If persistent coarse breath sounds or cough, recommend CXR.

## 2014-07-19 NOTE — Telephone Encounter (Signed)
Amy notified and verbalized understanding 

## 2014-07-20 ENCOUNTER — Ambulatory Visit: Payer: Self-pay | Admitting: Internal Medicine

## 2014-07-20 DIAGNOSIS — Z7982 Long term (current) use of aspirin: Secondary | ICD-10-CM | POA: Diagnosis not present

## 2014-07-20 DIAGNOSIS — Z79899 Other long term (current) drug therapy: Secondary | ICD-10-CM | POA: Diagnosis not present

## 2014-07-20 DIAGNOSIS — C9110 Chronic lymphocytic leukemia of B-cell type not having achieved remission: Secondary | ICD-10-CM | POA: Diagnosis not present

## 2014-07-20 DIAGNOSIS — N289 Disorder of kidney and ureter, unspecified: Secondary | ICD-10-CM | POA: Diagnosis not present

## 2014-07-20 LAB — CBC CANCER CENTER
BASOS PCT: 0.3 %
Basophil #: 0 x10 3/mm (ref 0.0–0.1)
EOS PCT: 0.8 %
Eosinophil #: 0.1 x10 3/mm (ref 0.0–0.7)
HCT: 39.3 % — ABNORMAL LOW (ref 40.0–52.0)
HGB: 12.8 g/dL — ABNORMAL LOW (ref 13.0–18.0)
Lymphocyte #: 1.1 x10 3/mm (ref 1.0–3.6)
Lymphocyte %: 9.1 %
MCH: 30.8 pg (ref 26.0–34.0)
MCHC: 32.6 g/dL (ref 32.0–36.0)
MCV: 95 fL (ref 80–100)
Monocyte #: 0.6 x10 3/mm (ref 0.2–1.0)
Monocyte %: 5.3 %
NEUTROS PCT: 84.5 %
Neutrophil #: 10.3 x10 3/mm — ABNORMAL HIGH (ref 1.4–6.5)
PLATELETS: 405 x10 3/mm (ref 150–440)
RBC: 4.16 10*6/uL — AB (ref 4.40–5.90)
RDW: 17 % — ABNORMAL HIGH (ref 11.5–14.5)
WBC: 12.2 x10 3/mm — ABNORMAL HIGH (ref 3.8–10.6)

## 2014-07-20 LAB — COMPREHENSIVE METABOLIC PANEL
ALBUMIN: 2.9 g/dL — AB (ref 3.4–5.0)
ANION GAP: 5 — AB (ref 7–16)
Alkaline Phosphatase: 128 U/L — ABNORMAL HIGH
BUN: 18 mg/dL (ref 7–18)
Bilirubin,Total: 0.4 mg/dL (ref 0.2–1.0)
CALCIUM: 8.4 mg/dL — AB (ref 8.5–10.1)
CHLORIDE: 104 mmol/L (ref 98–107)
Co2: 30 mmol/L (ref 21–32)
Creatinine: 1.2 mg/dL (ref 0.60–1.30)
EGFR (African American): >60
Glucose: 99 mg/dL (ref 65–99)
OSMOLALITY: 279 (ref 275–301)
POTASSIUM: 4.3 mmol/L (ref 3.5–5.1)
SGOT(AST): 15 U/L (ref 15–37)
SGPT (ALT): 16 U/L
Sodium: 139 mmol/L (ref 136–145)
TOTAL PROTEIN: 6.4 g/dL (ref 6.4–8.2)

## 2014-07-21 DIAGNOSIS — I482 Chronic atrial fibrillation: Secondary | ICD-10-CM | POA: Diagnosis not present

## 2014-07-21 DIAGNOSIS — I129 Hypertensive chronic kidney disease with stage 1 through stage 4 chronic kidney disease, or unspecified chronic kidney disease: Secondary | ICD-10-CM | POA: Diagnosis not present

## 2014-07-21 DIAGNOSIS — D649 Anemia, unspecified: Secondary | ICD-10-CM | POA: Diagnosis not present

## 2014-07-21 DIAGNOSIS — J189 Pneumonia, unspecified organism: Secondary | ICD-10-CM | POA: Diagnosis not present

## 2014-07-21 DIAGNOSIS — J918 Pleural effusion in other conditions classified elsewhere: Secondary | ICD-10-CM | POA: Diagnosis not present

## 2014-07-21 DIAGNOSIS — N189 Chronic kidney disease, unspecified: Secondary | ICD-10-CM | POA: Diagnosis not present

## 2014-07-26 DIAGNOSIS — I129 Hypertensive chronic kidney disease with stage 1 through stage 4 chronic kidney disease, or unspecified chronic kidney disease: Secondary | ICD-10-CM | POA: Diagnosis not present

## 2014-07-26 DIAGNOSIS — I482 Chronic atrial fibrillation: Secondary | ICD-10-CM

## 2014-07-26 DIAGNOSIS — J918 Pleural effusion in other conditions classified elsewhere: Secondary | ICD-10-CM | POA: Diagnosis not present

## 2014-07-26 DIAGNOSIS — J189 Pneumonia, unspecified organism: Secondary | ICD-10-CM

## 2014-07-27 DIAGNOSIS — J189 Pneumonia, unspecified organism: Secondary | ICD-10-CM | POA: Diagnosis not present

## 2014-07-27 DIAGNOSIS — I482 Chronic atrial fibrillation: Secondary | ICD-10-CM | POA: Diagnosis not present

## 2014-07-27 DIAGNOSIS — D649 Anemia, unspecified: Secondary | ICD-10-CM | POA: Diagnosis not present

## 2014-07-27 DIAGNOSIS — J918 Pleural effusion in other conditions classified elsewhere: Secondary | ICD-10-CM | POA: Diagnosis not present

## 2014-07-27 DIAGNOSIS — N189 Chronic kidney disease, unspecified: Secondary | ICD-10-CM | POA: Diagnosis not present

## 2014-07-27 DIAGNOSIS — I129 Hypertensive chronic kidney disease with stage 1 through stage 4 chronic kidney disease, or unspecified chronic kidney disease: Secondary | ICD-10-CM | POA: Diagnosis not present

## 2014-07-31 ENCOUNTER — Ambulatory Visit: Payer: Self-pay | Admitting: Hematology and Oncology

## 2014-07-31 DIAGNOSIS — D649 Anemia, unspecified: Secondary | ICD-10-CM | POA: Diagnosis not present

## 2014-07-31 DIAGNOSIS — C9110 Chronic lymphocytic leukemia of B-cell type not having achieved remission: Secondary | ICD-10-CM | POA: Diagnosis not present

## 2014-07-31 DIAGNOSIS — N189 Chronic kidney disease, unspecified: Secondary | ICD-10-CM | POA: Diagnosis not present

## 2014-07-31 DIAGNOSIS — Z7982 Long term (current) use of aspirin: Secondary | ICD-10-CM | POA: Diagnosis not present

## 2014-07-31 DIAGNOSIS — J479 Bronchiectasis, uncomplicated: Secondary | ICD-10-CM | POA: Diagnosis not present

## 2014-07-31 DIAGNOSIS — Z79899 Other long term (current) drug therapy: Secondary | ICD-10-CM | POA: Diagnosis not present

## 2014-07-31 DIAGNOSIS — J90 Pleural effusion, not elsewhere classified: Secondary | ICD-10-CM | POA: Diagnosis not present

## 2014-07-31 DIAGNOSIS — R599 Enlarged lymph nodes, unspecified: Secondary | ICD-10-CM | POA: Diagnosis not present

## 2014-07-31 DIAGNOSIS — K529 Noninfective gastroenteritis and colitis, unspecified: Secondary | ICD-10-CM | POA: Diagnosis not present

## 2014-07-31 DIAGNOSIS — D472 Monoclonal gammopathy: Secondary | ICD-10-CM | POA: Diagnosis not present

## 2014-08-05 ENCOUNTER — Telehealth: Payer: Self-pay | Admitting: Family Medicine

## 2014-08-05 NOTE — Telephone Encounter (Signed)
Patient Information:  Caller Name: Eulas Post  Phone: [phone]  Patient: Harold Chavez, Harold Chavez  Gender: Male  DOB: 1923-11-16  Age: 80 Years  PCP: [facility])  Office Follow Up:  Does the office need to follow up with this patient?: No  Instructions For The Office: N/A   Symptoms  Reason For Call & Symptoms: Pt's son/Glenn states pt had a fall this morning and laceration to right forehead.  Eulas Post states pt was seen at the fire station and checked out by paramedic, BP 132/85, HR 120, no neuro s/s noted.  Eulas Post reports pt is doing fine now with no c/o pain.  Reviewed Health History In EMR: Yes  Reviewed Medications In EMR: Yes  Reviewed Allergies In EMR: Yes  Reviewed Surgeries / Procedures: Yes  Date of Onset of Symptoms: 08/05/2014  Guideline(s) Used:  Head Injury  Disposition Per Guideline:   Home Care  Reason For Disposition Reached:   Minor head injury  Advice Given:  Reassurance - Direct Blow (Contusion, Bruise)  This sounds like a scalp injury rather than a brain injury or concussion. Treatment at home should be safe. A direct blow to your scalp can cause a contusion. Contusion is the medical term for bruise.  Symptoms are mild pain, swelling, and/or bruising. Sometimes there can also be mild dizziness and nausea.  Here is some care advice that should help.  Apply a Cold Pack:  Apply a cold pack or an ice bag (wrapped in a moist towel) to the area for 20 minutes. Repeat in 1 hour, then every 4 hours while awake.  Continue this for the first 48 hours after an injury.  This will help decrease pain and swelling.  Observation After Head Injury:  Watch a person with a head injury very closely during the first 2 hours following the injury.  Wake the head-injured person every 4 hours for the first 24 hours. Check to see if he (or she) can walk and talk.  Expected Course:  Most head trauma only causes an injury to the scalp. Pain, swelling, and bruising usually  start to get better 2 to 3 days after an injury.  Swelling most often is gone after 1 week.  Bruises fade away slowly over 1-2 weeks.  It may take 2 weeks for pain and tenderness of the injured area to go away.  Diet:   Clear fluids to drink at first, in case of vomiting. May resume a regular diet after 2 hours.  Call Back If:  Severe headache  Extremity weakness or numbness occurs  Slurred speech or blurred vision occurs  Vomiting occurs  You become worse.  Reassurance - Superficial Laceration (Cut or Scratch) or Abrasion (Scrape):  This sounds like a small cut or scrape that we can treat at home.  Antibiotic Ointment  : Apply an antibiotic ointment (e.g., OTC Bacitracin) to the wound twice a day.  Patient Will Follow Care Advice:  YES

## 2014-08-05 NOTE — Telephone Encounter (Signed)
Noted, routed to PCP as FYI.

## 2014-08-06 NOTE — Telephone Encounter (Signed)
Noted thanks. Agree

## 2014-08-17 DIAGNOSIS — I482 Chronic atrial fibrillation: Secondary | ICD-10-CM | POA: Diagnosis not present

## 2014-08-17 DIAGNOSIS — N189 Chronic kidney disease, unspecified: Secondary | ICD-10-CM | POA: Diagnosis not present

## 2014-08-17 DIAGNOSIS — D649 Anemia, unspecified: Secondary | ICD-10-CM | POA: Diagnosis not present

## 2014-08-17 DIAGNOSIS — J189 Pneumonia, unspecified organism: Secondary | ICD-10-CM | POA: Diagnosis not present

## 2014-08-17 DIAGNOSIS — J918 Pleural effusion in other conditions classified elsewhere: Secondary | ICD-10-CM | POA: Diagnosis not present

## 2014-08-17 DIAGNOSIS — I129 Hypertensive chronic kidney disease with stage 1 through stage 4 chronic kidney disease, or unspecified chronic kidney disease: Secondary | ICD-10-CM | POA: Diagnosis not present

## 2014-08-19 DIAGNOSIS — D649 Anemia, unspecified: Secondary | ICD-10-CM | POA: Diagnosis not present

## 2014-08-19 DIAGNOSIS — D472 Monoclonal gammopathy: Secondary | ICD-10-CM | POA: Diagnosis not present

## 2014-08-19 DIAGNOSIS — N189 Chronic kidney disease, unspecified: Secondary | ICD-10-CM | POA: Diagnosis not present

## 2014-08-19 DIAGNOSIS — Z79899 Other long term (current) drug therapy: Secondary | ICD-10-CM | POA: Diagnosis not present

## 2014-08-19 DIAGNOSIS — C9110 Chronic lymphocytic leukemia of B-cell type not having achieved remission: Secondary | ICD-10-CM | POA: Diagnosis not present

## 2014-08-19 DIAGNOSIS — Z7982 Long term (current) use of aspirin: Secondary | ICD-10-CM | POA: Diagnosis not present

## 2014-08-19 LAB — COMPREHENSIVE METABOLIC PANEL
ALBUMIN: 3.8 g/dL (ref 3.4–5.0)
ALK PHOS: 148 U/L — AB
ALT: 32 U/L
Anion Gap: 2 — ABNORMAL LOW (ref 7–16)
BUN: 33 mg/dL — AB (ref 7–18)
Bilirubin,Total: 0.5 mg/dL (ref 0.2–1.0)
CALCIUM: 8.9 mg/dL (ref 8.5–10.1)
CO2: 33 mmol/L — AB (ref 21–32)
CREATININE: 1.2 mg/dL (ref 0.60–1.30)
Chloride: 105 mmol/L (ref 98–107)
EGFR (African American): >60
EGFR (Non-African Amer.): >60
GLUCOSE: 89 mg/dL (ref 65–99)
OSMOLALITY: 286 (ref 275–301)
Potassium: 4.9 mmol/L (ref 3.5–5.1)
SGOT(AST): 30 U/L (ref 15–37)
Sodium: 140 mmol/L (ref 136–145)
Total Protein: 7.4 g/dL (ref 6.4–8.2)

## 2014-08-19 LAB — CBC CANCER CENTER
Basophil #: 0 x10 3/mm (ref 0.0–0.1)
Basophil %: 0.4 %
EOS ABS: 0.2 x10 3/mm (ref 0.0–0.7)
Eosinophil %: 2.6 %
HCT: 41.1 % (ref 40.0–52.0)
HGB: 13.5 g/dL (ref 13.0–18.0)
LYMPHS ABS: 2.3 x10 3/mm (ref 1.0–3.6)
Lymphocyte %: 24.6 %
MCH: 31.4 pg (ref 26.0–34.0)
MCHC: 32.9 g/dL (ref 32.0–36.0)
MCV: 96 fL (ref 80–100)
MONOS PCT: 10.9 %
Monocyte #: 1 x10 3/mm (ref 0.2–1.0)
NEUTROS PCT: 61.5 %
Neutrophil #: 5.9 x10 3/mm (ref 1.4–6.5)
Platelet: 301 x10 3/mm (ref 150–440)
RBC: 4.31 10*6/uL — AB (ref 4.40–5.90)
RDW: 16.7 % — ABNORMAL HIGH (ref 11.5–14.5)
WBC: 9.5 x10 3/mm (ref 3.8–10.6)

## 2014-08-24 ENCOUNTER — Ambulatory Visit (INDEPENDENT_AMBULATORY_CARE_PROVIDER_SITE_OTHER): Payer: Medicare Other

## 2014-08-24 DIAGNOSIS — E538 Deficiency of other specified B group vitamins: Secondary | ICD-10-CM | POA: Diagnosis not present

## 2014-08-24 MED ORDER — CYANOCOBALAMIN 1000 MCG/ML IJ SOLN
1000.0000 ug | Freq: Once | INTRAMUSCULAR | Status: AC
  Administered 2014-08-24: 1000 ug via INTRAMUSCULAR

## 2014-08-30 ENCOUNTER — Ambulatory Visit: Payer: Self-pay | Admitting: Hematology and Oncology

## 2014-09-07 DIAGNOSIS — J189 Pneumonia, unspecified organism: Secondary | ICD-10-CM | POA: Diagnosis not present

## 2014-09-07 DIAGNOSIS — D649 Anemia, unspecified: Secondary | ICD-10-CM | POA: Diagnosis not present

## 2014-09-07 DIAGNOSIS — I129 Hypertensive chronic kidney disease with stage 1 through stage 4 chronic kidney disease, or unspecified chronic kidney disease: Secondary | ICD-10-CM | POA: Diagnosis not present

## 2014-09-07 DIAGNOSIS — I482 Chronic atrial fibrillation: Secondary | ICD-10-CM | POA: Diagnosis not present

## 2014-09-07 DIAGNOSIS — J918 Pleural effusion in other conditions classified elsewhere: Secondary | ICD-10-CM | POA: Diagnosis not present

## 2014-09-07 DIAGNOSIS — N189 Chronic kidney disease, unspecified: Secondary | ICD-10-CM | POA: Diagnosis not present

## 2014-09-28 ENCOUNTER — Ambulatory Visit (INDEPENDENT_AMBULATORY_CARE_PROVIDER_SITE_OTHER): Payer: Medicare Other

## 2014-09-28 DIAGNOSIS — E538 Deficiency of other specified B group vitamins: Secondary | ICD-10-CM | POA: Diagnosis not present

## 2014-09-28 MED ORDER — CYANOCOBALAMIN 1000 MCG/ML IJ SOLN
1000.0000 ug | Freq: Once | INTRAMUSCULAR | Status: AC
  Administered 2014-09-28: 1000 ug via INTRAMUSCULAR

## 2014-10-11 DIAGNOSIS — Z08 Encounter for follow-up examination after completed treatment for malignant neoplasm: Secondary | ICD-10-CM | POA: Diagnosis not present

## 2014-10-11 DIAGNOSIS — Z85820 Personal history of malignant melanoma of skin: Secondary | ICD-10-CM | POA: Diagnosis not present

## 2014-10-11 DIAGNOSIS — L570 Actinic keratosis: Secondary | ICD-10-CM | POA: Diagnosis not present

## 2014-10-11 DIAGNOSIS — L814 Other melanin hyperpigmentation: Secondary | ICD-10-CM | POA: Diagnosis not present

## 2014-10-11 DIAGNOSIS — D225 Melanocytic nevi of trunk: Secondary | ICD-10-CM | POA: Diagnosis not present

## 2014-10-12 DIAGNOSIS — H2513 Age-related nuclear cataract, bilateral: Secondary | ICD-10-CM | POA: Diagnosis not present

## 2014-10-12 DIAGNOSIS — H25013 Cortical age-related cataract, bilateral: Secondary | ICD-10-CM | POA: Diagnosis not present

## 2014-10-14 ENCOUNTER — Ambulatory Visit (INDEPENDENT_AMBULATORY_CARE_PROVIDER_SITE_OTHER): Payer: Medicare Other | Admitting: Family Medicine

## 2014-10-14 ENCOUNTER — Encounter: Payer: Self-pay | Admitting: Family Medicine

## 2014-10-14 VITALS — BP 116/70 | HR 96 | Temp 97.4°F | Wt 152.5 lb

## 2014-10-14 DIAGNOSIS — M25512 Pain in left shoulder: Secondary | ICD-10-CM | POA: Insufficient documentation

## 2014-10-14 DIAGNOSIS — N183 Chronic kidney disease, stage 3 unspecified: Secondary | ICD-10-CM

## 2014-10-14 DIAGNOSIS — D531 Other megaloblastic anemias, not elsewhere classified: Secondary | ICD-10-CM | POA: Diagnosis not present

## 2014-10-14 DIAGNOSIS — C8300 Small cell B-cell lymphoma, unspecified site: Secondary | ICD-10-CM | POA: Diagnosis not present

## 2014-10-14 DIAGNOSIS — E441 Mild protein-calorie malnutrition: Secondary | ICD-10-CM

## 2014-10-14 NOTE — Assessment & Plan Note (Addendum)
Followed by onc at [facility]. Stable

## 2014-10-14 NOTE — Assessment & Plan Note (Signed)
Appetite improved, weight gain noted. Check alb next lab.

## 2014-10-14 NOTE — Progress Notes (Signed)
BP 116/70 mmHg  Pulse 96  Temp(Src) 97.4 F (36.3 C) (Oral)  Wt 152 lb 8 oz (69.174 kg)   CC: 17mo f/u  Subjective:    Patient ID: Harold Chavez, male    DOB: 14-Jul-1924, 81 y.o.   MRN: [id number]  HPI: Harold Chavez is a 81 y.o. male presenting on 10/14/2014 for Follow-up and Arm Pain   Skin rash - established with Dr Allyson Sabal, saw him this week, chest rash thought fungal infection and treated with ketoconazole 2% cream. Also has had several areas frozen off.  B12 shot - monthly. Lab Results  Component Value Date   [id number] 07/14/2014    Ongoing L upper arm pain since flu shot during hospitalization. More noticeable with raising arm above head. No rash at area.   Diarrhea has resolved. Polyuria noted - but has increased fluid intake to stay well hydrated. No dysuria.  Recent dx small lymphocytic lymphoma followed by [facility] onc. Completed chemotherapy treatments and did very well. Onc - has upcoming appt next month.  Relevant past medical, surgical, family and social history reviewed and updated as indicated. Interim medical history since our last visit reviewed. Allergies and medications reviewed and updated. Current Outpatient Prescriptions on File Prior to Visit  Medication Sig  . aspirin 81 MG tablet Take 81 mg by mouth daily.  . cyanocobalamin (,VITAMIN B-12,) 1000 MCG/ML injection Inject 1 mL (1,000 mcg total) into the muscle every 30 (thirty) days. Start 10/2013  . diltiazem (CARDIZEM) 60 MG tablet Take 1 tablet (60 mg total) by mouth daily.  . diphenhydrAMINE (BENADRYL) 25 MG tablet Take 25 mg by mouth daily as needed.  Marland Kitchen ipratropium (ATROVENT) 0.02 % nebulizer solution Take 0.5 mg by nebulization 4 (four) times daily.  Marland Kitchen nystatin (MYCOSTATIN/NYSTOP) 100000 UNIT/GM POWD Apply topically 2 (two) times daily.   No current facility-administered medications on file prior to visit.   Past Medical History  Diagnosis Date  . Benign paroxysmal positional vertigo   .  History of shingles 2010  . Sarcoidosis   . Lentigo maligna of right cheek 2015    s/p Mohs ([place])  . B12 deficiency anemia   . Lymphoma, small lymphocytic 2015    (onc at [facility])   Review of Systems Per HPI unless specifically indicated above     Objective:    BP 116/70 mmHg  Pulse 96  Temp(Src) 97.4 F (36.3 C) (Oral)  Wt 152 lb 8 oz (69.174 kg)  Wt Readings from Last 3 Encounters:  10/14/14 152 lb 8 oz (69.174 kg)  07/14/14 135 lb 4 oz (61.349 kg)  12/06/13 143 lb (64.864 kg)    Physical Exam  Constitutional: He appears well-developed and well-nourished. No distress.  HENT:  Head: Normocephalic and atraumatic.  Mouth/Throat: Oropharynx is clear and moist. No oropharyngeal exudate.  Eyes: Conjunctivae and EOM are normal. Pupils are equal, round, and reactive to light. No scleral icterus.  Neck: Normal range of motion. Neck supple.  Cardiovascular: Normal rate, regular rhythm, normal heart sounds and intact distal pulses.   No murmur heard. Pulmonary/Chest: Effort normal and breath sounds normal. No respiratory distress. He has no wheezes. He has no rales.  Musculoskeletal: He exhibits edema (tr ankle edema).  R shoulder WNL L Shoulder exam: No deformity of shoulders on inspection. No pain with palpation of shoulder landmarks. FROM in abduction and forward flexion. No pain or weakness with testing SITS in ext/int rotation. Tender with empty can sign. No impingement.  Lymphadenopathy:  He has no cervical adenopathy.  Skin: Rash (erythematous macular rash with scaling bilateral chest ) noted.  Nursing note and vitals reviewed.  Results for orders placed or performed in visit on 07/14/14  Vitamin B12  Result Value Ref Range   Vitamin B-12 276 211 - 911 pg/mL  CBC with Differential  Result Value Ref Range   WBC 26.4 Repeated and verified X2. (HH) 4.0 - 10.5 K/uL   RBC 3.88 (L) 4.22 - 5.81 Mil/uL   Hemoglobin 12.1 (L) 13.0 - 17.0 g/dL   HCT 36.3 (L) 39.0  - 52.0 %   MCV 93.7 78.0 - 100.0 fl   MCHC 33.3 30.0 - 36.0 g/dL   RDW 18.7 (H) 11.5 - 15.5 %   Platelets 391.0 150.0 - 400.0 K/uL   Neutrophils Relative % 84.7 (H) 43.0 - 77.0 %   Lymphocytes Relative 8.8 (L) 12.0 - 46.0 %   Monocytes Relative 6.1 3.0 - 12.0 %   Eosinophils Relative 0.2 0.0 - 5.0 %   Basophils Relative 0.2 0.0 - 3.0 %   Neutro Abs 22.5 (H) 1.4 - 7.7 K/uL   Lymphs Abs 2.3 0.7 - 4.0 K/uL   Monocytes Absolute 1.6 (H) 0.1 - 1.0 K/uL   Eosinophils Absolute 0.0 0.0 - 0.7 K/uL   Basophils Absolute 0.0 0.0 - 0.1 K/uL  Renal function panel  Result Value Ref Range   Sodium 137 135 - 145 mEq/L   Potassium 4.0 3.5 - 5.1 mEq/L   Chloride 102 96 - 112 mEq/L   CO2 29 19 - 32 mEq/L   Calcium 8.7 8.4 - 10.5 mg/dL   Albumin 3.1 (L) 3.5 - 5.2 g/dL   BUN 17 6 - 23 mg/dL   Creatinine, Ser 1.1 0.4 - 1.5 mg/dL   Glucose, Bld 89 70 - 99 mg/dL   Phosphorus 2.7 2.3 - 4.6 mg/dL   GFR 69.31 >60.00 mL/min  POCT Urinalysis Dipstick  Result Value Ref Range   Color, UA Yellow    Clarity, UA Hazy    Glucose, UA Negative    Bilirubin, UA Negative    Ketones, UA Negative    Spec Grav, UA 1.020    Blood, UA Trace    pH, UA 6.0    Protein, UA Trace    Urobilinogen, UA 0.2    Nitrite, UA Negative    Leukocytes, UA Negative       Assessment & Plan:   Problem List Items Addressed This Visit    Protein-calorie malnutrition, mild    Appetite improved, weight gain noted. Check alb next lab.      Megaloblastic anemia due to B12 deficiency    Continue monthly B12 shots.      Lymphoma, small lymphocytic    Followed by onc at [facility] Stable      Left shoulder pain - Primary    Not consistent with bursitis, skin ok. Anticipate RTC irritation specifically supraspinatus. Treat with stretching exercises from SM pt advisor.  Update if persistent discomfort      Chronic kidney disease    Cr actually markedly improved on last check. Will recheck next visit.          Follow up  plan: Return in about 6 months (around 04/14/2015), or as needed, for follow up visit.

## 2014-10-14 NOTE — Assessment & Plan Note (Signed)
Not consistent with bursitis, skin ok. Anticipate RTC irritation specifically supraspinatus. Treat with stretching exercises from [facility] pt advisor.  Update if persistent discomfort

## 2014-10-14 NOTE — Assessment & Plan Note (Signed)
Cr actually markedly improved on last check. Will recheck next visit.

## 2014-10-14 NOTE — Patient Instructions (Addendum)
Try exercises for shoulder.  You are doing well today!  Return as needed or in 6-8 months for follow up.

## 2014-10-14 NOTE — Progress Notes (Signed)
Pre visit review using our clinic review tool, if applicable. No additional management support is needed unless otherwise documented below in the visit note. 

## 2014-10-14 NOTE — Assessment & Plan Note (Signed)
Continue monthly B12 shots.  

## 2014-11-01 ENCOUNTER — Ambulatory Visit (INDEPENDENT_AMBULATORY_CARE_PROVIDER_SITE_OTHER): Payer: Medicare Other | Admitting: *Deleted

## 2014-11-01 DIAGNOSIS — D519 Vitamin B12 deficiency anemia, unspecified: Secondary | ICD-10-CM

## 2014-11-01 MED ORDER — CYANOCOBALAMIN 1000 MCG/ML IJ SOLN
1000.0000 ug | Freq: Once | INTRAMUSCULAR | Status: AC
  Administered 2014-11-01: 1000 ug via INTRAMUSCULAR

## 2014-11-17 ENCOUNTER — Ambulatory Visit: Payer: Self-pay | Admitting: Hematology and Oncology

## 2014-11-17 DIAGNOSIS — C9110 Chronic lymphocytic leukemia of B-cell type not having achieved remission: Secondary | ICD-10-CM | POA: Diagnosis not present

## 2014-11-17 DIAGNOSIS — Z79899 Other long term (current) drug therapy: Secondary | ICD-10-CM | POA: Diagnosis not present

## 2014-11-17 DIAGNOSIS — Z9221 Personal history of antineoplastic chemotherapy: Secondary | ICD-10-CM | POA: Diagnosis not present

## 2014-11-29 ENCOUNTER — Ambulatory Visit
Admit: 2014-11-29 | Disposition: A | Discharge status: Home / Self Care | Payer: Self-pay | Attending: Hematology and Oncology | Admitting: Hematology and Oncology

## 2014-11-30 ENCOUNTER — Other Ambulatory Visit: Payer: Self-pay | Admitting: *Deleted

## 2014-11-30 MED ORDER — DILTIAZEM HCL 60 MG PO TABS: 60.0000 mg | ORAL_TABLET | Freq: Every day | ORAL | Status: DC

## 2014-12-02 ENCOUNTER — Ambulatory Visit (INDEPENDENT_AMBULATORY_CARE_PROVIDER_SITE_OTHER): Payer: Medicare Other | Admitting: *Deleted

## 2014-12-02 DIAGNOSIS — E538 Deficiency of other specified B group vitamins: Secondary | ICD-10-CM | POA: Diagnosis not present

## 2014-12-02 MED ORDER — CYANOCOBALAMIN 1000 MCG/ML IJ SOLN
1000.0000 ug | Freq: Once | INTRAMUSCULAR | Status: AC
  Administered 2014-12-02: 1000 ug via INTRAMUSCULAR

## 2015-01-03 ENCOUNTER — Ambulatory Visit (INDEPENDENT_AMBULATORY_CARE_PROVIDER_SITE_OTHER): Payer: Medicare Other | Admitting: *Deleted

## 2015-01-03 DIAGNOSIS — D519 Vitamin B12 deficiency anemia, unspecified: Secondary | ICD-10-CM | POA: Diagnosis not present

## 2015-01-03 MED ORDER — CYANOCOBALAMIN 1000 MCG/ML IJ SOLN
1000.0000 ug | Freq: Once | INTRAMUSCULAR | Status: AC
  Administered 2015-01-03: 1000 ug via INTRAMUSCULAR

## 2015-01-17 LAB — CREATININE, SERUM: Creatine, Serum: 1.48

## 2015-01-21 NOTE — Consult Note (Signed)
HEMATOLOGY followup -sitting in bed, still on [place] O2 but overall feels better. No fevers. no bleeding symptoms. Denies pain issues.patient alert and oriented, NAD. On [place] O2.            vitals - afebrile, stable, 95% on RA            lungs - decreased BS at bases            abd - soft, NTWBC 3500, Hb 11.1, platelets 240K, ANC 2400, Cr 3.96.   81 year old gentleman with known history of chronic lymphocytic leukemia,  recently completed cycle 5 chemotherapy with Rituxan/Treanda at the end of July 2015. He was admitted with fevers, weakness, significant leukopenia/neutropenia (from recent chemotherapy) and pneumonia, hypotension, and has gram-negative rods growing on blood cultures. WBC/ANC has responded well to Neupogen support. Clinically feels better overall and remains afebrile today. Agree with ongoing broad spectrum antibiotic coverage. Patient has completed 5/6 cycles of chemotherapy, but have explained that it can be discussed after discharge if he wants to continue on treatment or keep on surveillance. Otherwise continue to monitor daily CBC and consider transfusion support as indicated by labs and symptoms. Patient and family present are agreeable to this plan.       Electronic Signatures: Jonn Shingles (MD)  (Signed on 13-Aug-15 23:51)  Authored  Last Updated: 13-Aug-15 23:51 by Jonn Shingles (MD)

## 2015-01-21 NOTE — Consult Note (Signed)
Chief Complaint:  Subjective/Chief Complaint Diarrhea much better today after going back to IV flagyl. Still on oral vanco.   VITAL SIGNS/ANCILLARY NOTES: **Vital Signs.:   16-Sep-15 13:37  Vital Signs Type Routine  Temperature Temperature (F) 98.2  Celsius 36.7  Pulse Pulse 110  Respirations Respirations 10  Systolic BP Systolic BP 95  Diastolic BP (mmHg) Diastolic BP (mmHg) 62  Mean BP 73  Pulse Ox % Pulse Ox % 92  Pulse Ox Activity Level  At rest  Oxygen Delivery 2L   Brief Assessment:  GEN no acute distress   Cardiac Regular   Respiratory clear BS   Gastrointestinal Normal   Gastrointestinal details normal Soft  Nontender   Lab Results: Routine Micro:  15-Sep-15 16:23   Micro Text Report WBCS, STOOL   COMMENT                   0-5/OIL IMMERSION FIELD WBC'S SEEN   COMMENT                   0-5/OIL IMMERSION FIELD RBC'S SEEN   COMMENT                   100% POLYMORPHONUCLEAR CELLS   ANTIBIOTIC                        Micro Text Report CLOSTRIDIUM DIFFICILE   C.DIFFICILE ANTIGEN       C.DIFFICILE GDH ANTIGEN : POSITIVE   C.DIFFICILE TOXIN A/B     C.DIFFICILE TOXINS A AND B : NEGATIVE   PCR FOR TOXIGENIC C.DIFF  PCR FOR TOXIGENIC C.DIFFICILE : NEGATIVE   INTERPRETATION Negative for toxigenic  C. difficile. Toxin gene and active toxin production not detected. May be a nontoxigenic strain of C. difficile bacteria present, lacking the ability to produce toxin.    ANTIBIOTIC                         Assessment/Plan:  Assessment/Plan:  Assessment C.diff. Stable.   Plan Continue IV flagyl for few more days. Continue po vanco. If diarrhea recurs after switching to po flagyl, then order stool for repeat C.diff. Otherwise, will sign off. Thanks.   Electronic Signatures: Verdie Shire (MD)  (Signed 16-Sep-15 16:48)  Authored: Chief Complaint, VITAL SIGNS/ANCILLARY NOTES, Brief Assessment, Lab Results, Assessment/Plan   Last Updated: 16-Sep-15 16:48 by Verdie Shire (MD)

## 2015-01-21 NOTE — Consult Note (Signed)
PATIENT NAME:  Harold Chavez, Harold Chavez MR#:  [id number] DATE OF BIRTH:  [birth year]  DATE OF CONSULTATION:  06/10/2014  ATTENDING GASTROENTEROLOGIST: Lupita Dawn. Candace Cruise, MD  REFERRING PHYSICIAN:  Dr. Larrie Kass PROVIDER:  Corky Sox. Kasim Mccorkle, PA-C  REASON FOR CONSULTATION: C. difficile colitis.   HISTORY OF PRESENT ILLNESS: This is a pleasant, 81 year old gentleman accompanied by a family member at bedside, who is admitted with C. difficile diarrhea. He does have a history of CLL and has been undergoing chemotherapy treatments, most recently on 07/29, which was his fifth of 6 total cycles. He was later admitted to the hospital in mid August with septic shock and pneumonia. Of note, his white blood cells were normal at the time of discharge. Unfortunately, the patient has developed diarrhea since that discharge and he was found to be C. difficile positive. During this hospitalization, he has been somewhat hypotensive and tachycardic with elevated white blood cells, currently 44.5. He was started on p.o. Flagyl 500 mg 3 times a day as well as a probiotic. He had been on these antibiotics now for about 48 hours, but unfortunately has not seen any improvement in his diarrhea.   He is moving his bowels about every 2 to 3 hours and it is described as loose and watery. He denies noticing any blood in his stool. He is denying any abdominal pain or tenderness. His biggest complaint after having the diarrhea is actually a loss of appetite. There is no nausea or vomiting. There is no dysphagia. He just states that he is not feeling hungry. There have been no unintentional weight changes. No chest pain or shortness of breath. He was febrile at home, but has been afebrile here at the hospital at least over the past 24 hours. He cannot recall if he has ever had a colonoscopy in the past.   PAST MEDICAL HISTORY: CLL, currently working with the cancer center and undergoing chemo treatments. The most recent chemo treatment was  04/27/2014 and this was the fifth of 6 total cycles. Also history of chronic kidney disease, anemia and IgG gammopathy.   PAST SURGICAL HISTORY: None.   FAMILY HISTORY: The patient denies any known family history of GI malignancy, colon polyps or IBD.   ALLERGIES: No known drug allergies.   HOME MEDICATIONS: Baby aspirin.   SOCIAL HISTORY: The patient denies any tobacco use. He denies alcohol use. No illicit drug use.   REVIEW OF SYSTEMS: A 10 system review of systems was obtained on the patient. Pertinent positives are mentioned above and otherwise negative.   PHYSICAL EXAMINATION: VITAL SIGNS: Blood pressure 91/58, heart rate 117, respirations 20, temperature 98.2, bedside pulse oximetry 93%.  GENERAL: This is a pleasant, 81 year old gentleman resting quietly and comfortably in bed, accompanied by his son at bedside and in no acute distress. Alert and oriented x 3.  HEAD: Atraumatic, normocephalic.  NECK: Supple. No lymphadenopathy noted. HEENT: Sclerae anicteric. Mucous membranes moist.  LUNGS: Respirations are even and unlabored. Clear to auscultation in bilateral anterior lung fields.  HEART: Regular rate and rhythm. S1, S2 noted.  ABDOMEN: Soft, nontender, nondistended. Normoactive bowel sounds noted in all 4 quadrants. No guarding or rebound. No masses, hernias or organomegaly appreciated.  PSYCHIATRIC: Appropriate mood and affect.  RECTAL: Deferred.  EXTREMITIES: Negative for lower extremity edema, 2+ pulses noted in the bilateral upper extremities.   LABORATORY DATA: White blood cells 44.5, hemoglobin 11.8, hematocrit 36.6, platelets 350,000. MCV 95. Sodium 136, potassium 4.3, BUN 23, creatinine 1.39, glucose  68, bilirubin 0.5, alkaline phosphatase 100, ALT 8, AST 19, albumin 2.3. Blood cultures were negative. Troponins were negative. C. difficile was positive.   ASSESSMENT: 1. Clostridium difficile diarrhea.  2. Leukocytosis.  3. Hypotension and tachycardia.  4. History of  chronic lymphocytic leukemia, currently working with the cancer center undergoing chemotherapy; most recent treatment was July 29th.  5. Recent history of septic shock and pneumonia during hospital stay in the middle of August 2015.   PLAN: I have discussed this patient's case in detail with Dr. Verdie Shire, who was involved in the development of the patient's plan of care. Certainly, because his diarrhea has not seen any improvement with 2 days of antibiotics thus far, we do recommend switching his Flagyl therapy to IV as opposed to p.o. He will be on Flagyl IV 500 mg 3 times a day. We will also add p.o. vancomycin 250 mg 4 times daily. This regimen was explained in detail to the patient, who verbalized understanding and all questions were answered. He is okay to have a diet as tolerated. Continue to monitor his white blood cells closely. We will continue to follow this patient and make further recommendations pending his response to the above and per clinical course.   Thank you so much for this consultation and for allowing [place] to participate in the patient's plan of care.     ____________________________ Corky Sox. Alphonse Asbridge, PA-C kme:TT D: 06/10/2014 12:51:56 ET T: 06/10/2014 13:09:54 ET JOB#: [id number]  cc: Corky Sox. Jailen Lung, PA-C, <Dictator> [place] PA ELECTRONICALLY SIGNED 06/10/2014 14:56

## 2015-01-21 NOTE — H&P (Signed)
PATIENT NAME:  Harold Chavez, Harold Chavez MR#:  [id number] DATE OF BIRTH:  08/03/1924  DATE OF ADMISSION:  05/09/2014  PRIMARY CARE PHYSICIAN: Dr. Danise Mina.  REFERRING PHYSICIAN: Dr. Edd Fabian.  CHIEF COMPLAINT: Generalized weakness 2 days.   HISTORY OF PRESENT ILLNESS: An 81 year old Caucasian male with a history of CLL, anemia, CKD, IgG, gammopathy, was sent to ED from home due to generalized weakness 2 days. The patient is alert, awake, oriented, in no acute distress. According to the patient's son, the patient was fine until 2 days ago. The patient started to have generalized weakness, unable to walk, has chills and a mild cough. In addition, the patient's is unable to control his urine but the patient denies any fever, chest pain. Denies any dysuria or hematuria. Actually the patient was diagnosed with CLL this February, started chemotherapy. He just completed the sixth cycle of chemotherapy last week. The patient was noticed to have a low blood pressure at 80s, was treated with normal saline bolus so far 4 liters. Blood pressure is still at 80s. The patient's chest x-ray showed right-sided pneumonia was treated with Levaquin.   PAST MEDICAL HISTORY: CLL, anemia, CKD, IgG, gammopathy  PAST SURGICAL HISTORY: None.   FAMILY HISTORY: No hypertension, diabetes, heart attack, or stroke. No cancer history.   ALLERGIES: None.   HOME MEDICATIONS: Aspirin 81 mg p.o. daily.   REVIEW OF SYSTEMS: CONSTITUTIONAL: The patient denies any fever, but has chills, no headache but has dizziness and generalized weakness.  EYES: No double vision, blurred vision.  ENT: No postnasal drip, slurred speech or dysphagia.  CARDIOVASCULAR: No chest pain, palpitation, orthopnea, nocturnal dyspnea. No leg edema.  PULMONARY: Positive for cough, no sputum, shortness of breath, or hemoptysis.  GASTROINTESTINAL: No abdominal pain, nausea, vomiting, diarrhea. No melena or bloody stool.  GENITOURINARY: No dysuria, hematuria, but has  incontinence.  SKIN: No rash or jaundice.  NEUROLOGY: No syncope, loss of consciousness, or seizure.  ENDOCRINE: No polyuria or polydipsia, heat or cold intolerance.  HEMATOLOGY: No easy bruising, bleeding.   PHYSICAL EXAMINATION:  VITAL SIGNS: Temperature 98.3, blood pressure 90s over 65, pulse 103, oxygen saturation 96% on room air.  GENERAL: The patient is alert, awake, oriented, in no acute distress.  HEENT: Pupils round, equal, reactive to light, accommodation. Moist oral mucosa. Clear oropharynx.  NECK: Supple. No JVD or carotid bruits. No lymphadenopathy. No thyromegaly.  CARDIOVASCULAR: S1, S2, regular rate and rhythm. No murmurs, gallops.  PULMONARY: Bilateral air entry. No wheezing, but has some crackles. No use of accessory muscles to breathe.  ABDOMEN: Soft. No distention or tenderness. No organomegaly. Bowel sounds present.  EXTREMITIES: No edema, clubbing, or cyanosis. No calf tenderness. Bilateral pedal pulses present.  SKIN: No rash or jaundice.  NEUROLOGIC: A and O x3. No focal deficit. Power 5/5. Sensation intact.   LABORATORY DATA: Glucose 96, BUN 50, creatinine 2.74, sodium 135, potassium 4.5, chloride 103, bicarbonate 25, calcium 7.7.   Urinalysis is negative. WBC 0.7. It was 5.3 last month. Hemoglobin 11.0, platelets of 205,000.   Chest x-ray showed questionable small focus of infiltrate on the right upper lobe. No developmental pulmonary nodule not excluded.   IMPRESSIONS:  1. Septic shock.  2. Pneumonia.  3. Acute renal failure on chronic kidney disease.  4. Hyponatremia.  5. Neutropenia.  6. Anemia.  7. Chronic lymphocytic leukemia.   PLAN OF TREATMENT:  1. The patient will be admitted to a stepdown unit. We will continue normal saline support, Levophed p.r.n. 2. Follow up  BMP, kidney ultrasound due to acute renal failure.  3. I will start Zosyn, Zithromax, vancomycin. Follow up blood culture, sputum culture, CBC. 4. For neutropenia, I will start  neutropenia isolation, give Neupogen. Follow up CBC and get an oncology consult.  4. Discussed the patient's critical condition with the patient and patient's sons.   CODE STATUS: The patient wants full code.  CRITICAL TIME SPENT: About 66 minutes.     ____________________________ Demetrios Loll, MD qc:lt D: 05/09/2014 17:20:42 ET T: 05/09/2014 18:51:25 ET JOB#: [id number]  cc: Demetrios Loll, MD, <Dictator> Demetrios Loll MD ELECTRONICALLY SIGNED 05/12/2014 21:36

## 2015-01-21 NOTE — Consult Note (Signed)
PATIENT NAME:  Harold Chavez, Harold Chavez MR#:  [id number] DATE OF BIRTH:  Mar 28, 1924  DATE OF CONSULTATION:  05/10/2014  REFERRING PHYSICIAN:   CONSULTING PHYSICIAN:  Leahmarie Gasiorowski R. Ma Hillock, MD  REASON FOR CONSULTATION: Neutropenia, on chemotherapy for CLL.  HISTORY OF PRESENT ILLNESS: The patient is an 81 year old gentleman with past medical history significant for chronic kidney disease, anemia, chronic lymphocytic leukemia on chemotherapy with Rituxan plus Treanda, received cycle 5 treatment by Dr. Kallie Edward towards end of july 2015 . Patient has been in the hospital with generalized weakness for 2 days. Also states that he may have had some fevers at home. He presented with hypotension and blood pressure in the 80s. Chest x-ray reported right-sided pneumonia. States that he is beginning to feel better, although still has mild dyspnea and generalised weakness. He has fever today. Trying to eat better. Denies any bleeding issues. CBC showed significant neutropenia upon admission and he has been started on Neupogen yesterday.  PAST MEDICAL HISTORY:  as in HPI above.   FAMILY HISTORY: noncontributory, denies hematological disorders.   SOCIAL HISTORY: Denies smoking or alcohol use.  ALLERGIES:  NO KNOWN DRUG ALLERGIES.  MEDICATIONS: Aspirin 81 mg daily.  REVIEW OF SYSTEMS:   CONSTITUTIONAL: As in history of present illness. No fever or chills today.  HEENT: Denies any headaches or current dizziness at rest. No epistaxis, ear or jaw pain.  CARDIAC: No angina, palpitation, orthopnea, or PND.  LUNGS: As in history of present illness.  GASTROINTESTINAL: No vomiting or diarrhea. No bright red blood in stools or melena.  GENITOURINARY: No dysuria or hematuria.  MUSCULOSKELETAL: No new bone pains. Has chronic arthritis.  SKIN: No new rashes or pruritus.  HEMATOLOGICALLY: Denies obvious bleeding disorders.  NEUROLOGIC: No new focal weakness, seizures or loss of consciousness.  ENDOCRINE: No polyuria or  polydipsia.   PHYSICAL EXAMINATION:  GENERAL: The patient is elderly, weak, on nasal cannula oxygen, resting in bed. Alert and oriented.  No icterus. VITAL SIGNS: currently afebrile, blood pressure 104/72, 99% percent on 2 liters.  HEENT: Normocephalic, atraumatic. Extraocular movements intact. Sclerae anicteric. No oral thrush.  NECK: Negative for lymphadenopathy.  CARDIOVASCULAR: S1S2, regular rate and rhythm.  LUNGS: Bilateral diminished breath sounds overall, mostly more so in the bases, no rhonchi.  ABDOMEN: Soft. No hepatomegaly or tenderness. Bowel sounds present.  EXTREMITIES: trace edema, no cyanosis.  SKIN: No generalized rashes or major bruising.  NEUROLOGIC: Limited exam. Cranial nerves intact. Moves all extremities spontaneously.   LABORATORY REPORTS: WBC 600,  ANC 100, hemoglobin 9.6, platelets 158,000. Creatinine  2.02. Calcium 6.6. LFTs unremarkable except albumin of 2.0. Lactic acid 1.1. Blood culture shows gram-negative rods, identification pending. Urinalysis shows 2+ blood.   IMPRESSION AND RECOMMENDATIONS: 81 year old gentleman with medical histroy as described above and has known history of chronic lymphocytic leukemia. Most recently completed cycle five chemotherapy with Rituxan and Treanda at the end of July 2015. He is now admitted with fevers, weakness, significant leukopenia/neutropenia (from recent chemotherapy) and pneumonia, hypotension, and has gram-negative rods growing on blood cultures. He has been started on Neupogen yesterday for significant symptomatic neutropenia. Clinically feels slightly better overall and remains afebrile today. Agree with ongoing broad spectrum antibiotic coverage. Wait for sensitivities of blood cucltre and adjust antibiotic accordingly. Also will continue Neupogen 480 mcg daily if ANC less than 1200. Patient has completed 5/6 cycles of chemotherapy, but have explained that it can be discussed after discharge if he wants to continue on  treatment or keep on surveillance. Otherwise  continue to monitor daily CBC and consider transfusion support as indicated by labs and symptoms. Patient and daughter-in-law present are agreeable to this plan.  Thank you for the referral. Please feel free to contact me if you have any questions. ____________________________ Rhett Bannister Ma Hillock, MD srp:am D: 05/11/2014 00:11:00 ET T: 05/11/2014 04:00:03 ET JOB#: [id number]  cc: Kaeleb Emond R. Ma Hillock, MD, <Dictator> Alveta Heimlich MD ELECTRONICALLY SIGNED 05/11/2014 10:35

## 2015-01-21 NOTE — Consult Note (Signed)
PATIENT NAME:  Harold Chavez, ROYSE MR#:  [id number] DATE OF BIRTH:  1924-06-25  DATE OF CONSULTATION:  06/14/2014  REFERRING PHYSICIAN:   CONSULTING PHYSICIAN:  Cheral Marker. Ola Spurr, MD  REQUESTING PHYSICIAN: Dr. Tressia Miners.    REASON FOR CONSULTATION:  Severe Clostridium difficile.    HISTORY OF PRESENT ILLNESS: This is a very pleasant 81 year old gentleman with history of CLL admitted September 9 with fevers and diarrhea. He had antibiotics the month prior for a  pneumonia for which he was admitted. He had a fever of 101.4 and had been having diarrhea and weakness for 2-3 days. In the ED the patient was found to have a leukocytosis of 48,000, to be tachycardic and hypotensive. He had not received any chemotherapy since the end of July. The patient was started on IV antibiotics on admission here, and also oral Flagyl. He was found to have Clostridium difficile.  Antibiotics were stopped except for the oral Flagyl. He however has continued to have a high white count and persistent diarrhea. He was also on IV Flagyl and p.o. vancomycin. Clinically he was improving until yesterday when the Flagyl was switched to p.o. Flagyl.  Of note he is tolerating some diet, is not having vomiting. He has not had any increase in abdominal distention. He has had no fevers and is somewhat improving clinically.   PAST MEDICAL HISTORY:  1. CLL undergoing chemotherapy at the cancer center.  2. Chronic kidney disease.  3. Anemia.  4. IgG gammopathy.    PAST SURGICAL HISTORY: None.   FAMILY HISTORY: Noncontributory.   ALLERGIES: No known drug allergies.   SOCIAL HISTORY: Negative for tobacco, alcohol or drugs.   ANTIBIOTICS SINCE ADMISSION: Include:  1. Metronidazole IV from September 11 through 13, metronidazole oral started September 14.  2. Oral vancomycin 250 q. 6 started September 9 and continued to currently.  3. Dificid begun September 15.   4. Zosyn begun September 8, but only one dose.  5. Vancomycin IV  given September 9, but only one dose.   PHYSICAL EXAMINATION:  VITAL SIGNS: Temperature 97.7, pulse 109, blood pressure 111/72, respirations 20, saturation 93% on room air.  GENERAL: He is thin, in no acute distress, just used the bedpan with loose stool.  EYES: Pupils are equal, round, and reactive to light and accommodation. Extraocular movements are intact. Sclerae anicteric.  OROPHARYNX: Clear, but mucous membranes are dry.  NECK: Supple.  HEART: Regular.  LUNGS: Clear.  ABDOMEN: Soft. Mild diffuse tender to palpation, no distention, hyperactive bowel sounds.  EXTREMITIES: No clubbing, cyanosis or edema.   DATA: White blood count on admission was 48, it is currently down to 22, hemoglobin 12.5, platelets 385,000. LFTs were normal on admission. Clostridium difficile was positive. Renal function shows a creatinine of 1.1, BUN of 14, down from 22/130 on admission, anion gap is 9.  Imaging, CT of the chest, abdomen, and pelvis done September 15 revealed moderate bilateral pleural effusions. There is diffuse colonic wall thickening with associated mesenteric edema and inflammation and a small amount of ascites consistent with diffuse infectious or inflammatory colitis, there is no obstruction, no extraluminal air, no defined abscess. No convincing pneumonia or pulmonary edema. There are some axillary lymph nodes and mild bronchiectasis and peribronchial interstitial thickening especially in the right middle lobe and left upper lobe.   IMPRESSION: An 81 year old with history of chronic lymphocytic leukemia with severe Clostridium difficile. His white blood count is improving, but his diarrhea persists. His last antibiotic was given when he  was admitted in August for pneumonia.   RECOMMENDATIONS:  1. Continue oral vancomycin but increased to 500 q. 6.   2. Continue IV Flagyl at this point.  3. It is okay to give him Dificid and we can follow along. If he worsens would consider also giving a  vancomycin enema.    Thank you for the consult. I will be glad to follow with you.    ____________________________ Cheral Marker. Ola Spurr, MD dpf:bu D: 06/14/2014 16:39:51 ET T: 06/14/2014 17:38:49 ET JOB#: [id number]  cc: Cheral Marker. Ola Spurr, MD, <Dictator> Dijon Kohlman Ola Spurr MD ELECTRONICALLY SIGNED 07/03/2014 23:53

## 2015-01-21 NOTE — H&P (Signed)
PATIENT NAME:  Harold Chavez, Harold Chavez MR#:  [id number] DATE OF BIRTH:  [birth year]  DATE OF ADMISSION:  06/08/2014  REFERRING PHYSICIAN: Dr. Harvest Dark.   PRIMARY CARE PHYSICIAN: Dr. Edd Fabian.   PRIMARY ONCOLOGY: Used to be Dr.Ramiyh.    HISTORY OF PRESENT ILLNESS: This is an 81 year old male with known history of CLL, anemia, chronic kidney disease, IgG gammopathy. The patient presents today with complaints of fever and generalized weakness. The patient reports he had fever today of 101.4 at home. His son is bedside and assists with the history. Reports he has been weak for the last 2 days, as well as complaining of diarrhea for the last 2 days as well, reports loose stools 3 to 4 bowel movements per day. In the ED, the patient was afebrile, he was slightly hypotensive which he improved with IV fluids as well, he was tachycardic and had significant leukocytosis at 48,000. The patient is known to have history of CLL on chemotherapy most recent, at end of July, which was hospitalization on August 14 due to septic shock from pneumonia where he was then leukopenic which he was given growth stimulating factor when he was discharged with normal white blood cell count. The patient did not have any significant leukocytosis in the past secondary to his CLL. The patient had blood cultures sent.  Urinalysis is still pending started on broad-spectrum IV antibiotics in ED, vancomycin and Zosyn, as well will start her on p.o. Flagyl empirically so that C. differential can be ruled out.  Hospitalist service was requested to admit the patient.   PAST MEDICAL HISTORY: CLL, anemia, CKD, IgG gammopathy.   PAST SURGICAL HISTORY: None.   FAMILY HISTORY:  No hypertension, diabetes or coronary artery disease in the family.   ALLERGIES: None.   HOME MEDICATIONS:  Aspirin 81 mg daily.   REVIEW OF SYSTEMS:  GENERAL: Reports feeling febrile and having some chills and feeling generally weak. Denies any weight gain or weight  loss.  EYES: Denies blurred vision, double vision, inflammation, glaucoma.  EARS, NOSE AND THROAT: Denies tinnitus, ear pain, hearing loss, epistaxis.  RESPIRATORY: Denies cough, wheezing, hemoptysis.  CARDIOVASCULAR: Denies chest pain, orthopnea, edema.  GASTROINTESTINAL: Denies nausea, vomiting, abdominal pain, hematemesis, melena. Reports diarrhea.  GENITOURINARY: Denies dysuria, hematuria, or renal colic.  ENDOCRINE: Denies polyuria, polydipsia, heat or cold intolerance.  HEMATOLOGY: Denies anemia, easy bruising, bleeding diathesis.  INTEGUMENTARY: Denies acne, rash, or skin lesion.  MUSCULOSKELETAL: Denies any swelling, gout, cramps.  NEUROLOGIC: Denies CVA, transient ischemic attack, tremors, vertigo, ataxia. PSYCHIATRIC: Denies anxiety, insomnia or bipolar disorder.   PHYSICAL EXAMINATION:  VITAL SIGNS: Temperature 98.9, pulse 118, respiratory rate 20, blood pressure 113/72, saturating 100% on oxygen. Upon presentation, blood pressure was 96/51, heart rate 130. GENERAL: Frail, elderly male who looks comfortable in bed, in no apparent distress.  HEENT: Head atraumatic, normocephalic. Pupils equal, reactive to light. Pink conjunctivae clear sclerae. No nasal discharge. Moist oral mucosa. No oral thrush or erythema.  NECK: Supple. No thyromegaly. No JVD. No carotid bruits. Trachea is midline.  CHEST: Good air entry bilaterally. No wheezing, rales, rhonchi or use of accessory muscle.  CARDIOVASCULAR: S1, S2 heard. No rubs, murmur or gallops. PMI not displaced, tachycardic but regular.  ABDOMEN: Soft, nontender, nondistended. Bowel sounds present. No rebound, no guarding.  EXTREMITIES: No edema. No clubbing. No cyanosis. Pedal pulses +2 bilaterally.  PSYCHIATRIC: Appropriate affect. Awake, alert x 3. Intact judgment and insight.  NEUROLOGIC: Cranial nerves grossly intact. Motor 5/5. No focal deficits.  MUSCULOSKELETAL: No joint effusion or erythema.  SKIN: Delayed skin turgor, dry.    PERTINENT LABORATORY DATA: Glucose 89, BUN 22, creatinine 1.3, sodium 138, potassium 3.7,   CO2 21. Troponin less than 0.02. White blood cells 48,000, hemoglobin 12.2, hematocrit 36.7, platelets 376,000.   IMAGING: Chest x-ray showing emphysematous changes and fibrosis of the lung. No evidence of active pulmonary disease.   ASSESSMENT AND PLAN:  1. Systemic inflammatory response syndrome. The patient presents with fever, tachycardia, leukocytosis, etiology so far is unclear, but given his diarrhea I do suspect he is having Clostridium difficile, so we will start him on p.o. vancomycin empirically pending his stool work-up. He received one dose of IV vancomycin and Zosyn which I will hold further IV antibiotic until the rest of his septic work-up and Clostridium difficile is ruled out, the patient is known to have CLL, but reviewed his old labs. He never had high leukocytosis and I do not think this is related to his CLL, especially with 92% neutrophils but we will consult oncology service for further assistance.  2. Anemia. Appears to be improving. We will continue to monitor.  3. Chronic lymphocytic leukemia. The patient has been followed as an outpatient. Will consult oncology service.  4. Deep vein thrombosis prophylaxis. Subcutaneous heparin.   CODE STATUS: The patient reports he has a LIVING WILL. His son is his healthcare power of attorney. He is a full code.   TOTAL TIME SPENT ON ADMISSION AND PATIENT CARE: 55 minutes.    ____________________________ Albertine Patricia, MD dse:JT D: 06/09/2014 00:14:49 ET T: 06/09/2014 01:11:16 ET JOB#: [id number]  cc: Albertine Patricia, MD, <Dictator> Ruffin Lada Graciela Husbands MD ELECTRONICALLY SIGNED 06/09/2014 23:43

## 2015-01-21 NOTE — Discharge Summary (Signed)
PATIENT NAME:  Harold Chavez, Harold Chavez MR#:  [id number] DATE OF BIRTH:  Jul 01, 1924  DATE OF ADMISSION:  05/09/2014 DATE OF DISCHARGE:  05/13/2014  PRIMARY CARE PHYSICIAN:  Dr. Danise Mina.  ADMITTING DIAGNOSES:  1.  Septic shock.  2.  Pneumonia.  3.  Acute renal failure on chronic kidney disease.  4.  Hyponatremia.  5.  Neutropenia.   PRIMARY DISCHARGE DIAGNOSES:   1.  Septic shock is resolved secondary to pneumonia.  2.  Pneumonia is improved.  Discharged to [facility] with p.o. levofloxacin  3.  Neutropenia, status post chemotherapy for chronic lymphocytic leukemia, status post Neupogen. White count is significantly improved.  4.  Acute renal insufficiency, resolved.  5.  Chronic angina.  6.  Septicemia with pseudomonas sensitive to levofloxacin.   SECONDARY DISCHARGE DIAGNOSES:  1.  Chronic kidney disease. The patient's renal function is back to baseline and creatinine is at 1.30.  2.  Chronic lymphocytic leukemia. Follow up with oncology as an outpatient as scheduled for continuation of chemotherapy.   CONSULTATIONS: Oncology.   PROCEDURES: None.   BRIEF HISTORY OF PRESENT ILLNESS AND HOSPITAL COURSE: The patient is an 81 year old male with a chronic history of chronic lymphocytic leukemia, IgG gammopathy, and chronic renal insufficiency. He was sent over to the ED from home due to generalized weakness for 2 days. The patient was diagnosed with chronic lymphocytic leukemia in February of this year and he is on chemotherapy. Prior to this admission, the patient then just finished his sixth cycle of chemotherapy 1 week before. At the time of admission, blood pressure was at 80s.  Please review history and physical for details. Chest x-ray showed right-sided pneumonia. The patient was initially admitted and treated with p.o. levofloxacin. Subsequently, as the patient was hypotensive and went into septic shock, he was placed on initial hydration with IV fluids and he was initially placed on a  Levophed and pressor. The patient was given IV Zosyn, vancomycin, and Zithromax. Blood cultures and sputum cultures were obtained. The patient was started on Neupogen for neutropenia. Oncology consult was placed.   During the hospital course with IV fluids and her broad-spectrum antibiotics, his blood pressure was significantly improved. Pressors were discontinued and patient was moved to step-down unit. With antibiotics, his condition significantly improved. The patient's broad-spectrum antibiotics were narrowed down to levofloxacin.   Neutropenia precautions were discontinued as the patient's white count is significantly improved with Neupogen. Neupogen discontinued. Hyponatremia is resolved with IV fluids.   Acute renal failure on chronic kidney disease and is resolved with IV fluids and creatinine is at 1.30.   However, the patient was hypoxemic and when we tried to wean him off the oxygen, his pulse oximetry was at 84% on room air. His pulse oximetry was up to 92% on 2 liters during ambulation. Case management was called and oxygen via nasal cannula was added.   The patient was evaluated by physical therapy as he was debilitated and complaining of weakness. As for the PT recommendations, the patient is discharge to rehabilitation center at [facility] with 2 liters of oxygen via nasal cannula. Overall condition is significantly improved.   CODE STATUS AT THE TIME OF DISCHARGE:  FULL CODE.   DIET: Regular.   ACTIVITY: As tolerated, as recommended by physical therapy.   FOLLOW UP: Follow up with primary care physician in 1 week and Dr. Ma Hillock in 1 week.   HOME MEDICATIONS: Aspirin 81 mg once daily, Colace 100 mg p.o. 2 times a day  as needed for constipation, levofloxacin 250 mg every other day, 3 doses, pantoprazole 40 mg p.o. once daily.   LABORATORIES AND IMAGING STUDIES: The patient's creatinine today is at 1.30, sodium  146, potassium 3.1, potassium supplements were given.  Calcium  is at 7.1 but albumin is at 2.0. WBC 9.5, hemoglobin 10.7, hematocrit 31.5, and platelets are at 249,000. Urinalysis is negative for nitrates and leukocyte esterase. At the time of admission, lactic acid is at 1.1. Blood cultures showed pseudomonas, which is pansensitive to all antibiotics.   Plan of care was discussed in detail with the patient and he verbalized understanding of the plan.    TOTAL TIME SPENT ON THE DISCHARGE: 45 minutes.    ____________________________ Nicholes Mango, MD ag:TT D: 05/13/2014 15:39:57 ET T: 05/13/2014 16:59:09 ET JOB#: [id number]  cc: Nicholes Mango, MD, <Dictator> Sandeep R. Ma Hillock, MD Nicholes Mango MD ELECTRONICALLY SIGNED 05/19/2014 15:51

## 2015-01-21 NOTE — Consult Note (Signed)
Pt seen and examined. Please see K. Earle's notes. Recent Abx use for pneumonia. Since there is no clinical improvement with po flagyl so far, will switch to IV flagyl and add po vancomycine. Hopefully, we will see clinical improvement soon. Will follow. Thanks.  Electronic Signatures: Verdie Shire (MD)  (Signed on 11-Sep-15 16:42)  Authored  Last Updated: 11-Sep-15 16:42 by Verdie Shire (MD)

## 2015-01-21 NOTE — Consult Note (Signed)
Chief Complaint:  Subjective/Chief Complaint Overall doing better. Diarrhea slowing down significantly. Appetite picking up.   VITAL SIGNS/ANCILLARY NOTES: **Vital Signs.:   14-Sep-15 05:00  Vital Signs Type Routine  Temperature Temperature (F) 98.2  Celsius 36.7  Temperature Source oral  Pulse Pulse 114  Respirations Respirations 20  Systolic BP Systolic BP 505  Diastolic BP (mmHg) Diastolic BP (mmHg) 75  Mean BP 87  Pulse Ox % Pulse Ox % 91  Pulse Ox Activity Level  At rest  Oxygen Delivery Room Air/ 21 %   Brief Assessment:  GEN no acute distress   Cardiac Regular   Respiratory clear BS   Gastrointestinal Normal   Lab Results: Routine Chem:  14-Sep-15 04:57   Glucose, Serum  103  BUN  19  Creatinine (comp) 1.13  Sodium, Serum 143  Potassium, Serum 3.8  Chloride, Serum  114  CO2, Serum  20  Calcium (Total), Serum  7.4  Anion Gap 9  Osmolality (calc) 287  eGFR (African American) >60  eGFR (Non-African American)  58 (eGFR values <52m/min/1.73 m2 may be an indication of chronic kidney disease (CKD). Calculated eGFR is useful in patients with stable renal function. The eGFR calculation will not be reliable in acutely ill patients when serum creatinine is changing rapidly. It is not useful in  patients on dialysis. The eGFR calculation may not be applicable to patients at the low and high extremes of body sizes, pregnant women, and vegetarians.)  Routine Hem:  14-Sep-15 04:57   WBC (CBC)  31.7 (Result(s) reported on 13 Jun 2014 at 0[facility])   Assessment/Plan:  Assessment/Plan:  Assessment CLL. C.diff. WBC coming down. Diarrhea resolving.   Plan Stable for discharge soon. Switch patient back to po flagyl. Continue vanco/flagyl x total of 10 day course. Add daily probiotics on discharge. Will sign off. F/U in GI in few weeks. Thanks.   Electronic Signatures: OVerdie Shire(MD)  (Signed 14-Sep-15 09:14)  Authored: Chief Complaint, VITAL SIGNS/ANCILLARY NOTES,  Brief Assessment, Lab Results, Assessment/Plan   Last Updated: 14-Sep-15 09:14 by OVerdie Shire(MD)

## 2015-01-21 NOTE — Consult Note (Signed)
Chief Complaint:  Subjective/Chief Complaint Overall feeling better. Less diarrhea. Appetite starting to come back. No bleeding.   VITAL SIGNS/ANCILLARY NOTES: **Vital Signs.:   12-Sep-15 04:53  Vital Signs Type Routine  Temperature Temperature (F) 98.5  Celsius 36.9  Temperature Source oral  Pulse Pulse 119  Respirations Respirations 20  Systolic BP Systolic BP 95  Diastolic BP (mmHg) Diastolic BP (mmHg) 56  Mean BP 69  Pulse Ox % Pulse Ox % 93  Pulse Ox Activity Level  At rest  Oxygen Delivery Room Air/ 21 %   Brief Assessment:  GEN no acute distress   Cardiac Regular   Respiratory clear BS   Gastrointestinal Normal   Lab Results: Routine Chem:  12-Sep-15 04:32   Glucose, Serum 92  BUN  25  Creatinine (comp)  1.31  Sodium, Serum 138  Potassium, Serum 3.8  Chloride, Serum  111  CO2, Serum  17  Calcium (Total), Serum  7.6  Anion Gap 10  Osmolality (calc) 280  eGFR (African American)  56  eGFR (Non-African American)  48 (eGFR values <73m/min/1.73 m2 may be an indication of chronic kidney disease (CKD). Calculated eGFR is useful in patients with stable renal function. The eGFR calculation will not be reliable in acutely ill patients when serum creatinine is changing rapidly. It is not useful in  patients on dialysis. The eGFR calculation may not be applicable to patients at the low and high extremes of body sizes, pregnant women, and vegetarians.)  Routine Hem:  12-Sep-15 04:32   WBC (CBC)  59.0 (Result(s) reported on 11 Jun 2014 at 05:05AM.)   Assessment/Plan:  Assessment/Plan:  Assessment C.diff. No response to po flagyl.   Plan Continue IV flagyl at least over the weekend. Continue po vanco. Advance diet as tolerated. I will be back on Monday. Contact GI on call if there are questions. Thanks.   Electronic Signatures: OVerdie Shire(MD)  (Signed 12-Sep-15 11:57)  Authored: Chief Complaint, VITAL SIGNS/ANCILLARY NOTES, Brief Assessment, Lab Results,  Assessment/Plan   Last Updated: 12-Sep-15 11:57 by OVerdie Shire(MD)

## 2015-01-21 NOTE — Discharge Summary (Signed)
PATIENT NAME:  Harold Chavez, Harold Chavez MR#:  [id number] DATE OF BIRTH:  06-03-1924  DATE OF ADMISSION:  06/08/2014 DATE OF DISCHARGE:  06/21/2014  HISTORY OF PRESENT ILLNESS: Please see interim discharge summary done on 06/17/2014 by Dr. Gladstone Lighter.  This is an addendum to earlier dictated interim discharge summary.    FINAL DISCHARGE DIAGNOSES:   1. Clostridium difficile colitis and sepsis.  2. Acute diastolic congestive heart failure.  3. Bilateral pleural effusion and atelectasis due to extended stay and IV fluid secondary to diarrhea.  4. Tachycardia.  5. Acute respiratory failure due to pleural effusion.   DISCHARGE MEDICATIONS:  1. Aspirin 81 mg once a day.  2. Acetaminophen 325 mg oral tablet 2 tablets every 4 hours as needed for pain.  3. Ondansetron 4 mg oral disintegrating tablet 3 times a day as needed for nausea and vomiting.  4. Cardizem 120 mg oral once a day.  5. Nystatin powder to affected area 2 times a day in the groin.  6. Furosemide 20 mg oral once a day for 3 days.  7. Vancomycin liquid 10 mL which is 500 mg every 6 hours for 6 days.  8. Lactobacilli acidophilus capsule 3 times a day for 10 days.  9. Ensure Plus 3 times a day.    HOME OXYGEN ON DISCHARGE: Yes, 2 liters.    DIET:  Regular with Ensure 3 times a day.   ACTIVITY: As tolerated.   FOLLOWUP:  With Dr. Ola Spurr 1-2 weeks. Also advised to follow renal function with primary care physician and to get echocardiogram with PMD's office.   HOSPITAL COURSE: AFTER SEPTEMBER 18:  As the patient was admitted with sepsis due to Clostridium difficile  diarrhea and colitis, initially had treatment failure with IV Flagyl and p.o. vancomycin, GI and ID consult was called in and there was thinking to go for stool transplant, but before that Dr. Vira Agar suggested to go for IVIG, which was given on September 18 and the patient had very good response after that in the next 2-3 days, so diarrhea was coming under control. The  patient will also started on Dificid which was also helping with Clostridium difficile diarrhea, so after the improvement as advised by Dr. Ola Spurr the patient was discharged home on oral vancomycin and to follow up with him in the office. Other medical issues, hypoxia, likely this was diastolic heart failure due to extended IV fluid use. The patient was given incentive spirometry, some Lasix with good response and was discharged on oxygen supplemental at home. Other medical issues like tachycardia, hypokalemia, chronic anemia, and chronic lymphocytic leukemia remained stable in this period of my care. Please see earlier the dictated interim discharge summary for further information.   IMPORTANT LABORATORY RESULTS: On September 19 WBC 13.5, hemoglobin is 12, platelet count is 406,000. Glucose 112, BUN 8, creatinine 1.24, sodium is 143, and potassium is 3.6. WBC 14.2, hemoglobin 11.6, and platelet count is 467,000 on next day. Chest x-ray PA and lateral on September 22 shows consolidation of bilateral lung base, associated bilateral effusion, slightly better compared to previous exam.   TOTAL TIME SPENT ON THIS DISCHARGE: 40 minutes.     ____________________________ Ceasar Lund Anselm Jungling, MD vgv:[id number] D: 06/23/2014 14:21:32 ET T: 06/23/2014 14:35:15 ET JOB#: [id number]  cc: Ceasar Lund. Anselm Jungling, MD, <Dictator> Vaughan Basta MD ELECTRONICALLY SIGNED 07/02/2014 12:48

## 2015-02-03 ENCOUNTER — Ambulatory Visit (INDEPENDENT_AMBULATORY_CARE_PROVIDER_SITE_OTHER): Payer: Medicare Other

## 2015-02-03 DIAGNOSIS — E538 Deficiency of other specified B group vitamins: Secondary | ICD-10-CM | POA: Diagnosis not present

## 2015-02-03 MED ORDER — CYANOCOBALAMIN 1000 MCG/ML IJ SOLN
1000.0000 ug | Freq: Once | INTRAMUSCULAR | Status: AC
  Administered 2015-02-03: 1000 ug via INTRAMUSCULAR

## 2015-02-10 ENCOUNTER — Other Ambulatory Visit: Payer: Self-pay

## 2015-02-10 DIAGNOSIS — C8300 Small cell B-cell lymphoma, unspecified site: Secondary | ICD-10-CM

## 2015-02-16 ENCOUNTER — Inpatient Hospital Stay: Payer: Medicare Other | Attending: Hematology and Oncology

## 2015-02-16 ENCOUNTER — Inpatient Hospital Stay: Payer: Medicare Other | Attending: Hematology and Oncology | Admitting: Hematology and Oncology

## 2015-02-16 ENCOUNTER — Encounter (INDEPENDENT_AMBULATORY_CARE_PROVIDER_SITE_OTHER): Payer: Self-pay

## 2015-02-16 VITALS — BP 121/73 | HR 88 | Temp 97.8°F | Resp 18 | Ht 72.0 in | Wt 154.9 lb

## 2015-02-16 DIAGNOSIS — Z79899 Other long term (current) drug therapy: Secondary | ICD-10-CM | POA: Diagnosis not present

## 2015-02-16 DIAGNOSIS — Z9221 Personal history of antineoplastic chemotherapy: Secondary | ICD-10-CM | POA: Insufficient documentation

## 2015-02-16 DIAGNOSIS — D519 Vitamin B12 deficiency anemia, unspecified: Secondary | ICD-10-CM | POA: Insufficient documentation

## 2015-02-16 DIAGNOSIS — C9110 Chronic lymphocytic leukemia of B-cell type not having achieved remission: Secondary | ICD-10-CM | POA: Diagnosis not present

## 2015-02-16 DIAGNOSIS — D869 Sarcoidosis, unspecified: Secondary | ICD-10-CM | POA: Insufficient documentation

## 2015-02-16 DIAGNOSIS — Z7982 Long term (current) use of aspirin: Secondary | ICD-10-CM | POA: Insufficient documentation

## 2015-02-16 DIAGNOSIS — N189 Chronic kidney disease, unspecified: Secondary | ICD-10-CM | POA: Insufficient documentation

## 2015-02-16 DIAGNOSIS — C8300 Small cell B-cell lymphoma, unspecified site: Secondary | ICD-10-CM

## 2015-02-16 LAB — CBC WITH DIFFERENTIAL/PLATELET
Basophils Absolute: 0 10*3/uL (ref 0–0.1)
Basophils Relative: 0 %
Eosinophils Absolute: 0.2 10*3/uL (ref 0–0.7)
Eosinophils Relative: 2 %
HCT: 41.8 % (ref 40.0–52.0)
Hemoglobin: 13.9 g/dL (ref 13.0–18.0)
Lymphocytes Relative: 20 %
Lymphs Abs: 1.5 10*3/uL (ref 1.0–3.6)
MCH: 31.4 pg (ref 26.0–34.0)
MCHC: 33.3 g/dL (ref 32.0–36.0)
MCV: 94.1 fL (ref 80.0–100.0)
Monocytes Absolute: 0.6 10*3/uL (ref 0.2–1.0)
Monocytes Relative: 8 %
Neutro Abs: 5.2 10*3/uL (ref 1.4–6.5)
Neutrophils Relative %: 70 %
Platelets: 264 10*3/uL (ref 150–440)
RBC: 4.44 MIL/uL (ref 4.40–5.90)
RDW: 13.9 % (ref 11.5–14.5)
WBC: 7.6 10*3/uL (ref 3.8–10.6)

## 2015-02-16 LAB — BASIC METABOLIC PANEL
Anion gap: 5 (ref 5–15)
BUN: 29 mg/dL — ABNORMAL HIGH (ref 6–20)
CO2: 28 mmol/L (ref 22–32)
Calcium: 9 mg/dL (ref 8.9–10.3)
Chloride: 105 mmol/L (ref 101–111)
Creatinine, Ser: 1.35 mg/dL — ABNORMAL HIGH (ref 0.61–1.24)
GFR calc Af Amer: 52 mL/min — ABNORMAL LOW (ref >60)
GFR calc non Af Amer: 45 mL/min — ABNORMAL LOW (ref >60)
Glucose, Bld: 93 mg/dL (ref 65–99)
Potassium: 4.3 mmol/L (ref 3.5–5.1)
Sodium: 138 mmol/L (ref 135–145)

## 2015-02-16 LAB — LACTATE DEHYDROGENASE: LDH: 120 U/L (ref 98–192)

## 2015-02-16 LAB — URIC ACID: Uric Acid, Serum: 6.8 mg/dL (ref 4.4–7.6)

## 2015-03-07 ENCOUNTER — Ambulatory Visit (INDEPENDENT_AMBULATORY_CARE_PROVIDER_SITE_OTHER): Payer: Medicare Other

## 2015-03-07 DIAGNOSIS — E538 Deficiency of other specified B group vitamins: Secondary | ICD-10-CM

## 2015-03-07 MED ORDER — CYANOCOBALAMIN 1000 MCG/ML IJ SOLN
1000.0000 ug | Freq: Once | INTRAMUSCULAR | Status: AC
  Administered 2015-03-07: 1000 ug via INTRAMUSCULAR

## 2015-04-11 ENCOUNTER — Ambulatory Visit: Payer: Medicare Other

## 2015-04-14 ENCOUNTER — Encounter: Payer: Self-pay | Admitting: Family Medicine

## 2015-04-14 ENCOUNTER — Ambulatory Visit (INDEPENDENT_AMBULATORY_CARE_PROVIDER_SITE_OTHER): Payer: Medicare Other | Admitting: Family Medicine

## 2015-04-14 VITALS — BP 118/74 | HR 88 | Temp 97.9°F | Wt 149.2 lb

## 2015-04-14 DIAGNOSIS — R000 Tachycardia, unspecified: Secondary | ICD-10-CM

## 2015-04-14 DIAGNOSIS — C8300 Small cell B-cell lymphoma, unspecified site: Secondary | ICD-10-CM | POA: Diagnosis not present

## 2015-04-14 DIAGNOSIS — D531 Other megaloblastic anemias, not elsewhere classified: Secondary | ICD-10-CM

## 2015-04-14 DIAGNOSIS — E441 Mild protein-calorie malnutrition: Secondary | ICD-10-CM

## 2015-04-14 DIAGNOSIS — I10 Essential (primary) hypertension: Secondary | ICD-10-CM | POA: Insufficient documentation

## 2015-04-14 DIAGNOSIS — N183 Chronic kidney disease, stage 3 unspecified: Secondary | ICD-10-CM

## 2015-04-14 MED ORDER — CYANOCOBALAMIN 1000 MCG/ML IJ SOLN
1000.0000 ug | Freq: Once | INTRAMUSCULAR | Status: AC
  Administered 2015-04-14: 1000 ug via INTRAMUSCULAR

## 2015-04-14 MED ORDER — DILTIAZEM HCL 60 MG PO TABS: 60.0000 mg | ORAL_TABLET | ORAL | Status: DC

## 2015-04-14 NOTE — Assessment & Plan Note (Signed)
Unfortunately weight loss noted, but pt endorses good appetite.

## 2015-04-14 NOTE — Addendum Note (Signed)
Addended by: Marchia Bond on: 04/14/2015 01:40 PM   Modules accepted: Orders

## 2015-04-14 NOTE — Assessment & Plan Note (Signed)
Resolved as of last check. Continue monthly B12 shots.

## 2015-04-14 NOTE — Patient Instructions (Addendum)
B12 shot today. Let's try cardizem every other day - watch for speeding heart beat and let me know if that happens. Return in 6 months for medicare wellness visit

## 2015-04-14 NOTE — Assessment & Plan Note (Signed)
On cardizem. bp stable. rec change to QOD dosing, monitor for recurrent tachycardia. If stable, consider taper off CCB.

## 2015-04-14 NOTE — Progress Notes (Signed)
BP 118/74 mmHg  Pulse 88  Temp(Src) 97.9 F (36.6 C) (Oral)  Wt 149 lb 4 oz (67.699 kg)   CC: f/u visit   Subjective:    Patient ID: Harold Chavez, male    DOB: 03/02/1924, 81 y.o.   MRN: [id number]  HPI: Harold Chavez is a 81 y.o. male presenting on 04/14/2015 for Follow-up   Recent dx small lymphocytic lymphoma followed by [facility] onc. Completed chemotherapy treatments and did very well. Dr Mike Gip, doing well.  Overall doing well. No concerns today. Requests B12 shot today.  Relevant past medical, surgical, family and social history reviewed and updated as indicated. Interim medical history since our last visit reviewed. Allergies and medications reviewed and updated. Current Outpatient Prescriptions on File Prior to Visit  Medication Sig  . aspirin 81 MG tablet Take 81 mg by mouth daily.  . cyanocobalamin (,VITAMIN B-12,) 1000 MCG/ML injection Inject 1 mL (1,000 mcg total) into the muscle every 30 (thirty) days. Start 10/2013  . ENSURE PLUS (ENSURE PLUS) LIQD Take 237 mLs by mouth.  . Probiotic Product (PROBIOTIC DAILY PO) Take by mouth daily.  Marland Kitchen ipratropium (ATROVENT) 0.02 % nebulizer solution Take 0.5 mg by nebulization 4 (four) times daily.  Marland Kitchen ketoconazole (NIZORAL) 2 % cream Apply 1 application topically 2 (two) times daily.  Marland Kitchen nystatin (MYCOSTATIN/NYSTOP) 100000 UNIT/GM POWD Apply topically 2 (two) times daily.   No current facility-administered medications on file prior to visit.    Review of Systems Per HPI unless specifically indicated above     Objective:    BP 118/74 mmHg  Pulse 88  Temp(Src) 97.9 F (36.6 C) (Oral)  Wt 149 lb 4 oz (67.699 kg)  Wt Readings from Last 3 Encounters:  04/14/15 149 lb 4 oz (67.699 kg)  02/16/15 154 lb 14 oz (70.25 kg)  11/17/14 153 lb 14.1 oz (69.8 kg)    Physical Exam  Constitutional: He appears well-developed and well-nourished. No distress.  HENT:  Mouth/Throat: Oropharynx is clear and moist. No oropharyngeal exudate.    Cardiovascular: Normal rate, regular rhythm, normal heart sounds and intact distal pulses.   No murmur heard. Pulmonary/Chest: Effort normal and breath sounds normal. No respiratory distress. He has no wheezes. He has no rales.  Musculoskeletal: He exhibits no edema.  R elbow lipoma  Lymphadenopathy:       Head (right side): No submental, no submandibular, no tonsillar, no preauricular and no posterior auricular adenopathy present.       Head (left side): No submental, no submandibular, no tonsillar, no preauricular and no posterior auricular adenopathy present.    He has no axillary adenopathy.       Right axillary: No lateral adenopathy present.       Left axillary: No lateral adenopathy present.      Right: No supraclavicular adenopathy present.       Left: No supraclavicular adenopathy present.  Skin: Skin is warm and dry. No rash noted.  Psychiatric: He has a normal mood and affect.  Nursing note and vitals reviewed.  Results for orders placed or performed in visit on 02/16/15  CBC with Differential  Result Value Ref Range   WBC 7.6 3.8 - 10.6 K/uL   RBC 4.44 4.40 - 5.90 MIL/uL   Hemoglobin 13.9 13.0 - 18.0 g/dL   HCT 41.8 40.0 - 52.0 %   MCV 94.1 80.0 - 100.0 fL   MCH 31.4 26.0 - 34.0 pg   MCHC 33.3 32.0 - 36.0 g/dL  RDW 13.9 11.5 - 14.5 %   Platelets 264 150 - 440 K/uL   Neutrophils Relative % 70 %   Neutro Abs 5.2 1.4 - 6.5 K/uL   Lymphocytes Relative 20 %   Lymphs Abs 1.5 1.0 - 3.6 K/uL   Monocytes Relative 8 %   Monocytes Absolute 0.6 0.2 - 1.0 K/uL   Eosinophils Relative 2 %   Eosinophils Absolute 0.2 0 - 0.7 K/uL   Basophils Relative 0 %   Basophils Absolute 0.0 0 - 0.1 K/uL  Basic metabolic panel  Result Value Ref Range   Sodium 138 135 - 145 mmol/L   Potassium 4.3 3.5 - 5.1 mmol/L   Chloride 105 101 - 111 mmol/L   CO2 28 22 - 32 mmol/L   Glucose, Bld 93 65 - 99 mg/dL   BUN 29 (H) 6 - 20 mg/dL   Creatinine, Ser 1.35 (H) 0.61 - 1.24 mg/dL   Calcium 9.0  8.9 - 10.3 mg/dL   GFR calc non Af Amer 45 (L) >60 mL/min   GFR calc Af Amer 52 (L) >60 mL/min   Anion gap 5 5 - 15  Uric acid  Result Value Ref Range   Uric Acid, Serum 6.8 4.4 - 7.6 mg/dL  Lactate Dehydrogenase  Result Value Ref Range   LDH 120 98 - 192 U/L      Assessment & Plan:   Problem List Items Addressed This Visit    Chronic kidney disease    Cr continues to improve.      Lymphoma, small lymphocytic    Stable, Q6 mo onc checks.      Megaloblastic anemia due to B12 deficiency - Primary    Resolved as of last check. Continue monthly B12 shots.      Protein-calorie malnutrition, mild    Unfortunately weight loss noted, but pt endorses good appetite.      Tachycardia    On cardizem. bp stable. rec change to QOD dosing, monitor for recurrent tachycardia. If stable, consider taper off CCB.          Follow up plan: Return in about 6 months (around 10/15/2015), or as needed, for medicare wellness.

## 2015-04-14 NOTE — Progress Notes (Signed)
Pre visit review using our clinic review tool, if applicable. No additional management support is needed unless otherwise documented below in the visit note. 

## 2015-04-14 NOTE — Assessment & Plan Note (Signed)
Stable, Q6 mo onc checks.

## 2015-04-14 NOTE — Assessment & Plan Note (Signed)
Cr continues to improve.

## 2015-05-18 ENCOUNTER — Ambulatory Visit (INDEPENDENT_AMBULATORY_CARE_PROVIDER_SITE_OTHER): Payer: Medicare Other

## 2015-05-18 DIAGNOSIS — E538 Deficiency of other specified B group vitamins: Secondary | ICD-10-CM

## 2015-05-18 MED ORDER — CYANOCOBALAMIN 1000 MCG/ML IJ SOLN
1000.0000 ug | Freq: Once | INTRAMUSCULAR | Status: AC
Start: 2015-05-18 — End: 2015-05-18
  Administered 2015-05-18: 1000 ug via INTRAMUSCULAR

## 2015-06-11 ENCOUNTER — Other Ambulatory Visit: Payer: Self-pay | Admitting: Family Medicine

## 2015-06-22 ENCOUNTER — Ambulatory Visit (INDEPENDENT_AMBULATORY_CARE_PROVIDER_SITE_OTHER): Payer: Medicare Other

## 2015-06-22 DIAGNOSIS — E538 Deficiency of other specified B group vitamins: Secondary | ICD-10-CM

## 2015-06-22 MED ORDER — CYANOCOBALAMIN 1000 MCG/ML IJ SOLN
1000.0000 ug | Freq: Once | INTRAMUSCULAR | Status: AC
  Administered 2015-06-22: 1000 ug via INTRAMUSCULAR

## 2015-07-02 IMAGING — CR METASTATIC BONE SURVEY
1 series · 9 of 10 positions shown · non-contrast
Comparison: CT ABDOMEN W/O CM dated 01/20/2007; DG CHEST 2 VIEW
dated 11/06/2013

CLINICAL DATA: Multiple myeloma

EXAM:
METASTATIC BONE SURVEY

[Series 1: w cervical spine lat · 0.14mm/px · 9 of 17 slices shown]
[im 1/17]
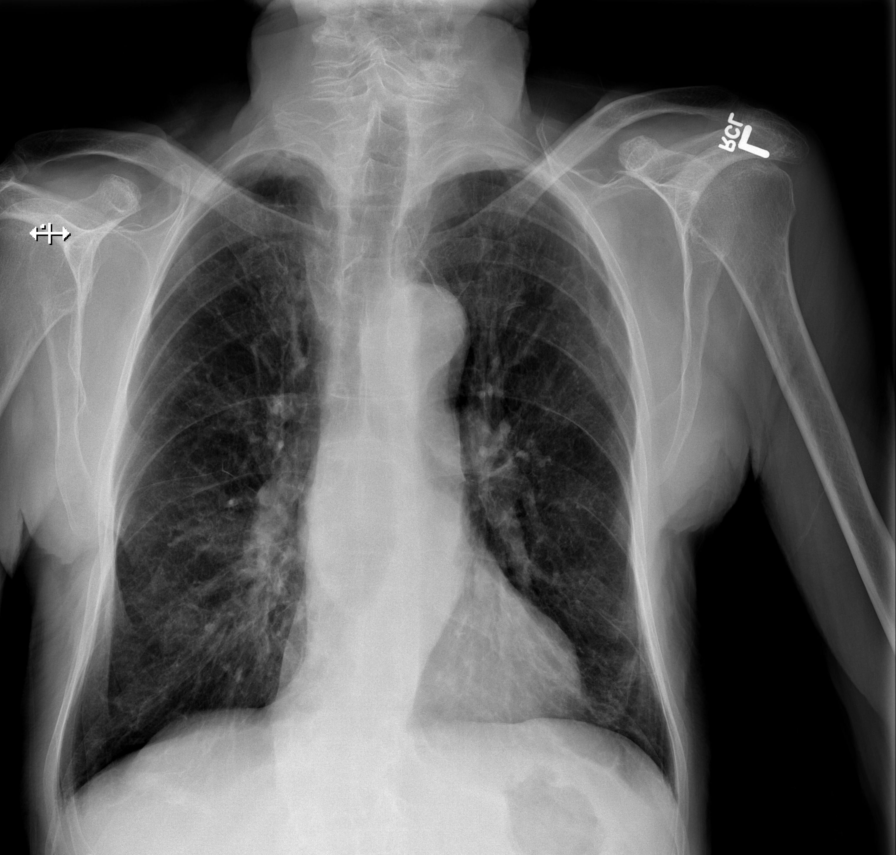
[im 2/17]
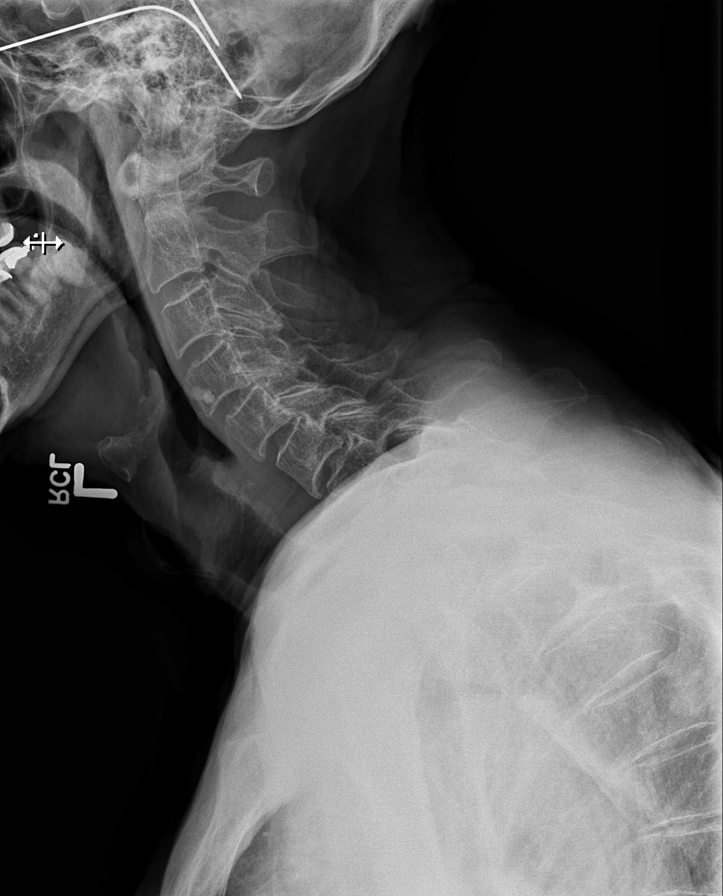
[im 4/17]
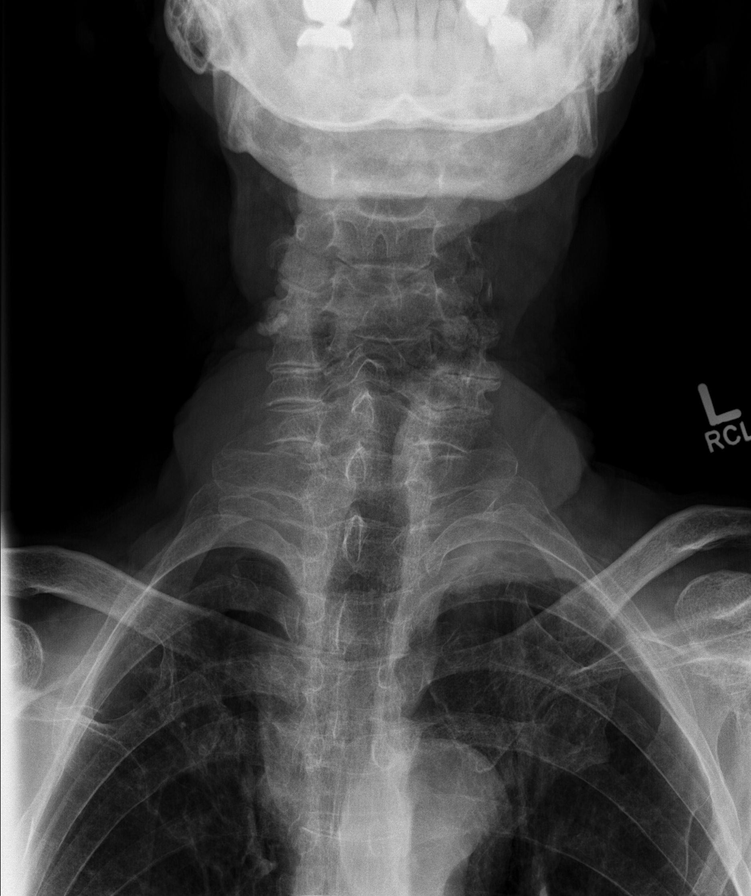
[im 6/17]
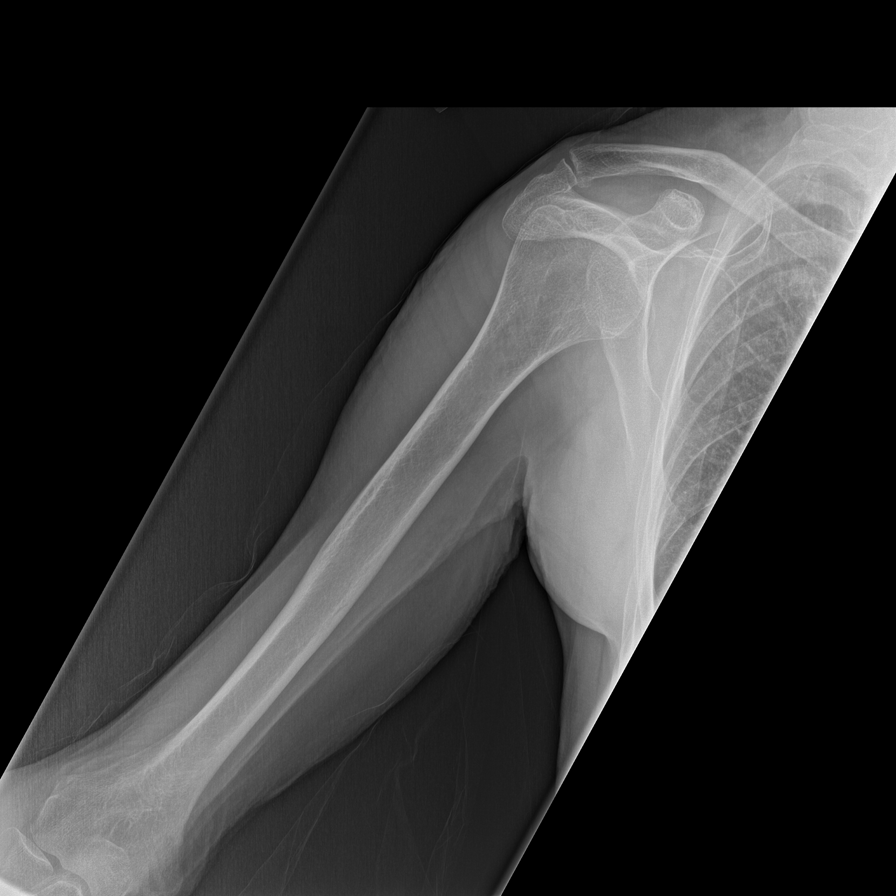
[im 8/17]
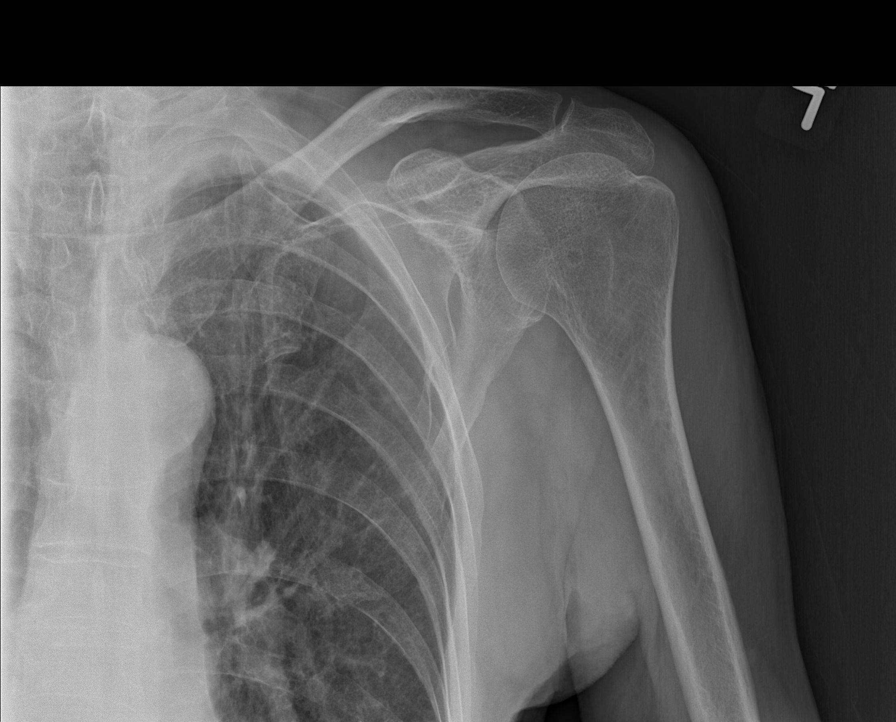
[im 9/17]
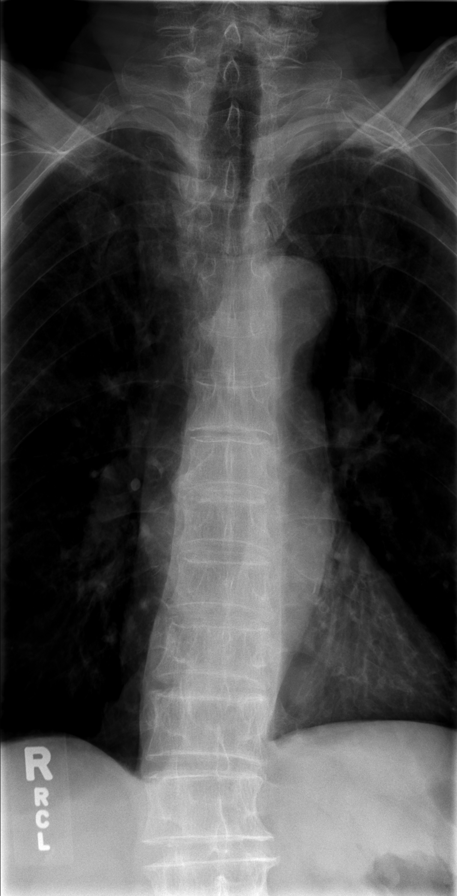
[im 11/17]
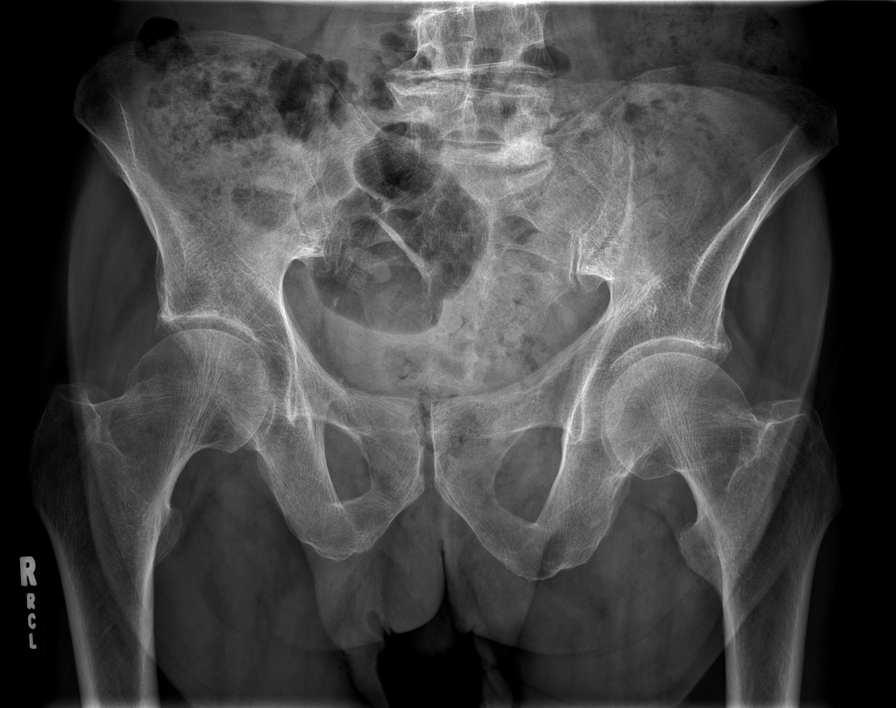
[im 13/17]
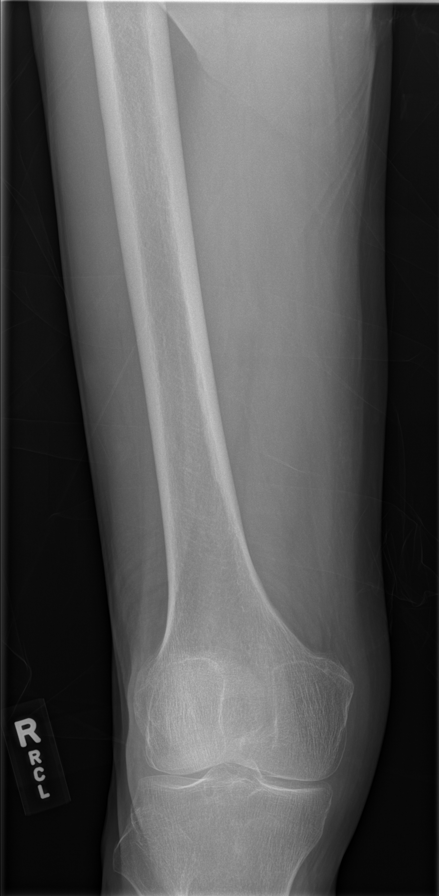
[im 15/17]
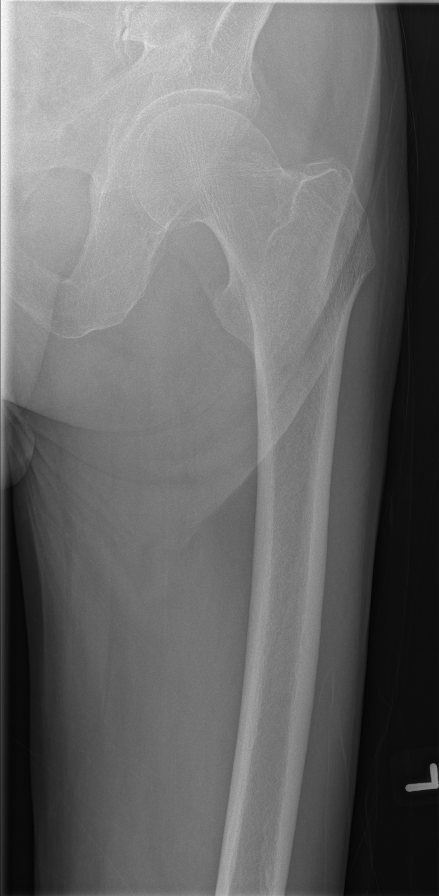

[9 of 10 positions shown; findings below may reference images not displayed]

FINDINGS: Grossly unchanged cardiac silhouette and mediastinal contours with
mild tortuosity and possible slight ectasia of the thoracic aorta.
There is unchanged nodular prominence of the bilateral pulmonary
hila. The lungs appear hyperexpanded with flattening of island the
diaphragms. No discrete focal airspace opacities. No definite
pleural effusion or pneumothorax. No definite evidence of edema.

No discrete lytic lesions are seen within the calvarium.

No discrete lytic lesions are seen within either humerus. Mild
degenerate changes seen within the bilateral acromioclavicular
joints.

There is a mild scoliotic curvature of the thoracolumbar spine with
inferior curvature convex to the left. Accentuated thoracic
kyphosis. There is mild (3-4 mm) of anterolisthesis of C5 upon C6
and C6 upon C7.

Vertebral body heights are grossly preserved.

Mild to moderate multilevel lumbar spine DDD, likely worse at L4-L5
and L5-S1 with disc space height loss, endplate irregularity and
sclerosis.

Mild to moderate DDD of the lower cervical spine, likely worse at
C5-C6 and C6-C7. Calcifications overlie the expected location of the
right carotid bulb.

No discrete lytic lesions are seen within the pelvis or either
femur.
IMPRESSION: No definite evidence of myeloma.

## 2015-07-27 ENCOUNTER — Ambulatory Visit (INDEPENDENT_AMBULATORY_CARE_PROVIDER_SITE_OTHER): Payer: Medicare Other | Admitting: *Deleted

## 2015-07-27 DIAGNOSIS — E538 Deficiency of other specified B group vitamins: Secondary | ICD-10-CM | POA: Diagnosis not present

## 2015-07-27 MED ORDER — CYANOCOBALAMIN 1000 MCG/ML IJ SOLN
1000.0000 ug | Freq: Once | INTRAMUSCULAR | Status: AC
  Administered 2015-07-27: 1000 ug via INTRAMUSCULAR

## 2015-08-20 ENCOUNTER — Encounter: Payer: Self-pay | Admitting: Hematology and Oncology

## 2015-08-20 NOTE — Progress Notes (Signed)
[facility] day:  02/16/2015  Chief Complaint: Harold Chavez is a 81 y.o. male with CLL who is seen for 3 month assessment.  HPI: The patient was last seen in the medical oncology clinic on 11/17/2014.  At that time, he was doing well. He denies any B symptoms. He had no further infections. Exam revealed no adenopathy or hepatosplenomegaly. Labs included a hematocrit of 42, hemoglobin 13.8, platelets 266,000, and white count 8600 with an ANC of 6000. Absolute lymphocyte count was 1600.  During the interim, he notes he has felt pretty good. He denies any fevers, sweats, or weight loss. He denies any concerns.  Past Medical History  Diagnosis Date  . Benign paroxysmal positional vertigo   . History of shingles 2010  . Sarcoidosis   . Lentigo maligna of right cheek 2015    s/p Mohs ([place])  . B12 deficiency anemia   . Lymphoma, small lymphocytic 2015    (onc at [facility])  . Leukemia     CLL  . Chronic kidney disease   . IgG gammopathy     Past Surgical History  Procedure Laterality Date  . Testicle removal  1937    right; undescended  . Mohs surgery  2015    R cheek lentigo maligna in situ    Family History  Problem Relation Age of Onset  . Stroke Father     Social History:  reports that he has never smoked. He has never used smokeless tobacco. He reports that he does not drink alcohol or use illicit drugs.  The patient is alone today.  Allergies: No Known Allergies  Current Medications: Current Outpatient Prescriptions  Medication Sig Dispense Refill  . aspirin 81 MG tablet Take 81 mg by mouth daily.    . cyanocobalamin (,VITAMIN B-12,) 1000 MCG/ML injection Inject 1 mL (1,000 mcg total) into the muscle every 30 (thirty) days. Start 10/2013    . ENSURE PLUS (ENSURE PLUS) LIQD Take 237 mLs by mouth.    . Probiotic Product (PROBIOTIC DAILY PO) Take by mouth daily.    Marland Kitchen diltiazem (CARDIZEM) 60 MG tablet Take 1 tablet (60 mg  total) by mouth every other day. 90 tablet 1  . ipratropium (ATROVENT) 0.02 % nebulizer solution Take 0.5 mg by nebulization 4 (four) times daily.    Marland Kitchen ketoconazole (NIZORAL) 2 % cream Apply 1 application topically 2 (two) times daily.    Marland Kitchen nystatin (MYCOSTATIN/NYSTOP) 100000 UNIT/GM POWD Apply topically 2 (two) times daily.     No current facility-administered medications for this visit.    Review of Systems:  GENERAL:  Feels good.  Active.  No fevers, sweats or weight loss. PERFORMANCE STATUS (ECOG):  1 HEENT:  No visual changes, runny nose, sore throat, mouth sores or tenderness. Lungs: No shortness of breath or cough.  No hemoptysis. Cardiac:  No chest pain, palpitations, orthopnea, or PND. GI:  No nausea, vomiting, diarrhea, constipation, melena or hematochezia. GU:  No urgency, frequency, dysuria, or hematuria. Musculoskeletal:  Watching right side of back.  No joint pain.  No muscle tenderness. Extremities:  No pain or swelling. Skin:  No rashes or skin changes. Neuro:  No headache, numbness or weakness, balance or coordination issues. Endocrine:  No diabetes, thyroid issues, hot flashes or night sweats. Psych:  No mood changes, depression or anxiety. Pain:  No focal pain. Review of systems:  All other systems reviewed and found to be negative.  Physical Exam: Blood pressure  121/73, pulse 88, temperature 97.8 F (36.6 C), temperature source Oral, resp. rate 18, height 6' (1.829 m), weight 154 lb 14 oz (70.25 kg). GENERAL:  Well developed, well nourished, sitting comfortably in the exam room in no acute distress. MENTAL STATUS:  Alert and oriented to person, place and time. HEAD:  Short gray hair.  Normocephalic, atraumatic, face symmetric, no Cushingoid features. EYES:  Blue eyes.  Pupils equal round and reactive to light and accomodation.  No conjunctivitis or scleral icterus. ENT:  Oropharynx clear without lesion.  Tongue normal. Mucous membranes moist.  RESPIRATORY:  Clear  to auscultation without rales, wheezes or rhonchi. CARDIOVASCULAR:  Regular rate and rhythm without murmur, rub or gallop. ABDOMEN:  Soft, non-tender, with active bowel sounds, and no hepatosplenomegaly.  No masses. SKIN:  No rashes, ulcers or lesions. EXTREMITIES: No edema, no skin discoloration or tenderness.  No palpable cords. LYMPH NODES: No palpable cervical, supraclavicular, axillary or inguinal adenopathy  NEUROLOGICAL: Unremarkable. PSYCH:  Appropriate.  Appointment on 02/16/2015  Component Date Value Ref Range Status  . WBC 02/16/2015 7.6  3.8 - 10.6 K/uL Final  . RBC 02/16/2015 4.44  4.40 - 5.90 MIL/uL Final  . Hemoglobin 02/16/2015 13.9  13.0 - 18.0 g/dL Final  . HCT 02/16/2015 41.8  40.0 - 52.0 % Final  . MCV 02/16/2015 94.1  80.0 - 100.0 fL Final  . MCH 02/16/2015 31.4  26.0 - 34.0 pg Final  . MCHC 02/16/2015 33.3  32.0 - 36.0 g/dL Final  . RDW 02/16/2015 13.9  11.5 - 14.5 % Final  . Platelets 02/16/2015 264  150 - 440 K/uL Final  . Neutrophils Relative % 02/16/2015 70   Final  . Neutro Abs 02/16/2015 5.2  1.4 - 6.5 K/uL Final  . Lymphocytes Relative 02/16/2015 20   Final  . Lymphs Abs 02/16/2015 1.5  1.0 - 3.6 K/uL Final  . Monocytes Relative 02/16/2015 8   Final  . Monocytes Absolute 02/16/2015 0.6  0.2 - 1.0 K/uL Final  . Eosinophils Relative 02/16/2015 2   Final  . Eosinophils Absolute 02/16/2015 0.2  0 - 0.7 K/uL Final  . Basophils Relative 02/16/2015 0   Final  . Basophils Absolute 02/16/2015 0.0  0 - 0.1 K/uL Final  . Sodium 02/16/2015 138  135 - 145 mmol/L Final  . Potassium 02/16/2015 4.3  3.5 - 5.1 mmol/L Final  . Chloride 02/16/2015 105  101 - 111 mmol/L Final  . CO2 02/16/2015 28  22 - 32 mmol/L Final  . Glucose, Bld 02/16/2015 93  65 - 99 mg/dL Final  . BUN 02/16/2015 29* 6 - 20 mg/dL Final  . Creatinine, Ser 02/16/2015 1.35* 0.61 - 1.24 mg/dL Final  . Calcium 02/16/2015 9.0  8.9 - 10.3 mg/dL Final  . GFR calc non Af Amer 02/16/2015 45* >60 mL/min  Final  . GFR calc Af Amer 02/16/2015 52* >60 mL/min Final   Comment: (NOTE) The eGFR has been calculated using the CKD EPI equation. This calculation has not been validated in all clinical situations. eGFR's persistently <60 mL/min signify possible Chronic Kidney Disease.   . Anion gap 02/16/2015 5  5 - 15 Final  . Uric Acid, Serum 02/16/2015 6.8  4.4 - 7.6 mg/dL Final  . LDH 02/16/2015 120  98 - 192 U/L Final    Assessment:  SHLOK RAZ is a 81 y.o. male with chronic lymphocytic leukemia (CLL).  He presented in 10/2013 with dehydration and syncope.  Evaluation revealed axillary adenopathy.  He  received 5 of 6 cycles of planned bendamustine and Rituxan (12/17/2013 - 04/27/2014).  With cycle #5 he developed neutropenia and received GCSF.  He was subsequently admitted with sepsis and pneumonia.  Course was complicated by C diff.  Decision was made to defer his last cycle of chemotherapy.  He has a monoclonal increase of IgG of unclear significance.  IgG was 1101 on 08/19/2014.  Review of prior labs noted no M-spike, but kappa light chains on 12/01/2013.  At that time, kappa free light chains were 677.68 (high), lambda free light chains 453.8 (high), but a normal ratio (1.49).  Kappa free light chains were 34.64 (high), lambda free light chains 22.11 (high), with a normal ratio (1.57) on 11/17/2014.    Symptomatically, he denies any B symptoms.  Exam reveals no adenopathy or hepatosplenomegaly.  Plan: 1. Labs today: CBC with diff, BMP, LDH, uric acid.  2. RTC in 6 months for MD assessment and labs (CBC, BMP, LDH, uric acid).   Lequita Asal, MD  02/16/2015

## 2015-08-21 ENCOUNTER — Inpatient Hospital Stay (HOSPITAL_BASED_OUTPATIENT_CLINIC_OR_DEPARTMENT_OTHER): Payer: Medicare Other | Admitting: Hematology and Oncology

## 2015-08-21 ENCOUNTER — Inpatient Hospital Stay: Payer: Medicare Other | Attending: Hematology and Oncology

## 2015-08-21 VITALS — BP 127/76 | HR 80 | Temp 97.0°F | Resp 18 | Ht 72.0 in | Wt 153.0 lb

## 2015-08-21 DIAGNOSIS — C9110 Chronic lymphocytic leukemia of B-cell type not having achieved remission: Secondary | ICD-10-CM | POA: Insufficient documentation

## 2015-08-21 DIAGNOSIS — D519 Vitamin B12 deficiency anemia, unspecified: Secondary | ICD-10-CM | POA: Diagnosis not present

## 2015-08-21 DIAGNOSIS — Z7982 Long term (current) use of aspirin: Secondary | ICD-10-CM | POA: Diagnosis not present

## 2015-08-21 DIAGNOSIS — Z79899 Other long term (current) drug therapy: Secondary | ICD-10-CM | POA: Diagnosis not present

## 2015-08-21 DIAGNOSIS — Z9221 Personal history of antineoplastic chemotherapy: Secondary | ICD-10-CM | POA: Insufficient documentation

## 2015-08-21 DIAGNOSIS — D472 Monoclonal gammopathy: Secondary | ICD-10-CM

## 2015-08-21 DIAGNOSIS — C8300 Small cell B-cell lymphoma, unspecified site: Secondary | ICD-10-CM

## 2015-08-21 DIAGNOSIS — N189 Chronic kidney disease, unspecified: Secondary | ICD-10-CM | POA: Insufficient documentation

## 2015-08-21 LAB — CBC WITH DIFFERENTIAL/PLATELET
Basophils Absolute: 0 10*3/uL (ref 0–0.1)
Basophils Relative: 0 %
Eosinophils Absolute: 0.2 10*3/uL (ref 0–0.7)
Eosinophils Relative: 2 %
HCT: 42.5 % (ref 40.0–52.0)
Hemoglobin: 14.5 g/dL (ref 13.0–18.0)
Lymphocytes Relative: 20 %
Lymphs Abs: 1.5 10*3/uL (ref 1.0–3.6)
MCH: 31.9 pg (ref 26.0–34.0)
MCHC: 34 g/dL (ref 32.0–36.0)
MCV: 93.8 fL (ref 80.0–100.0)
Monocytes Absolute: 0.6 10*3/uL (ref 0.2–1.0)
Monocytes Relative: 8 %
Neutro Abs: 5 10*3/uL (ref 1.4–6.5)
Neutrophils Relative %: 70 %
Platelets: 271 10*3/uL (ref 150–440)
RBC: 4.53 MIL/uL (ref 4.40–5.90)
RDW: 13.6 % (ref 11.5–14.5)
WBC: 7.3 10*3/uL (ref 3.8–10.6)

## 2015-08-21 LAB — BASIC METABOLIC PANEL
Anion gap: 8 (ref 5–15)
BUN: 27 mg/dL — ABNORMAL HIGH (ref 6–20)
CO2: 28 mmol/L (ref 22–32)
Calcium: 9.4 mg/dL (ref 8.9–10.3)
Chloride: 105 mmol/L (ref 101–111)
Creatinine, Ser: 1.19 mg/dL (ref 0.61–1.24)
GFR calc Af Amer: >60 mL/min (ref >60)
GFR calc non Af Amer: 52 mL/min — ABNORMAL LOW (ref >60)
Glucose, Bld: 93 mg/dL (ref 65–99)
Potassium: 4.6 mmol/L (ref 3.5–5.1)
Sodium: 141 mmol/L (ref 135–145)

## 2015-08-21 LAB — LACTATE DEHYDROGENASE: LDH: 135 U/L (ref 98–192)

## 2015-08-21 LAB — URIC ACID: Uric Acid, Serum: 6.5 mg/dL (ref 4.4–7.6)

## 2015-08-21 NOTE — Progress Notes (Signed)
[facility] day:  08/21/2015  Chief Complaint: Harold Chavez is a 81 y.o. male with CLL who is seen for 6 month assessment.  HPI: The patient was last seen in the medical oncology clinic on 02/16/2015.  At that time, he was seen for 3 month assessment.  He felt pretty good. He denied any fevers, sweats, or weight loss.  Labs included a hematocrit of 41.8, hemoglobin 13.9, MCV 94.1, platelets 264,000, WBC 7600 with an ANC of 5200.  LDH was 120.  Uric acid was 6.8.  During the interim, he has done well.  He notes "not a whole lot" of difference since last appointment.  He denies any adenopathy, B symptoms or infections.  He is doing some housework.  Past Medical History  Diagnosis Date  . Benign paroxysmal positional vertigo   . History of shingles 2010  . Sarcoidosis ([place])   . Lentigo maligna of right cheek 2015    s/p Mohs ([place])  . B12 deficiency anemia   . 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 Leukemia ([place])     CLL  . Chronic kidney disease   . IgG gammopathy     Past Surgical History  Procedure Laterality Date  . Testicle removal  1937    right; undescended  . Mohs surgery  2015    R cheek lentigo maligna in situ    Family History  Problem Relation Age of Onset  . Stroke Father     Social History:  reports that he has never smoked. He has never used smokeless tobacco. He reports that he does not drink alcohol or use illicit drugs.  The patient is accompanied by his son, [name],  today.  Allergies: No Known Allergies  Current Medications: Current Outpatient Prescriptions  Medication Sig Dispense Refill  . aspirin 81 MG tablet Take 81 mg by mouth daily.    . cyanocobalamin (,VITAMIN B-12,) 1000 MCG/ML injection Inject 1 mL (1,000 mcg total) into the muscle every 30 (thirty) days. Start 10/2013    . diltiazem (CARDIZEM) 60 MG tablet Take 1 tablet (60 mg total) by mouth every other day. 90 tablet  1  . ENSURE PLUS (ENSURE PLUS) LIQD Take 237 mLs by mouth.    Marland Kitchen ipratropium (ATROVENT) 0.02 % nebulizer solution Take 0.5 mg by nebulization 4 (four) times daily.    Marland Kitchen ketoconazole (NIZORAL) 2 % cream Apply 1 application topically 2 (two) times daily.    . Probiotic Product (PROBIOTIC DAILY PO) Take by mouth daily.     No current facility-administered medications for this visit.    Review of Systems:  GENERAL:  Feels good.  No fevers, sweats or weight loss. PERFORMANCE STATUS (ECOG):  1 HEENT:  No visual changes, runny nose, sore throat, mouth sores or tenderness. Lungs: No shortness of breath or cough.  No hemoptysis. Cardiac:  No chest pain, palpitations, orthopnea, or PND. GI:  No nausea, vomiting, diarrhea, constipation, melena or hematochezia. GU:  No urgency, frequency, dysuria, or hematuria. Musculoskeletal:  Denies bone pain.  No joint pain.  No muscle tenderness. Extremities:  No pain or swelling. Skin:  No rashes or skin changes. Neuro:  No headache, numbness or weakness, balance or coordination issues. Endocrine:  No diabetes, thyroid issues, hot flashes or night sweats. Psych:  No mood changes, depression or anxiety. Pain:  No focal pain. Review of systems:  All other systems reviewed and found to be  negative.  Physical Exam: Blood pressure 127/76, pulse 80, temperature 97 F (36.1 C), temperature source Tympanic, resp. rate 18, height 6' (1.829 m), weight 153 lb (69.4 kg). GENERAL:  Well developed, well nourished, sitting comfortably in the exam room in no acute distress. MENTAL STATUS:  Alert and oriented to person, place and time. HEAD:  Short gray hair.  Normocephalic, atraumatic, face symmetric, no Cushingoid features. EYES:  Glasses.  Blue eyes.  Pupils equal round and reactive to light and accomodation.  No conjunctivitis or scleral icterus. ENT:  Oropharynx clear without lesion.  Tongue normal. Mucous membranes moist.  RESPIRATORY:  Clear to auscultation without  rales, wheezes or rhonchi. CARDIOVASCULAR:  Regular rate and rhythm without murmur, rub or gallop. ABDOMEN:  Soft, non-tender, with active bowel sounds, and no hepatosplenomegaly.  No masses. SKIN:  Ecchymosis dorsum of right hand.  No rashes, ulcers or lesions. EXTREMITIES: 4 cm cyst right elbow (old).  No edema, no skin discoloration or tenderness.  No palpable cords. LYMPH NODES: No palpable cervical, supraclavicular, axillary or inguinal adenopathy  NEUROLOGICAL: Unremarkable. PSYCH:  Appropriate.  Appointment on 08/21/2015  Component Date Value Ref Range Status  . WBC 08/21/2015 7.3  3.8 - 10.6 K/uL Final  . RBC 08/21/2015 4.53  4.40 - 5.90 MIL/uL Final  . Hemoglobin 08/21/2015 14.5  13.0 - 18.0 g/dL Final  . HCT 08/21/2015 42.5  40.0 - 52.0 % Final  . MCV 08/21/2015 93.8  80.0 - 100.0 fL Final  . MCH 08/21/2015 31.9  26.0 - 34.0 pg Final  . MCHC 08/21/2015 34.0  32.0 - 36.0 g/dL Final  . RDW 08/21/2015 13.6  11.5 - 14.5 % Final  . Platelets 08/21/2015 271  150 - 440 K/uL Final  . Neutrophils Relative % 08/21/2015 70   Final  . Neutro Abs 08/21/2015 5.0  1.4 - 6.5 K/uL Final  . Lymphocytes Relative 08/21/2015 20   Final  . Lymphs Abs 08/21/2015 1.5  1.0 - 3.6 K/uL Final  . Monocytes Relative 08/21/2015 8   Final  . Monocytes Absolute 08/21/2015 0.6  0.2 - 1.0 K/uL Final  . Eosinophils Relative 08/21/2015 2   Final  . Eosinophils Absolute 08/21/2015 0.2  0 - 0.7 K/uL Final  . Basophils Relative 08/21/2015 0   Final  . Basophils Absolute 08/21/2015 0.0  0 - 0.1 K/uL Final  . Sodium 08/21/2015 141  135 - 145 mmol/L Final  . Potassium 08/21/2015 4.6  3.5 - 5.1 mmol/L Final  . Chloride 08/21/2015 105  101 - 111 mmol/L Final  . CO2 08/21/2015 28  22 - 32 mmol/L Final  . Glucose, Bld 08/21/2015 93  65 - 99 mg/dL Final  . BUN 08/21/2015 27* 6 - 20 mg/dL Final  . Creatinine, Ser 08/21/2015 1.19  0.61 - 1.24 mg/dL Final  . Calcium 08/21/2015 9.4  8.9 - 10.3 mg/dL Final  . GFR calc  non Af Amer 08/21/2015 52* >60 mL/min Final  . GFR calc Af Amer 08/21/2015 >60  >60 mL/min Final   Comment: (NOTE) The eGFR has been calculated using the CKD EPI equation. This calculation has not been validated in all clinical situations. eGFR's persistently <60 mL/min signify possible Chronic Kidney Disease.   . Anion gap 08/21/2015 8  5 - 15 Final  . LDH 08/21/2015 135  98 - 192 U/L Final  . Uric Acid, Serum 08/21/2015 6.5  4.4 - 7.6 mg/dL Final    Assessment:  MARSHEL GOLUBSKI is a 81 y.o. male  with chronic lymphocytic leukemia (CLL).  He presented in 10/2013 with dehydration and syncope.  Evaluation revealed axillary adenopathy.  He received 5 of 6 cycles of planned bendamustine and Rituxan (12/17/2013 - 04/27/2014).  With cycle #5 he developed neutropenia and received GCSF.  He was subsequently admitted with sepsis and pneumonia.  Course was complicated by C diff.  Decision was made to defer his last cycle of chemotherapy.  He has a monoclonal increase of IgG of unclear significance.  IgG was 1101 on 08/19/2014.  Review of prior labs noted no M-spike, but kappa light chains on 12/01/2013.  At that time, kappa free light chains were 677.68 (high), lambda free light chains 453.8 (high), but a normal ratio (1.49).  Kappa free light chains were 34.64 (high), lambda free light chains 22.11 (high), with a normal ratio (1.57) on 11/17/2014.    Symptomatically, he is doing well.  He denies any B symptoms or infections.  Exam reveals no adenopathy or hepatosplenomegaly.  Counts are stable.  Plan: 1. Labs today: CBC with diff, BMP, LDH, uric acid.  2. RTC in 6 months for MD assessment and labs (CBC, BMP, LDH).   Lequita Asal, MD  08/21/2015

## 2015-08-31 ENCOUNTER — Ambulatory Visit (INDEPENDENT_AMBULATORY_CARE_PROVIDER_SITE_OTHER): Payer: Medicare Other

## 2015-08-31 DIAGNOSIS — E538 Deficiency of other specified B group vitamins: Secondary | ICD-10-CM | POA: Diagnosis not present

## 2015-08-31 MED ORDER — CYANOCOBALAMIN 1000 MCG/ML IJ SOLN
1000.0000 ug | Freq: Once | INTRAMUSCULAR | Status: AC
  Administered 2015-08-31: 1000 ug via INTRAMUSCULAR

## 2015-09-05 ENCOUNTER — Other Ambulatory Visit: Payer: Self-pay | Admitting: Family Medicine

## 2015-10-01 DIAGNOSIS — D049 Carcinoma in situ of skin, unspecified: Secondary | ICD-10-CM

## 2015-10-01 DIAGNOSIS — H903 Sensorineural hearing loss, bilateral: Secondary | ICD-10-CM

## 2015-10-01 DIAGNOSIS — C4491 Basal cell carcinoma of skin, unspecified: Secondary | ICD-10-CM

## 2015-10-01 HISTORY — DX: Basal cell carcinoma of skin, unspecified: C44.91

## 2015-10-01 HISTORY — DX: Sensorineural hearing loss, bilateral: H90.3

## 2015-10-01 HISTORY — DX: Carcinoma in situ of skin, unspecified: D04.9

## 2015-10-03 ENCOUNTER — Ambulatory Visit (INDEPENDENT_AMBULATORY_CARE_PROVIDER_SITE_OTHER): Payer: Medicare Other

## 2015-10-03 DIAGNOSIS — E538 Deficiency of other specified B group vitamins: Secondary | ICD-10-CM

## 2015-10-03 MED ORDER — CYANOCOBALAMIN 1000 MCG/ML IJ SOLN
1000.0000 ug | Freq: Once | INTRAMUSCULAR | Status: AC
  Administered 2015-10-03: 1000 ug via INTRAMUSCULAR

## 2015-10-12 ENCOUNTER — Other Ambulatory Visit: Payer: Self-pay | Admitting: Family Medicine

## 2015-10-12 DIAGNOSIS — C8300 Small cell B-cell lymphoma, unspecified site: Secondary | ICD-10-CM

## 2015-10-12 DIAGNOSIS — D869 Sarcoidosis, unspecified: Secondary | ICD-10-CM

## 2015-10-12 DIAGNOSIS — D472 Monoclonal gammopathy: Secondary | ICD-10-CM

## 2015-10-12 DIAGNOSIS — E786 Lipoprotein deficiency: Secondary | ICD-10-CM

## 2015-10-12 DIAGNOSIS — N183 Chronic kidney disease, stage 3 unspecified: Secondary | ICD-10-CM

## 2015-10-12 DIAGNOSIS — E441 Mild protein-calorie malnutrition: Secondary | ICD-10-CM

## 2015-10-12 DIAGNOSIS — R591 Generalized enlarged lymph nodes: Secondary | ICD-10-CM

## 2015-10-12 DIAGNOSIS — D531 Other megaloblastic anemias, not elsewhere classified: Secondary | ICD-10-CM

## 2015-10-13 ENCOUNTER — Other Ambulatory Visit (INDEPENDENT_AMBULATORY_CARE_PROVIDER_SITE_OTHER): Payer: Medicare Other

## 2015-10-13 DIAGNOSIS — E441 Mild protein-calorie malnutrition: Secondary | ICD-10-CM | POA: Diagnosis not present

## 2015-10-13 DIAGNOSIS — E786 Lipoprotein deficiency: Secondary | ICD-10-CM | POA: Diagnosis not present

## 2015-10-13 LAB — COMPREHENSIVE METABOLIC PANEL
ALK PHOS: 74 U/L (ref 39–117)
ALT: 13 U/L (ref 0–53)
AST: 17 U/L (ref 0–37)
Albumin: 4.1 g/dL (ref 3.5–5.2)
BUN: 22 mg/dL (ref 6–23)
CHLORIDE: 104 meq/L (ref 96–112)
CO2: 32 mEq/L (ref 19–32)
Calcium: 9.7 mg/dL (ref 8.4–10.5)
Creatinine, Ser: 1.31 mg/dL (ref 0.40–1.50)
GFR: 54.72 mL/min — AB (ref >60.00)
GLUCOSE: 93 mg/dL (ref 70–99)
POTASSIUM: 4.5 meq/L (ref 3.5–5.1)
SODIUM: 141 meq/L (ref 135–145)
TOTAL PROTEIN: 7.5 g/dL (ref 6.0–8.3)
Total Bilirubin: 0.8 mg/dL (ref 0.2–1.2)

## 2015-10-13 LAB — LIPID PANEL
CHOL/HDL RATIO: 3
Cholesterol: 129 mg/dL (ref 0–200)
HDL: 41.7 mg/dL (ref >39.00)
LDL CALC: 74 mg/dL (ref 0–99)
NONHDL: 87.54
Triglycerides: 70 mg/dL (ref 0.0–149.0)
VLDL: 14 mg/dL (ref 0.0–40.0)

## 2015-10-13 LAB — MAGNESIUM: Magnesium: 2 mg/dL (ref 1.5–2.5)

## 2015-10-13 LAB — TSH: TSH: 2.46 u[IU]/mL (ref 0.35–4.50)

## 2015-10-16 ENCOUNTER — Ambulatory Visit (INDEPENDENT_AMBULATORY_CARE_PROVIDER_SITE_OTHER): Payer: Medicare Other | Admitting: Family Medicine

## 2015-10-16 ENCOUNTER — Encounter: Payer: Self-pay | Admitting: Family Medicine

## 2015-10-16 ENCOUNTER — Telehealth: Payer: Self-pay | Admitting: Family Medicine

## 2015-10-16 VITALS — BP 122/78 | HR 96 | Temp 97.5°F | Wt 156.8 lb

## 2015-10-16 DIAGNOSIS — H9190 Unspecified hearing loss, unspecified ear: Secondary | ICD-10-CM | POA: Insufficient documentation

## 2015-10-16 DIAGNOSIS — H9193 Unspecified hearing loss, bilateral: Secondary | ICD-10-CM

## 2015-10-16 DIAGNOSIS — Z23 Encounter for immunization: Secondary | ICD-10-CM

## 2015-10-16 DIAGNOSIS — Z0000 Encounter for general adult medical examination without abnormal findings: Secondary | ICD-10-CM | POA: Diagnosis not present

## 2015-10-16 DIAGNOSIS — Z7189 Other specified counseling: Secondary | ICD-10-CM

## 2015-10-16 DIAGNOSIS — R000 Tachycardia, unspecified: Secondary | ICD-10-CM

## 2015-10-16 DIAGNOSIS — N183 Chronic kidney disease, stage 3 unspecified: Secondary | ICD-10-CM

## 2015-10-16 DIAGNOSIS — E1122 Type 2 diabetes mellitus with diabetic chronic kidney disease: Secondary | ICD-10-CM

## 2015-10-16 DIAGNOSIS — D531 Other megaloblastic anemias, not elsewhere classified: Secondary | ICD-10-CM

## 2015-10-16 DIAGNOSIS — C8300 Small cell B-cell lymphoma, unspecified site: Secondary | ICD-10-CM

## 2015-10-16 NOTE — Patient Instructions (Addendum)
Check b12 level prior to your next shot.  prevnar today.  Think about hearing doctor evaluation and let me know if you'd like to be seen by them.  Bring me a copy of living will to update chart.   Health Maintenance, Male A healthy lifestyle and preventative care can promote health and wellness.  Maintain regular health, dental, and eye exams.  Eat a healthy diet. Foods like vegetables, fruits, whole grains, low-fat dairy products, and lean protein foods contain the nutrients you need and are low in calories. Decrease your intake of foods high in solid fats, added sugars, and salt. Get information about a proper diet from your health care provider, if necessary.  Regular physical exercise is one of the most important things you can do for your health. Most adults should get at least 150 minutes of moderate-intensity exercise (any activity that increases your heart rate and causes you to sweat) each week. In addition, most adults need muscle-strengthening exercises on 2 or more days a week.   Maintain a healthy weight. The body mass index (BMI) is a screening tool to identify possible weight problems. It provides an estimate of body fat based on height and weight. Your health care provider can find your BMI and can help you achieve or maintain a healthy weight. For males 20 years and older:  A BMI below 18.5 is considered underweight.  A BMI of 18.5 to 24.9 is normal.  A BMI of 25 to 29.9 is considered overweight.  A BMI of 30 and above is considered obese.  Maintain normal blood lipids and cholesterol by exercising and minimizing your intake of saturated fat. Eat a balanced diet with plenty of fruits and vegetables. Blood tests for lipids and cholesterol should begin at age 28 and be repeated every 5 years. If your lipid or cholesterol levels are high, you are over age 22, or you are at high risk for heart disease, you may need your cholesterol levels checked more frequently.Ongoing high  lipid and cholesterol levels should be treated with medicines if diet and exercise are not working.  If you smoke, find out from your health care provider how to quit. If you do not use tobacco, do not start.  Lung cancer screening is recommended for adults aged 72-80 years who are at high risk for developing lung cancer because of a history of smoking. A yearly low-dose CT scan of the lungs is recommended for people who have at least a 30-pack-year history of smoking and are current smokers or have quit within the past 15 years. A pack year of smoking is smoking an average of 1 pack of cigarettes a day for 1 year (for example, a 30-pack-year history of smoking could mean smoking 1 pack a day for 30 years or 2 packs a day for 15 years). Yearly screening should continue until the smoker has stopped smoking for at least 15 years. Yearly screening should be stopped for people who develop a health problem that would prevent them from having lung cancer treatment.  If you choose to drink alcohol, do not have more than 2 drinks per day. One drink is considered to be 12 oz (360 mL) of beer, 5 oz (150 mL) of wine, or 1.5 oz (45 mL) of liquor.  Avoid the use of street drugs. Do not share needles with anyone. Ask for help if you need support or instructions about stopping the use of drugs.  High blood pressure causes heart disease and increases the  risk of stroke. High blood pressure is more likely to develop in:  People who have blood pressure in the end of the normal range (100-139/85-89 mm Hg).  People who are overweight or obese.  People who are African American.  If you are 81-61 years of age, have your blood pressure checked every 3-5 years. If you are 36 years of age or older, have your blood pressure checked every year. You should have your blood pressure measured twice--once when you are at a hospital or clinic, and once when you are not at a hospital or clinic. Record the average of the two  measurements. To check your blood pressure when you are not at a hospital or clinic, you can use:  An automated blood pressure machine at a pharmacy.  A home blood pressure monitor.  If you are 53-40 years old, ask your health care provider if you should take aspirin to prevent heart disease.  Diabetes screening involves taking a blood sample to check your fasting blood sugar level. This should be done once every 3 years after age 26 if you are at a normal weight and without risk factors for diabetes. Testing should be considered at a younger age or be carried out more frequently if you are overweight and have at least 1 risk factor for diabetes.  Colorectal cancer can be detected and often prevented. Most routine colorectal cancer screening begins at the age of 11 and continues through age 79. However, your health care provider may recommend screening at an earlier age if you have risk factors for colon cancer. On a yearly basis, your health care provider may provide home test kits to check for hidden blood in the stool. A small camera at the end of a tube may be used to directly examine the colon (sigmoidoscopy or colonoscopy) to detect the earliest forms of colorectal cancer. Talk to your health care provider about this at age 53 when routine screening begins. A direct exam of the colon should be repeated every 5-10 years through age 46, unless early forms of precancerous polyps or small growths are found.  People who are at an increased risk for hepatitis B should be screened for this virus. You are considered at high risk for hepatitis B if:  You were born in a country where hepatitis B occurs often. Talk with your health care provider about which countries are considered high risk.  Your parents were born in a high-risk country and you have not received a shot to protect against hepatitis B (hepatitis B vaccine).  You have HIV or AIDS.  You use needles to inject street drugs.  You live  with, or have sex with, someone who has hepatitis B.  You are a man who has sex with other men (MSM).  You get hemodialysis treatment.  You take certain medicines for conditions like cancer, organ transplantation, and autoimmune conditions.  Hepatitis C blood testing is recommended for all people born from [birth year] through 1963 and any individual with known risk factors for hepatitis C.  Healthy men should no longer receive prostate-specific antigen (PSA) blood tests as part of routine cancer screening. Talk to your health care provider about prostate cancer screening.  Testicular cancer screening is not recommended for adolescents or adult males who have no symptoms. Screening includes self-exam, a health care provider exam, and other screening tests. Consult with your health care provider about any symptoms you have or any concerns you have about testicular cancer.  Practice safe  sex. Use condoms and avoid high-risk sexual practices to reduce the spread of sexually transmitted infections (STIs).  You should be screened for STIs, including gonorrhea and chlamydia if:  You are sexually active and are younger than 24 years.  You are older than 24 years, and your health care provider tells you that you are at risk for this type of infection.  Your sexual activity has changed since you were last screened, and you are at an increased risk for chlamydia or gonorrhea. Ask your health care provider if you are at risk.  If you are at risk of being infected with HIV, it is recommended that you take a prescription medicine daily to prevent HIV infection. This is called pre-exposure prophylaxis (PrEP). You are considered at risk if:  You are a man who has sex with other men (MSM).  You are a heterosexual man who is sexually active with multiple partners.  You take drugs by injection.  You are sexually active with a partner who has HIV.  Talk with your health care provider about whether you are at  high risk of being infected with HIV. If you choose to begin PrEP, you should first be tested for HIV. You should then be tested every 3 months for as long as you are taking PrEP.  Use sunscreen. Apply sunscreen liberally and repeatedly throughout the day. You should seek shade when your shadow is shorter than you. Protect yourself by wearing long sleeves, pants, a wide-brimmed hat, and sunglasses year round whenever you are outdoors.  Tell your health care provider of new moles or changes in moles, especially if there is a change in shape or color. Also, tell your health care provider if a mole is larger than the size of a pencil eraser.  A one-time screening for abdominal aortic aneurysm (AAA) and surgical repair of large AAAs by ultrasound is recommended for men aged 27-75 years who are current or former smokers.  Stay current with your vaccines (immunizations).   This information is not intended to replace advice given to you by your health care provider. Make sure you discuss any questions you have with your health care provider.   Document Released: 03/14/2008 Document Revised: 10/07/2014 Document Reviewed: 02/11/2011 Elsevier Interactive Patient Education [employer].

## 2015-10-16 NOTE — Progress Notes (Signed)
Pre visit review using our clinic review tool, if applicable. No additional management support is needed unless otherwise documented below in the visit note. 

## 2015-10-16 NOTE — Assessment & Plan Note (Signed)
Advanced directive discussion - has at home. Son Grant is HCPOA. Asked to bring me copy. 

## 2015-10-16 NOTE — Assessment & Plan Note (Signed)
Continue B12 shots. Check B12 level next visit prior to shot. Pt agrees.

## 2015-10-16 NOTE — Assessment & Plan Note (Signed)
Discussed audiology referral - pt will let me know if desires referral.

## 2015-10-16 NOTE — Progress Notes (Signed)
BP 122/78 mmHg  Pulse 96  Temp(Src) 97.5 F (36.4 C) (Oral)  Wt 156 lb 12 oz (71.101 kg)   CC: medicare wellness visit  Subjective:    Patient ID: Harold Chavez, male    DOB: 1924/06/07, 82 y.o.   MRN: [id number]  HPI: Harold Chavez is a 82 y.o. male presenting on 10/16/2015 for Annual Exam   Presents with son today. Known small lymphocytic lymphoma and CLL followed by Dr Mike Gip [facility] Q6 months, last visit 08/2015. S/p 5/6 cycles bendamustine and rituxan (2015). Did not receive 6th treatment due to PNA with sepsis and C diff hospitalization. Also with sarcoidosis, IgG gammopathy and B12 deficiency.   Hearing screen - failed Vision screen - eye exam with eye center 09/2014. Pending. Fall risk screen - passed Depression screen - passed  Preventative: Colon cancer screening - aged out Prostate cancer screening - aged out Lung cancer screening - aged out, not candidate Flu shot -yearly 08/2015 Tetanus shot - 2008 Pneumovax - 05/2014, prevnar today Shingles shot - not a candidate Advanced directive discussion - has at home. Son [name] is HCPOA. Asked to bring me copy. Seat belt use discussed Sunscreen use and skin screen discussed. 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 Interim medical history since our last visit reviewed. Allergies and medications reviewed and updated. Current Outpatient Prescriptions on File Prior to Visit  Medication Sig  . aspirin 81 MG tablet Take 81 mg by mouth daily.  . cyanocobalamin (,VITAMIN B-12,) 1000 MCG/ML injection Inject 1 mL (1,000 mcg total) into the muscle every 30 (thirty) days. Start 10/2013  . diltiazem (CARDIZEM) 60 MG tablet Take 1 tablet (60 mg total) by mouth every other day.  . ENSURE PLUS (ENSURE  PLUS) LIQD Take 237 mLs by mouth.  . Probiotic Product (PROBIOTIC DAILY PO) Take by mouth daily.  Marland Kitchen ipratropium (ATROVENT) 0.02 % nebulizer solution Take 0.5 mg by nebulization 4 (four) times daily. Reported on 10/16/2015  . ketoconazole (NIZORAL) 2 % cream Apply 1 application topically 2 (two) times daily. Reported on 10/16/2015   No current facility-administered medications on file prior to visit.    Review of Systems Per HPI unless specifically indicated in ROS section     Objective:    BP 122/78 mmHg  Pulse 96  Temp(Src) 97.5 F (36.4 C) (Oral)  Wt 156 lb 12 oz (71.101 kg)  Wt Readings from Last 3 Encounters:  10/16/15 156 lb 12 oz (71.101 kg)  08/21/15 153 lb (69.4 kg)  04/14/15 149 lb 4 oz (67.699 kg)   Body mass index is 21.25 kg/(m^2).  Physical Exam  Constitutional: He is oriented to person, place, and time. He appears well-developed and well-nourished. No distress.  HENT:  Head: Normocephalic and atraumatic.  Right Ear: Hearing, tympanic membrane, external ear and ear canal normal.  Left Ear: Hearing, tympanic membrane, external ear and ear canal normal.  Nose: Nose normal.  Mouth/Throat: Uvula is midline, oropharynx is clear and moist and mucous membranes are normal. No oropharyngeal exudate, posterior oropharyngeal edema or posterior oropharyngeal erythema.  Eyes: Conjunctivae and EOM are normal. Pupils are equal, round, and reactive to light. No scleral icterus.  Neck: Normal range of motion. Neck supple. Carotid bruit is not present. No  thyromegaly present.  Cardiovascular: Normal rate, regular rhythm, normal heart sounds and intact distal pulses.   No murmur heard. Pulses:      Radial pulses are 2+ on the right side, and 2+ on the left side.  Pulmonary/Chest: Effort normal and breath sounds normal. No respiratory distress. He has no wheezes. He has no rales.  Abdominal: Soft. Bowel sounds are normal. He exhibits no distension and no mass. There is no tenderness.  There is no rebound and no guarding.  Musculoskeletal: Normal range of motion. He exhibits no edema.  Stable lipoma R prox forearm  Lymphadenopathy:    He has no cervical adenopathy.  Neurological: He is alert and oriented to person, place, and time.  CN grossly intact, station and gait intact Recall 3/3 Calculation 5/5 serial 3s  Skin: Skin is warm and dry. No rash noted.  Psychiatric: He has a normal mood and affect. His behavior is normal. Judgment and thought content normal.  Nursing note and vitals reviewed.  Results for orders placed or performed in visit on 10/13/15  Lipid panel  Result Value Ref Range   Cholesterol 129 0 - 200 mg/dL   Triglycerides 70.0 0.0 - 149.0 mg/dL   HDL 41.70 >39.00 mg/dL   VLDL 14.0 0.0 - 40.0 mg/dL   LDL Cholesterol 74 0 - 99 mg/dL   Total CHOL/HDL Ratio 3    NonHDL 87.54   Comprehensive metabolic panel  Result Value Ref Range   Sodium 141 135 - 145 mEq/L   Potassium 4.5 3.5 - 5.1 mEq/L   Chloride 104 96 - 112 mEq/L   CO2 32 19 - 32 mEq/L   Glucose, Bld 93 70 - 99 mg/dL   BUN 22 6 - 23 mg/dL   Creatinine, Ser 1.31 0.40 - 1.50 mg/dL   Total Bilirubin 0.8 0.2 - 1.2 mg/dL   Alkaline Phosphatase 74 39 - 117 U/L   AST 17 0 - 37 U/L   ALT 13 0 - 53 U/L   Total Protein 7.5 6.0 - 8.3 g/dL   Albumin 4.1 3.5 - 5.2 g/dL   Calcium 9.7 8.4 - 10.5 mg/dL   GFR 54.72 (L) >60.00 mL/min  Magnesium  Result Value Ref Range   Magnesium 2.0 1.5 - 2.5 mg/dL  TSH  Result Value Ref Range   TSH 2.46 0.35 - 4.50 uIU/mL   Lab Results  Component Value Date   VITAMINB12 276 07/14/2014       Assessment & Plan:   Problem List Items Addressed This Visit    Tachycardia    HR remains on high side of normal. Continue cardizem QOD.      Megaloblastic anemia due to B12 deficiency    Continue B12 shots. Check B12 level next visit prior to shot. Pt agrees.      Relevant Orders   Vitamin B12   Medicare annual wellness visit, initial - Primary    I have  personally reviewed the Medicare Annual Wellness questionnaire and have noted 1. The patient's medical and social history 2. Their use of alcohol, tobacco or illicit drugs 3. Their current medications and supplements 4. The patient's functional ability including ADL's, fall risks, home safety risks and hearing or visual impairment. Cognitive function has been assessed and addressed as indicated.  5. Diet and physical activity 6. Evidence for depression or mood disorders The patients weight, height, BMI have been recorded in the chart. I have made referrals, counseling and provided education to the patient based on review  of the above and I have provided the pt with a written personalized care plan for preventive services. Provider list updated.. See scanned questionairre as needed for further documentation. Reviewed preventative protocols and updated unless pt declined.       Lymphoma, small lymphocytic ([place])    Seems to be in remission. Q6 mo onc visits.      Decreased hearing    Discussed audiology referral - pt will let me know if desires referral.      CKD stage 3 secondary to diabetes [facility])    Reviewed labs with patient.      Advanced care planning/counseling discussion    Advanced directive discussion - has at home. Son [name] is HCPOA. Asked to bring me copy.          Follow up plan: Return in about 1 year (around 10/15/2016), or as needed, for medicare wellness visit.

## 2015-10-16 NOTE — Telephone Encounter (Signed)
referral placed

## 2015-10-16 NOTE — Assessment & Plan Note (Signed)
HR remains on high side of normal. Continue cardizem QOD.

## 2015-10-16 NOTE — Telephone Encounter (Signed)
Patient's son,Grant,called and said the family discussed patient going to an audiologist.  They would like him to be referred to an audiologist.  Patient can go anytime.  Patient prefers [place],but is willing to go to [place].

## 2015-10-16 NOTE — Assessment & Plan Note (Signed)
Seems to be in remission. Q6 mo onc visits.

## 2015-10-16 NOTE — Assessment & Plan Note (Signed)
Reviewed labs with patient. °

## 2015-10-16 NOTE — Assessment & Plan Note (Signed)

## 2015-10-16 NOTE — Addendum Note (Signed)
Addended by: Royann Shivers A on: 10/16/2015 09:55 AM   Modules accepted: Orders

## 2015-10-24 DIAGNOSIS — H5203 Hypermetropia, bilateral: Secondary | ICD-10-CM | POA: Diagnosis not present

## 2015-10-24 DIAGNOSIS — H2513 Age-related nuclear cataract, bilateral: Secondary | ICD-10-CM | POA: Diagnosis not present

## 2015-10-25 DIAGNOSIS — H903 Sensorineural hearing loss, bilateral: Secondary | ICD-10-CM | POA: Diagnosis not present

## 2015-10-31 ENCOUNTER — Encounter: Payer: Self-pay | Admitting: Family Medicine

## 2015-11-03 ENCOUNTER — Ambulatory Visit (INDEPENDENT_AMBULATORY_CARE_PROVIDER_SITE_OTHER): Payer: Medicare Other | Admitting: *Deleted

## 2015-11-03 DIAGNOSIS — E538 Deficiency of other specified B group vitamins: Secondary | ICD-10-CM

## 2015-11-03 DIAGNOSIS — D531 Other megaloblastic anemias, not elsewhere classified: Secondary | ICD-10-CM | POA: Diagnosis not present

## 2015-11-03 LAB — VITAMIN B12: Vitamin B-12: 472 pg/mL (ref 211–911)

## 2015-11-03 MED ORDER — CYANOCOBALAMIN 1000 MCG/ML IJ SOLN: 1000.0000 ug | Freq: Once | INTRAMUSCULAR | Status: DC

## 2015-11-03 MED ORDER — CYANOCOBALAMIN 1000 MCG/ML IJ SOLN
1000.0000 ug | Freq: Once | INTRAMUSCULAR | Status: AC
  Administered 2015-11-03: 1000 ug via INTRAMUSCULAR

## 2015-11-06 ENCOUNTER — Other Ambulatory Visit: Payer: Self-pay | Admitting: Family Medicine

## 2015-11-06 DIAGNOSIS — D531 Other megaloblastic anemias, not elsewhere classified: Secondary | ICD-10-CM

## 2015-11-06 MED ORDER — VITAMIN B-12 1000 MCG PO TABS: 1000.0000 ug | ORAL_TABLET | Freq: Every day | ORAL | Status: DC

## 2015-11-07 ENCOUNTER — Encounter: Payer: Self-pay | Admitting: *Deleted

## 2015-11-14 ENCOUNTER — Encounter: Payer: Self-pay | Admitting: Hematology and Oncology

## 2015-11-22 DIAGNOSIS — C44321 Squamous cell carcinoma of skin of nose: Secondary | ICD-10-CM | POA: Diagnosis not present

## 2015-11-22 DIAGNOSIS — D485 Neoplasm of uncertain behavior of skin: Secondary | ICD-10-CM | POA: Diagnosis not present

## 2015-11-22 DIAGNOSIS — C44622 Squamous cell carcinoma of skin of right upper limb, including shoulder: Secondary | ICD-10-CM | POA: Diagnosis not present

## 2015-11-22 DIAGNOSIS — L821 Other seborrheic keratosis: Secondary | ICD-10-CM | POA: Diagnosis not present

## 2015-11-22 DIAGNOSIS — Z85820 Personal history of malignant melanoma of skin: Secondary | ICD-10-CM | POA: Diagnosis not present

## 2015-11-25 IMAGING — CR DG CHEST 1V PORT
1 series · 1 of 1 positions shown · non-contrast
Comparison: 05/09/2014 and 11/06/2013 chest radiographs

CLINICAL DATA: 89-year-old male with PICC line placement.

EXAM:
PORTABLE CHEST - 1 VIEW

[ap]
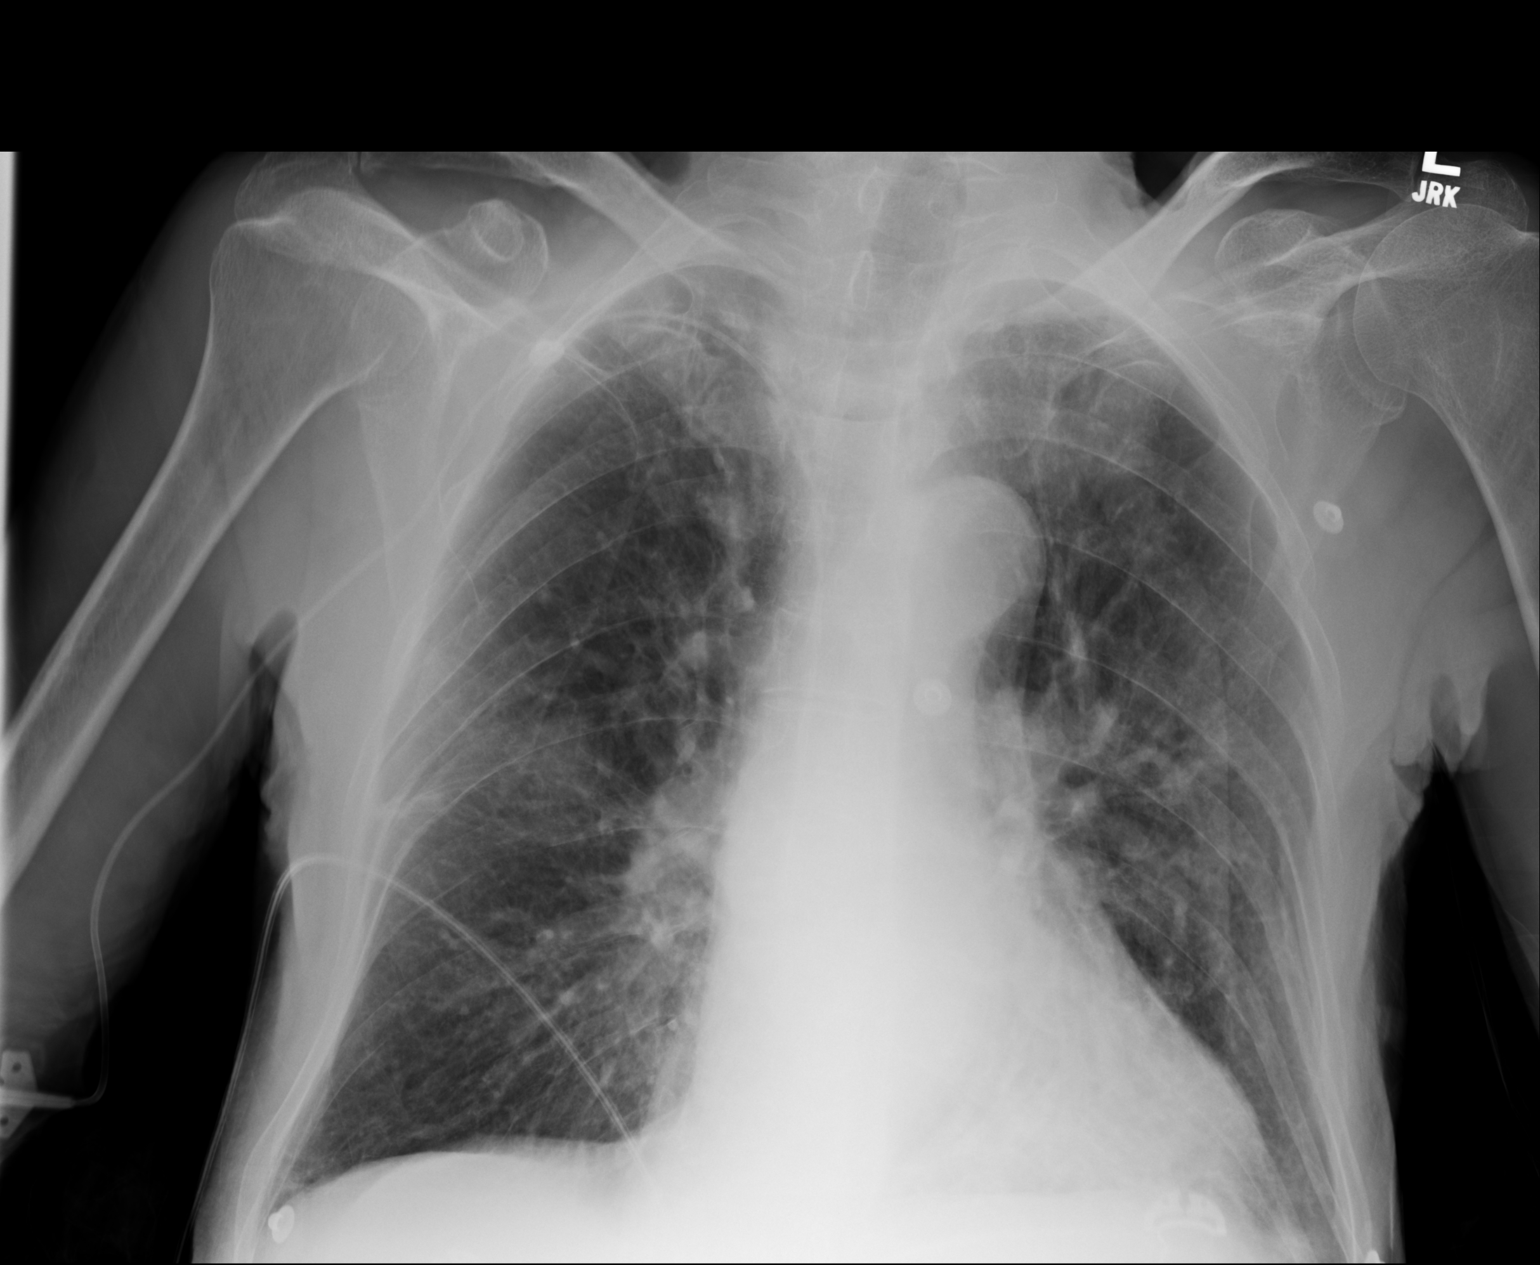

[1 of 1 positions shown; findings below may reference images not displayed]

FINDINGS: A right PICC line is now identified with tip overlying the superior
cavoatrial junction.

Upper limits normal heart size, pulmonary vascular congestion and
focal right mid lung opacity again noted.

There is no evidence of pneumothorax.

No pleural effusions are identified.
IMPRESSION: Right PICC line with tip overlying the superior cavoatrial junction.

Otherwise unchanged chest with possible airspace opacity in the mid
right lung.

## 2015-12-05 ENCOUNTER — Ambulatory Visit: Payer: Medicare Other

## 2015-12-09 ENCOUNTER — Encounter: Payer: Self-pay | Admitting: Family Medicine

## 2015-12-25 IMAGING — CR DG CHEST 1V PORT
1 series · 1 of 1 positions shown · non-contrast
Comparison: 05/09/2014

CLINICAL DATA: Previous admission a month ago for pneumonia and
dehydration. Now patient is having diarrhea and not feeling well.
Fever.

EXAM:
PORTABLE CHEST - 1 VIEW

[ap]
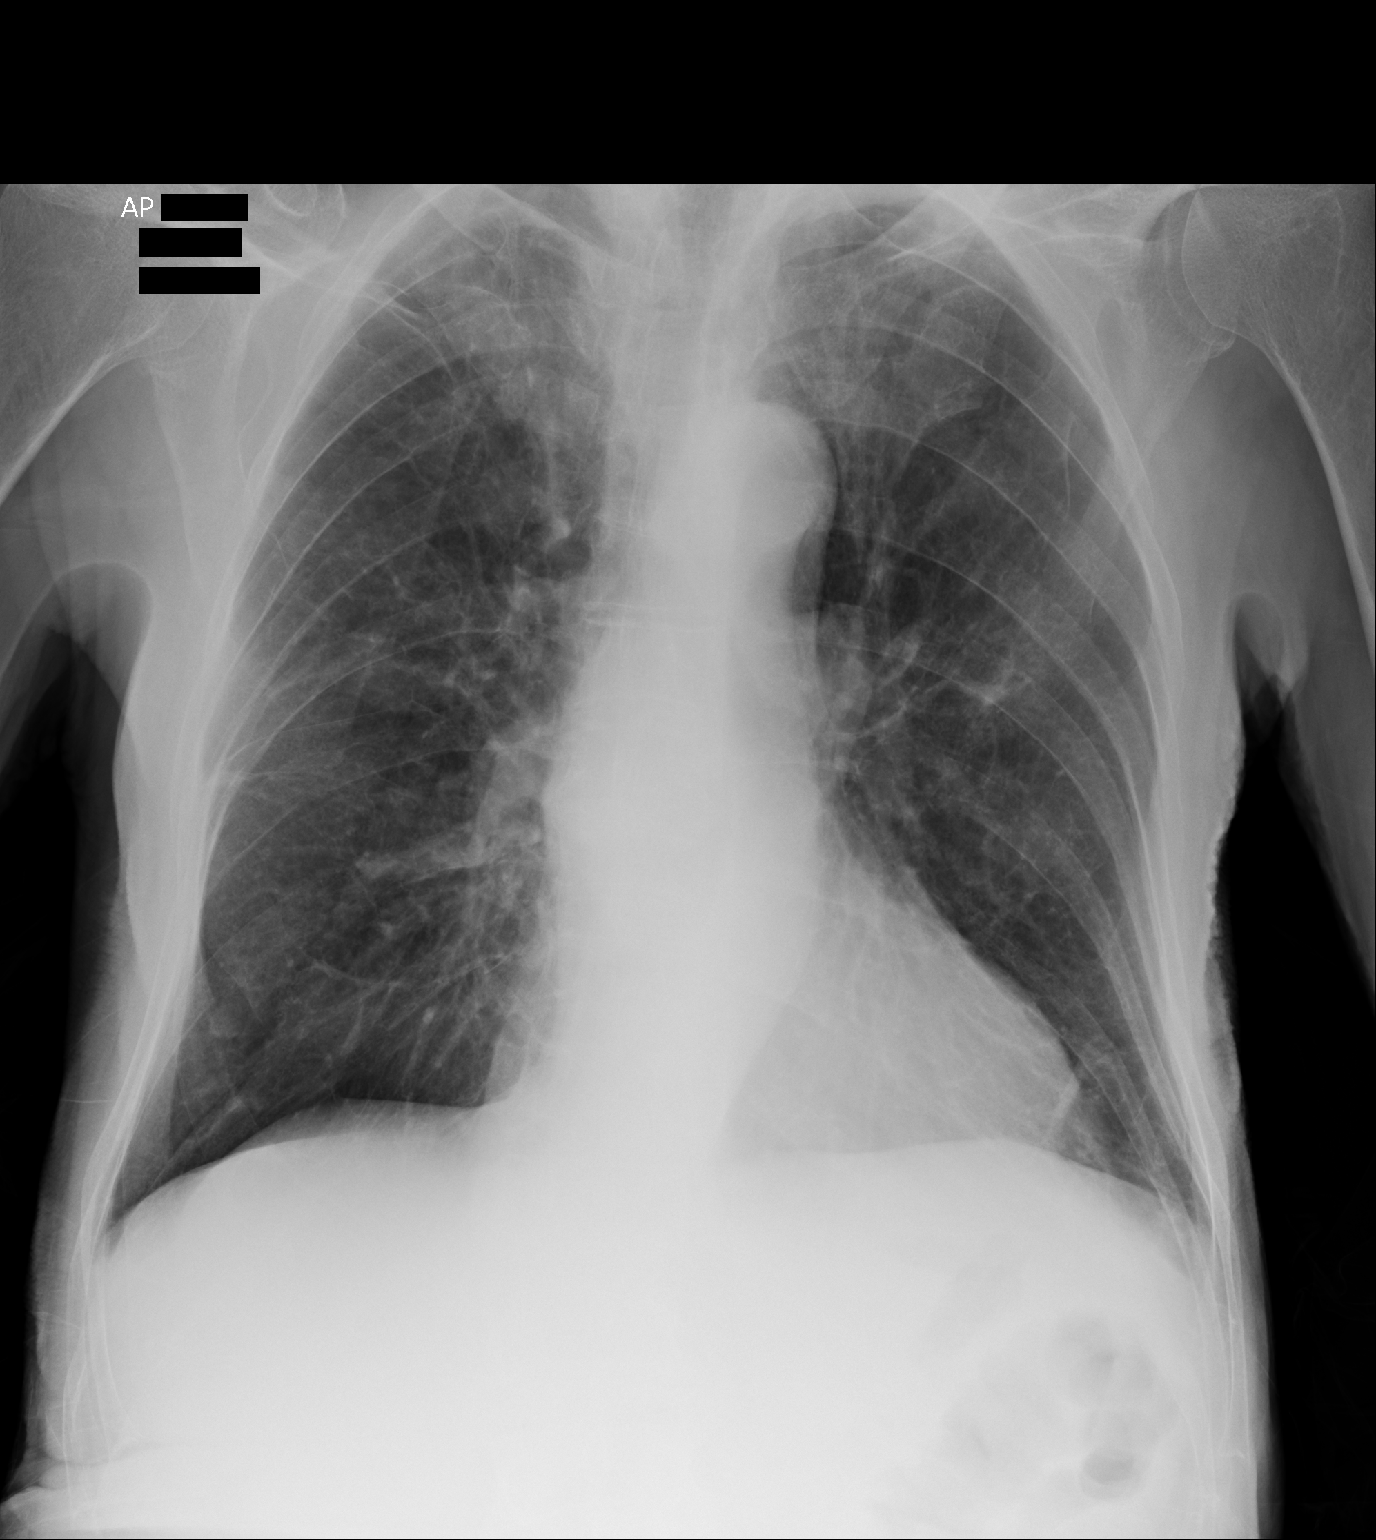

[1 of 1 positions shown; findings below may reference images not displayed]

FINDINGS: Of the normal heart size and pulmonary vascularity. Emphysematous
changes and scattered fibrosis in the lungs. No focal airspace
disease or consolidation. No blunting of costophrenic angles. No
pneumothorax. Tortuous aorta.
IMPRESSION: Emphysematous changes and fibrosis in the lungs. No evidence of
active pulmonary disease.

## 2015-12-31 IMAGING — CR DG CHEST 2V
1 series · 3 of 3 positions shown · non-contrast
Comparison: 06/08/2014

CLINICAL DATA: Shortness of breath

EXAM:
CHEST  2 VIEW

[Series 1: x chest ap · 0.14mm/px · 3 of 3 slices shown]
[im 1/3]
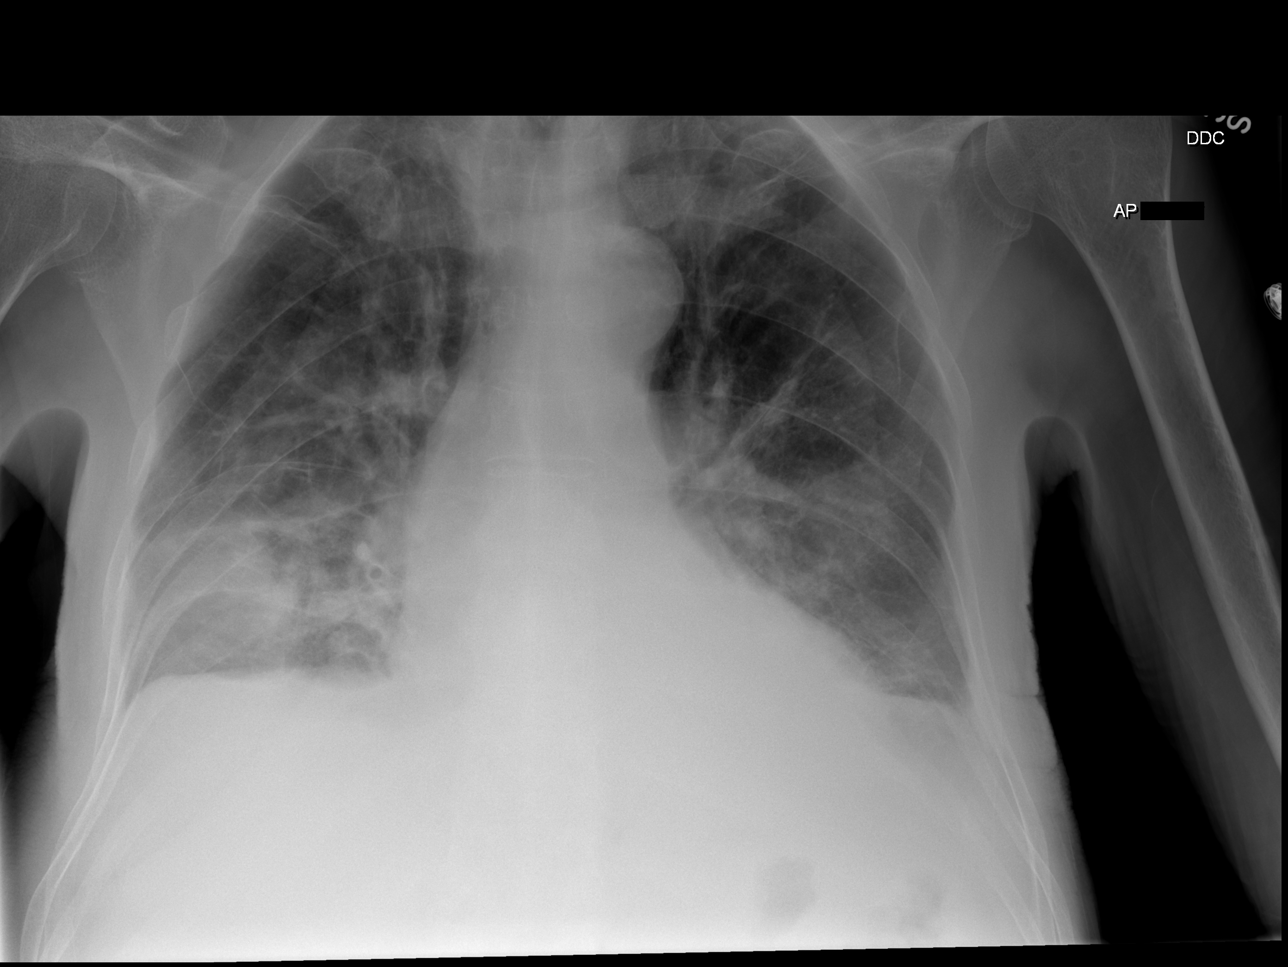
[im 2/3]
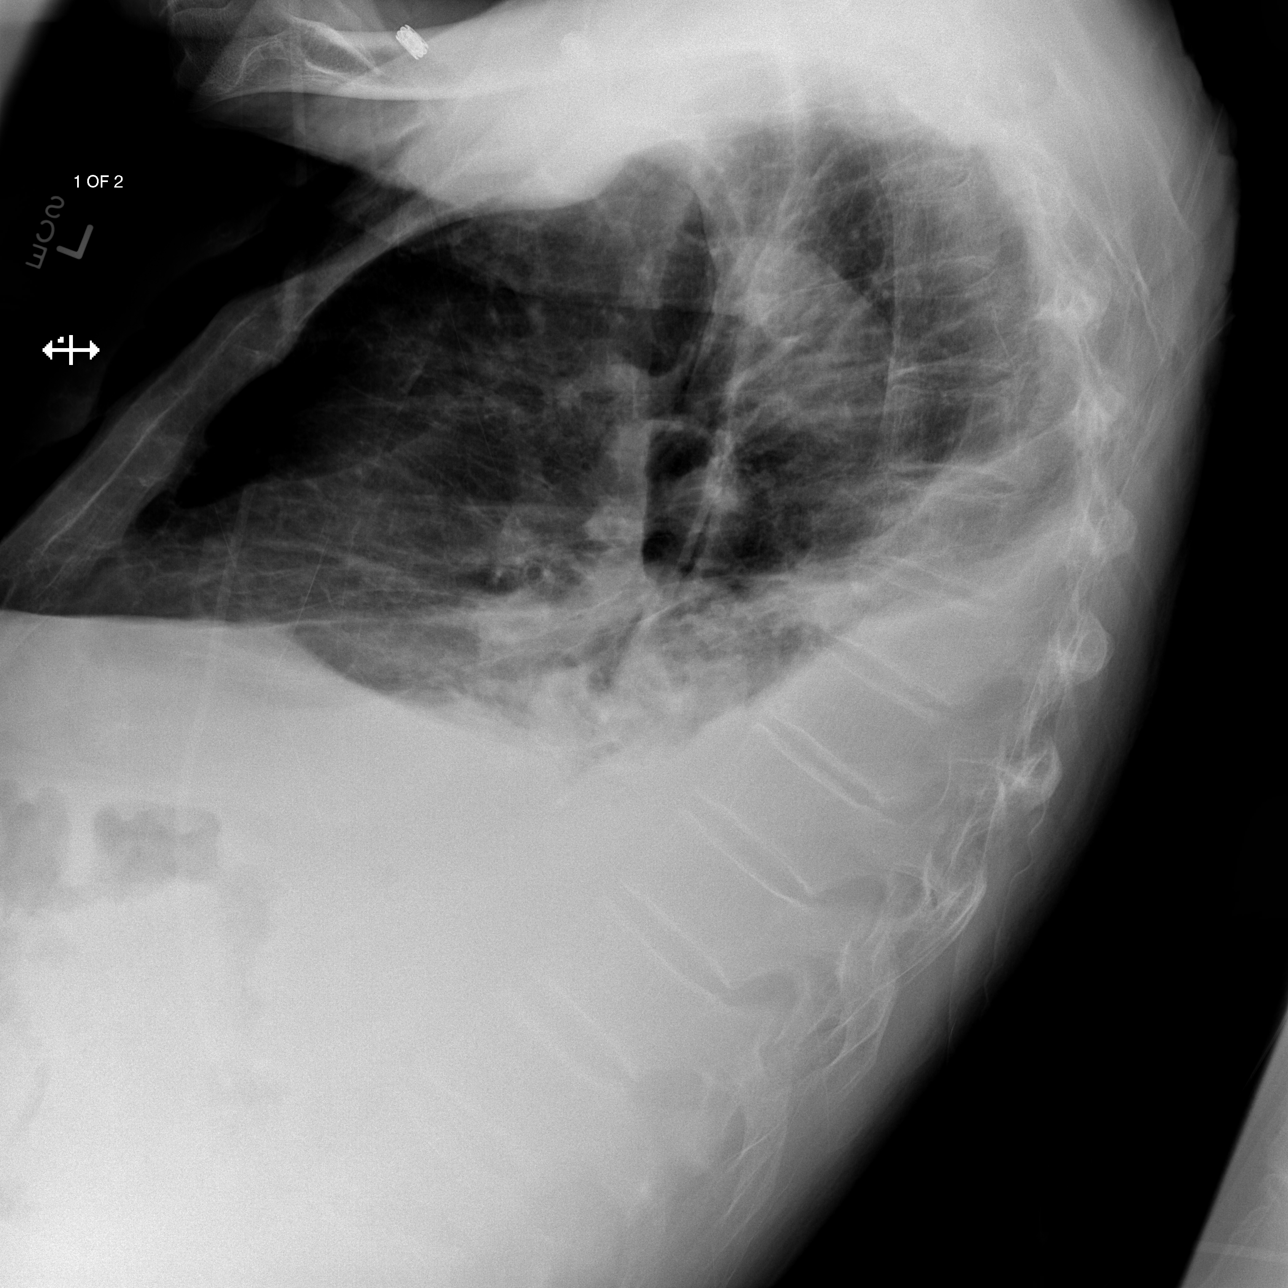
[im 3/3]
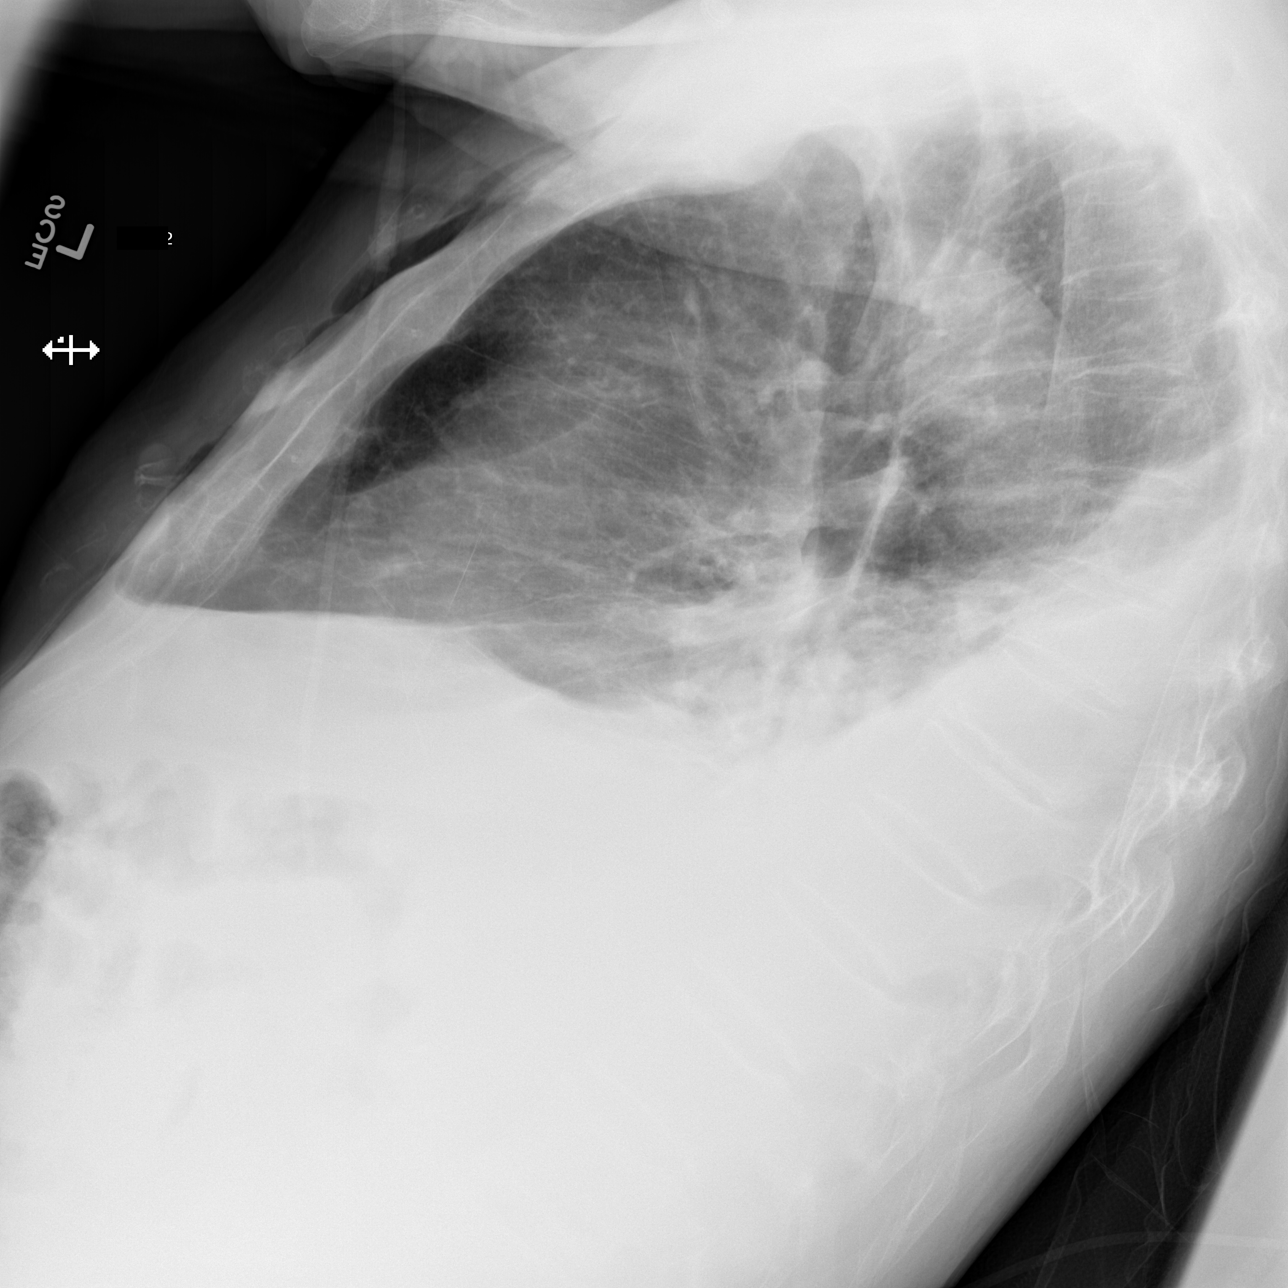

[3 of 3 positions shown; findings below may reference images not displayed]

FINDINGS: Cardiac shadow is stable. The lungs are well aerated but now
demonstrate patchy infiltrates in the bases bilaterally. These
changes are worse on the right than the left. Bilateral pleural
effusions are noted as well.
IMPRESSION: Bilateral lower lobe infiltrates with associated effusions

## 2016-01-03 DIAGNOSIS — Z85828 Personal history of other malignant neoplasm of skin: Secondary | ICD-10-CM | POA: Diagnosis not present

## 2016-02-19 ENCOUNTER — Inpatient Hospital Stay: Payer: Medicare Other | Attending: Hematology and Oncology

## 2016-02-19 ENCOUNTER — Inpatient Hospital Stay (HOSPITAL_BASED_OUTPATIENT_CLINIC_OR_DEPARTMENT_OTHER): Payer: Medicare Other | Admitting: Hematology and Oncology

## 2016-02-19 VITALS — BP 132/65 | HR 85 | Temp 97.7°F | Resp 18 | Wt 154.8 lb

## 2016-02-19 DIAGNOSIS — D098 Carcinoma in situ of other specified sites: Secondary | ICD-10-CM | POA: Insufficient documentation

## 2016-02-19 DIAGNOSIS — Z9221 Personal history of antineoplastic chemotherapy: Secondary | ICD-10-CM | POA: Diagnosis not present

## 2016-02-19 DIAGNOSIS — N189 Chronic kidney disease, unspecified: Secondary | ICD-10-CM | POA: Insufficient documentation

## 2016-02-19 DIAGNOSIS — D519 Vitamin B12 deficiency anemia, unspecified: Secondary | ICD-10-CM | POA: Insufficient documentation

## 2016-02-19 DIAGNOSIS — C9110 Chronic lymphocytic leukemia of B-cell type not having achieved remission: Secondary | ICD-10-CM

## 2016-02-19 DIAGNOSIS — C8300 Small cell B-cell lymphoma, unspecified site: Secondary | ICD-10-CM

## 2016-02-19 DIAGNOSIS — D869 Sarcoidosis, unspecified: Secondary | ICD-10-CM | POA: Insufficient documentation

## 2016-02-19 DIAGNOSIS — Z79899 Other long term (current) drug therapy: Secondary | ICD-10-CM

## 2016-02-19 DIAGNOSIS — Z7982 Long term (current) use of aspirin: Secondary | ICD-10-CM | POA: Insufficient documentation

## 2016-02-19 DIAGNOSIS — H905 Unspecified sensorineural hearing loss: Secondary | ICD-10-CM | POA: Insufficient documentation

## 2016-02-19 DIAGNOSIS — H8110 Benign paroxysmal vertigo, unspecified ear: Secondary | ICD-10-CM | POA: Diagnosis not present

## 2016-02-19 LAB — BASIC METABOLIC PANEL
Anion gap: 7 (ref 5–15)
BUN: 29 mg/dL — ABNORMAL HIGH (ref 6–20)
CO2: 27 mmol/L (ref 22–32)
Calcium: 9.2 mg/dL (ref 8.9–10.3)
Chloride: 103 mmol/L (ref 101–111)
Creatinine, Ser: 1.35 mg/dL — ABNORMAL HIGH (ref 0.61–1.24)
GFR calc Af Amer: 52 mL/min — ABNORMAL LOW (ref >60)
GFR calc non Af Amer: 45 mL/min — ABNORMAL LOW (ref >60)
Glucose, Bld: 106 mg/dL — ABNORMAL HIGH (ref 65–99)
Potassium: 4.6 mmol/L (ref 3.5–5.1)
Sodium: 137 mmol/L (ref 135–145)

## 2016-02-19 LAB — CBC WITH DIFFERENTIAL/PLATELET
Basophils Absolute: 0 10*3/uL (ref 0–0.1)
Basophils Relative: 0 %
Eosinophils Absolute: 0.1 10*3/uL (ref 0–0.7)
Eosinophils Relative: 2 %
HCT: 43 % (ref 40.0–52.0)
Hemoglobin: 14.8 g/dL (ref 13.0–18.0)
Lymphocytes Relative: 15 %
Lymphs Abs: 1.1 10*3/uL (ref 1.0–3.6)
MCH: 31.7 pg (ref 26.0–34.0)
MCHC: 34.3 g/dL (ref 32.0–36.0)
MCV: 92.4 fL (ref 80.0–100.0)
Monocytes Absolute: 0.5 10*3/uL (ref 0.2–1.0)
Monocytes Relative: 7 %
Neutro Abs: 5.5 10*3/uL (ref 1.4–6.5)
Neutrophils Relative %: 76 %
Platelets: 261 10*3/uL (ref 150–440)
RBC: 4.65 MIL/uL (ref 4.40–5.90)
RDW: 14.1 % (ref 11.5–14.5)
WBC: 7.3 10*3/uL (ref 3.8–10.6)

## 2016-02-19 LAB — LACTATE DEHYDROGENASE: LDH: 130 U/L (ref 98–192)

## 2016-02-19 NOTE — Progress Notes (Signed)
[facility] day:  02/19/2016  Chief Complaint: Harold Chavez is a 82 y.o. male with CLL who is seen for 6 month assessment.  HPI: The patient was last seen in the medical oncology clinic on 08/21/2015.  At that time, he was doing well.  He denied any B symptoms.  Exam was unremarkable.  Labs included a hematocrit of 42.5, hemoglobin 14.5, MCV 93.8, platelets 271,000, WBC 7300 with an ANC of 5000.  LDH was 135.  Uric acid was 6.5.  During the interim, he has done well.  He states that he received the influenza vaccine and "dodged the flu". He denies any adenopathy, B symptoms or infections.    Past Medical History  Diagnosis Date  . Benign paroxysmal positional vertigo   . History of shingles 2010  . Sarcoidosis ([place])   . Lentigo maligna of right cheek 2015    s/p Mohs ([place])  . B12 deficiency anemia   . 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 Leukemia ([place])     CLL  . Chronic kidney disease   . IgG gammopathy   . C. difficile diarrhea 07/14/2014    2015   . History of pneumonia 07/14/2014    2015   . Sensorineural hearing loss (SNHL) of both ears 10/2015    haering aide eval rec through [facility])  . Squamous cell carcinoma in situ of skin 2017    R forearm, nose - [place]    Past Surgical History  Procedure Laterality Date  . Testicle removal  1937    right; undescended  . Mohs surgery  2015    R cheek lentigo maligna in situ    Family History  Problem Relation Age of Onset  . Stroke Father     Social History:  reports that he has never smoked. He has never used smokeless tobacco. He reports that he does not drink alcohol or use illicit drugs.  The patient is accompanied by his son, Harold Chavez,  today.  Allergies: No Known Allergies  Current Medications: Current Outpatient Prescriptions  Medication Sig Dispense Refill  . aspirin 81 MG tablet Take 81 mg by mouth daily.    Marland Kitchen diltiazem  (CARDIZEM) 60 MG tablet Take 1 tablet (60 mg total) by mouth every other day. 90 tablet 1   No current facility-administered medications for this visit.    Review of Systems:  GENERAL:  Feels good.  No fevers or sweats.  Weight up 2 pounds. PERFORMANCE STATUS (ECOG):  1 HEENT:  No visual changes, runny nose, sore throat, mouth sores or tenderness. Lungs: No shortness of breath or cough.  No hemoptysis. Cardiac:  No chest pain, palpitations, orthopnea, or PND. GI:  No nausea, vomiting, diarrhea, constipation, melena or hematochezia. GU:  Urinary frequency.  No urgency, dysuria, or hematuria. Musculoskeletal:  Denies bone pain.  No joint pain.  No muscle tenderness. Extremities:  No pain or swelling. Skin:  No rashes or skin changes. Neuro:  No headache, numbness or weakness, balance or coordination issues. Endocrine:  No diabetes, thyroid issues, hot flashes or night sweats. Psych:  No mood changes, depression or anxiety. Pain:  No focal pain. Review of systems:  All other systems reviewed and found to be negative.  Physical Exam: Blood pressure 132/65, pulse 85, temperature 97.7 F (36.5 C), temperature source Oral, resp. rate 18, weight 154 lb 12.2 oz (70.2 kg). GENERAL:  Well developed, well nourished,  sitting comfortably in the exam room in no acute distress. MENTAL STATUS:  Alert and oriented to person, place and time. HEAD:  Short gray hair.  Normocephalic, atraumatic, face symmetric, no Cushingoid features. EYES:  Glasses.  Blue eyes.  Pupils equal round and reactive to light and accomodation.  No conjunctivitis or scleral icterus. ENT:  Oropharynx clear without lesion.  Tongue normal. Mucous membranes moist.  RESPIRATORY:  Clear to auscultation without rales, wheezes or rhonchi. CARDIOVASCULAR:  Regular rate and rhythm without murmur, rub or gallop. ABDOMEN:  Soft, non-tender, with active bowel sounds, and no hepatosplenomegaly.  No masses. SKIN:  Ecchymosis dorsum of right  hand.  No rashes, ulcers or lesions. EXTREMITIES: 4 cm cyst right elbow (old) with overlying ecchymosis.  No edema, no skin discoloration or tenderness.  No palpable cords. LYMPH NODES: No palpable cervical, supraclavicular, axillary or inguinal adenopathy  NEUROLOGICAL: Unremarkable. PSYCH:  Appropriate.  Appointment on 02/19/2016  Component Date Value Ref Range Status  . WBC 02/19/2016 7.3  3.8 - 10.6 K/uL Final  . RBC 02/19/2016 4.65  4.40 - 5.90 MIL/uL Final  . Hemoglobin 02/19/2016 14.8  13.0 - 18.0 g/dL Final  . HCT 02/19/2016 43.0  40.0 - 52.0 % Final  . MCV 02/19/2016 92.4  80.0 - 100.0 fL Final  . MCH 02/19/2016 31.7  26.0 - 34.0 pg Final  . MCHC 02/19/2016 34.3  32.0 - 36.0 g/dL Final  . RDW 02/19/2016 14.1  11.5 - 14.5 % Final  . Platelets 02/19/2016 261  150 - 440 K/uL Final  . Neutrophils Relative % 02/19/2016 76   Final  . Neutro Abs 02/19/2016 5.5  1.4 - 6.5 K/uL Final  . Lymphocytes Relative 02/19/2016 15   Final  . Lymphs Abs 02/19/2016 1.1  1.0 - 3.6 K/uL Final  . Monocytes Relative 02/19/2016 7   Final  . Monocytes Absolute 02/19/2016 0.5  0.2 - 1.0 K/uL Final  . Eosinophils Relative 02/19/2016 2   Final  . Eosinophils Absolute 02/19/2016 0.1  0 - 0.7 K/uL Final  . Basophils Relative 02/19/2016 0   Final  . Basophils Absolute 02/19/2016 0.0  0 - 0.1 K/uL Final  . Sodium 02/19/2016 137  135 - 145 mmol/L Final  . Potassium 02/19/2016 4.6  3.5 - 5.1 mmol/L Final  . Chloride 02/19/2016 103  101 - 111 mmol/L Final  . CO2 02/19/2016 27  22 - 32 mmol/L Final  . Glucose, Bld 02/19/2016 106* 65 - 99 mg/dL Final  . BUN 02/19/2016 29* 6 - 20 mg/dL Final  . Creatinine, Ser 02/19/2016 1.35* 0.61 - 1.24 mg/dL Final  . Calcium 02/19/2016 9.2  8.9 - 10.3 mg/dL Final  . GFR calc non Af Amer 02/19/2016 45* >60 mL/min Final  . GFR calc Af Amer 02/19/2016 52* >60 mL/min Final   Comment: (NOTE) The eGFR has been calculated using the CKD EPI equation. This calculation has not been  validated in all clinical situations. eGFR's persistently <60 mL/min signify possible Chronic Kidney Disease.   . Anion gap 02/19/2016 7  5 - 15 Final  . LDH 02/19/2016 130  98 - 192 U/L Final    Assessment:  Harold Chavez is a 82 y.o. male with chronic lymphocytic leukemia (CLL).  He presented in 10/2013 with dehydration and syncope.  Evaluation revealed axillary adenopathy.    He received 5 of 6 cycles of planned bendamustine and Rituxan (12/17/2013 - 04/27/2014).  With cycle #5 he developed neutropenia and received GCSF.  He  was subsequently admitted with sepsis and pneumonia.  Course was complicated by C diff.  Decision was made to defer his last cycle of chemotherapy.  He has a monoclonal increase of IgG of unclear significance.  IgG was 1101 on 08/19/2014.  Review of prior labs noted no M-spike, but kappa light chains on 12/01/2013.  At that time, kappa free light chains were 677.68 (high), lambda free light chains 453.8 (high), but a normal ratio (1.49).  Kappa free light chains were 34.64 (high), lambda free light chains 22.11 (high), with a normal ratio (1.57) on 11/17/2014.    Symptomatically, he is doing well.  He denies any B symptoms.  Exam reveals no adenopathy or hepatosplenomegaly.  Counts are stable.  Plan: 1. Labs today: CBC with diff, BMP, LDH, uric acid.  2. RTC in 6 months for MD assessment and labs (CBC, CMP, LDH).   Lequita Asal, MD  02/19/2016, 11:04 AM

## 2016-02-19 NOTE — Progress Notes (Signed)
States is here for 6 month checkup and is feeling well. Offers no complaints.

## 2016-02-20 ENCOUNTER — Encounter: Payer: Self-pay | Admitting: Hematology and Oncology

## 2016-04-05 ENCOUNTER — Ambulatory Visit (INDEPENDENT_AMBULATORY_CARE_PROVIDER_SITE_OTHER): Payer: Medicare Other | Admitting: Internal Medicine

## 2016-04-05 ENCOUNTER — Encounter: Payer: Self-pay | Admitting: Internal Medicine

## 2016-04-05 VITALS — BP 122/72 | HR 96 | Temp 98.7°F | Resp 18 | Wt 150.0 lb

## 2016-04-05 DIAGNOSIS — J989 Respiratory disorder, unspecified: Secondary | ICD-10-CM | POA: Diagnosis not present

## 2016-04-05 DIAGNOSIS — R509 Fever, unspecified: Principal | ICD-10-CM

## 2016-04-05 MED ORDER — DOXYCYCLINE HYCLATE 100 MG PO TABS: 100.0000 mg | ORAL_TABLET | Freq: Two times a day (BID) | ORAL | Status: DC

## 2016-04-05 NOTE — Progress Notes (Signed)
Subjective:    Patient ID: Harold Chavez, male    DOB: March 12, 1924, 82 y.o.   MRN: [id number]  HPI Here due to respiratory symptoms Here with son  Son had respiratory symptoms last week and passed it to him Sore throat started 4 days ago--not bad now Now down in chest Fever last 2 nights Coughing up mostly clear sputum Some head congestion No SOB Had sweat 2 nights ago (after feeling cool)  Has taken several OTC meds--- mucinex loosened stuff up, advil allergy/congestion may have helped also  Current Outpatient Prescriptions on File Prior to Visit  Medication Sig Dispense Refill  . aspirin 81 MG tablet Take 81 mg by mouth daily.    Marland Kitchen diltiazem (CARDIZEM) 60 MG tablet Take 1 tablet (60 mg total) by mouth every other day. 90 tablet 1   No current facility-administered medications on file prior to visit.    No Known Allergies  Past Medical History  Diagnosis Date  . Benign paroxysmal positional vertigo   . History of shingles 2010  . Sarcoidosis ([place])   . Lentigo maligna of right cheek 2015    s/p Mohs ([place])  . B12 deficiency anemia   . 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 Leukemia ([place])     CLL  . Chronic kidney disease   . IgG gammopathy   . C. difficile diarrhea 07/14/2014    2015   . History of pneumonia 07/14/2014    2015   . Sensorineural hearing loss (SNHL) of both ears 10/2015    haering aide eval rec through [facility])  . Squamous cell carcinoma in situ of skin 2017    R forearm, nose - [place]    Past Surgical History  Procedure Laterality Date  . Testicle removal  1937    right; undescended  . Mohs surgery  2015    R cheek lentigo maligna in situ    Family History  Problem Relation Age of Onset  . Stroke Father     Social History   Social History  . Marital Status: Married    Spouse Name: N/A  . Number of Children: N/A  . Years of Education: N/A   Occupational History  . Not on file.   Social  History Main Topics  . Smoking status: Never Smoker   . Smokeless tobacco: Never Used  . Alcohol Use: No  . Drug Use: No  . Sexual Activity: Not on file   Other Topics Concern  . 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 No distress.  HENT:  Mouth/Throat: Oropharynx is clear and moist. No oropharyngeal exudate.  No sinus tenderness TMs fine Mild nasal congestion with some opaque mucus  Neck: Normal range of motion. Neck supple. No thyromegaly present.  Pulmonary/Chest: Effort normal and breath sounds normal. No respiratory distress. He has no wheezes. He has no rales.  Lymphadenopathy:    He has no cervical adenopathy.          Assessment & Plan:

## 2016-04-05 NOTE — Progress Notes (Signed)
Pre visit review using our clinic review tool, if applicable. No additional management support is needed unless otherwise documented below in the visit note. 

## 2016-04-05 NOTE — Patient Instructions (Signed)
Start the antibiotic if you get worse over the next few days.

## 2016-04-05 NOTE — Assessment & Plan Note (Signed)
Probably viral Looks fine and no worrisome findings Has CLL though Discussed supportive care If worsens, will start doxy

## 2016-04-08 DIAGNOSIS — L814 Other melanin hyperpigmentation: Secondary | ICD-10-CM | POA: Diagnosis not present

## 2016-04-08 DIAGNOSIS — L821 Other seborrheic keratosis: Secondary | ICD-10-CM | POA: Diagnosis not present

## 2016-04-08 DIAGNOSIS — C4441 Basal cell carcinoma of skin of scalp and neck: Secondary | ICD-10-CM | POA: Diagnosis not present

## 2016-04-08 DIAGNOSIS — C44319 Basal cell carcinoma of skin of other parts of face: Secondary | ICD-10-CM | POA: Diagnosis not present

## 2016-04-08 DIAGNOSIS — Z85820 Personal history of malignant melanoma of skin: Secondary | ICD-10-CM | POA: Diagnosis not present

## 2016-04-08 DIAGNOSIS — D235 Other benign neoplasm of skin of trunk: Secondary | ICD-10-CM | POA: Diagnosis not present

## 2016-04-08 DIAGNOSIS — L570 Actinic keratosis: Secondary | ICD-10-CM | POA: Diagnosis not present

## 2016-04-11 ENCOUNTER — Encounter: Payer: Self-pay | Admitting: Family Medicine

## 2016-05-20 DIAGNOSIS — Z85828 Personal history of other malignant neoplasm of skin: Secondary | ICD-10-CM | POA: Diagnosis not present

## 2016-05-20 DIAGNOSIS — L570 Actinic keratosis: Secondary | ICD-10-CM | POA: Diagnosis not present

## 2016-07-12 DIAGNOSIS — L570 Actinic keratosis: Secondary | ICD-10-CM | POA: Diagnosis not present

## 2016-07-12 DIAGNOSIS — C44329 Squamous cell carcinoma of skin of other parts of face: Secondary | ICD-10-CM | POA: Diagnosis not present

## 2016-07-12 DIAGNOSIS — D485 Neoplasm of uncertain behavior of skin: Secondary | ICD-10-CM | POA: Diagnosis not present

## 2016-07-29 ENCOUNTER — Encounter: Payer: Self-pay | Admitting: Family Medicine

## 2016-08-08 DIAGNOSIS — Z23 Encounter for immunization: Secondary | ICD-10-CM | POA: Diagnosis not present

## 2016-08-19 DIAGNOSIS — Z85820 Personal history of malignant melanoma of skin: Secondary | ICD-10-CM | POA: Diagnosis not present

## 2016-08-19 DIAGNOSIS — Z85828 Personal history of other malignant neoplasm of skin: Secondary | ICD-10-CM | POA: Diagnosis not present

## 2016-08-25 NOTE — Progress Notes (Signed)
[facility] day:  08/26/16  Chief Complaint: Harold Chavez is a 82 y.o. male with CLL who is seen for 6 month assessment.  HPI: The patient was last seen in the medical oncology clinic on 02/19/2016.  At that time, he was doing well.  He denied any B symptoms.  Exam was unremarkable.  Labs included a hematocrit of 43.0, hemoglobin 14.8, MCV 92.4, platelets 261,000, WBC 7300 with an ANC of 5500.  LDH was 130.   During the interim, he has felt good.  He denies any B symptoms.  He notes nocturia (no change).   Past Medical History:  Diagnosis Date  . B12 deficiency anemia   . BCC (basal cell carcinoma of skin) 2017   L frontal scalp  . Benign paroxysmal positional vertigo   . C. difficile diarrhea 07/14/2014   2015   . Chronic kidney disease   . History of pneumonia 07/14/2014   2015   . History of shingles 2010  . IgG gammopathy   . Lentigo maligna of right cheek ([place]) 2015   s/p Mohs ([place])  . Leukemia ([place])    CLL  . 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 Sarcoidosis ([place])   . Sensorineural hearing loss (SNHL) of both ears 10/2015   haering aide eval rec through [facility])  . Squamous cell carcinoma in situ of skin 2017   R forearm, nose, R cheek - [place]    Past Surgical History:  Procedure Laterality Date  . MOHS SURGERY  2015   R cheek lentigo maligna in situ  . TESTICLE REMOVAL  1937   right; undescended    Family History  Problem Relation Age of Onset  . Stroke Father     Social History:  reports that he has never smoked. He has never used smokeless tobacco. He reports that he does not drink alcohol or use drugs.  The patient is accompanied by his son, [name],  today.  Allergies: No Known Allergies  Current Medications: Current Outpatient Prescriptions  Medication Sig Dispense Refill  . aspirin 81 MG tablet Take 81 mg by mouth daily.    Marland Kitchen diltiazem (CARDIZEM) 60 MG tablet Take 1  tablet (60 mg total) by mouth every other day. 90 tablet 1  . doxycycline (VIBRA-TABS) 100 MG tablet Take 1 tablet (100 mg total) by mouth 2 (two) times daily. (Patient not taking: Reported on 08/26/2016) 14 tablet 0   No current facility-administered medications for this visit.     Review of Systems:  GENERAL:  Feels good.  No fevers or sweats.  Weight down 8 pounds. PERFORMANCE STATUS (ECOG):  1 HEENT:  No visual changes, runny nose, sore throat, mouth sores or tenderness. Lungs: No shortness of breath or cough.  No hemoptysis. Cardiac:  No chest pain, palpitations, orthopnea, or PND. GI:  No nausea, vomiting, diarrhea, constipation, melena or hematochezia. GU:  Nocturia (no change).  No urgency, dysuria, or hematuria. Musculoskeletal:  Denies bone pain.  No joint pain.  No muscle tenderness. Extremities:  No pain or swelling. Skin:  No rashes or skin changes. Neuro:  No headache, numbness or weakness, balance or coordination issues. Endocrine:  No diabetes, thyroid issues, hot flashes or night sweats. Psych:  No mood changes, depression or anxiety. Pain:  No focal pain. Review of systems:  All other systems reviewed and found to be negative.  Physical Exam: Blood pressure 132/81, pulse 82, temperature (!)  96.3 F (35.7 C), temperature source Tympanic, resp. rate 18, weight 146 lb 6.2 oz (66.4 kg). GENERAL:  Thin elderly gentleman sitting comfortably in the exam room in no acute distress. MENTAL STATUS:  Alert and oriented to person, place and time. HEAD:  Short gray hair.  Normocephalic, atraumatic, face symmetric, no Cushingoid features. EYES:  Glasses.  Blue eyes.  Pupils equal round and reactive to light and accomodation.  No conjunctivitis or scleral icterus. ENT:  Oropharynx clear without lesion.  Tongue normal. Mucous membranes moist.  RESPIRATORY:  Clear to auscultation without rales, wheezes or rhonchi. CARDIOVASCULAR:  Regular rate and rhythm without murmur, rub or  gallop. ABDOMEN:  Soft, non-tender, with active bowel sounds, and no hepatosplenomegaly.  No masses. SKIN:  Ecchymosis dorsum of right hand.  No rashes, ulcers or lesions. EXTREMITIES: 4-5 cm cyst right elbow cyst (old).  No edema, no skin discoloration or tenderness.  No palpable cords. LYMPH NODES: No palpable cervical, supraclavicular, axillary or inguinal adenopathy  NEUROLOGICAL: Unremarkable. PSYCH:  Appropriate.   Appointment on 08/26/2016  Component Date Value Ref Range Status  . WBC 08/26/2016 8.4  3.8 - 10.6 K/uL Final  . RBC 08/26/2016 4.60  4.40 - 5.90 MIL/uL Final  . Hemoglobin 08/26/2016 14.6  13.0 - 18.0 g/dL Final  . HCT 08/26/2016 42.6  40.0 - 52.0 % Final  . MCV 08/26/2016 92.6  80.0 - 100.0 fL Final  . MCH 08/26/2016 31.8  26.0 - 34.0 pg Final  . MCHC 08/26/2016 34.3  32.0 - 36.0 g/dL Final  . RDW 08/26/2016 13.8  11.5 - 14.5 % Final  . Platelets 08/26/2016 274  150 - 440 K/uL Final  . Neutrophils Relative % 08/26/2016 71  % Final  . Neutro Abs 08/26/2016 6.0  1.4 - 6.5 K/uL Final  . Lymphocytes Relative 08/26/2016 20  % Final  . Lymphs Abs 08/26/2016 1.6  1.0 - 3.6 K/uL Final  . Monocytes Relative 08/26/2016 6  % Final  . Monocytes Absolute 08/26/2016 0.5  0.2 - 1.0 K/uL Final  . Eosinophils Relative 08/26/2016 2  % Final  . Eosinophils Absolute 08/26/2016 0.1  0 - 0.7 K/uL Final  . Basophils Relative 08/26/2016 1  % Final  . Basophils Absolute 08/26/2016 0.1  0 - 0.1 K/uL Final  . Sodium 08/26/2016 139  135 - 145 mmol/L Final  . Potassium 08/26/2016 5.1  3.5 - 5.1 mmol/L Final  . Chloride 08/26/2016 102  101 - 111 mmol/L Final  . CO2 08/26/2016 28  22 - 32 mmol/L Final  . Glucose, Bld 08/26/2016 104* 65 - 99 mg/dL Final  . BUN 08/26/2016 26* 6 - 20 mg/dL Final  . Creatinine, Ser 08/26/2016 1.36* 0.61 - 1.24 mg/dL Final  . Calcium 08/26/2016 9.3  8.9 - 10.3 mg/dL Final  . Total Protein 08/26/2016 7.9  6.5 - 8.1 g/dL Final  . Albumin 08/26/2016 4.6  3.5 - 5.0  g/dL Final  . AST 08/26/2016 20  15 - 41 U/L Final  . ALT 08/26/2016 13* 17 - 63 U/L Final  . Alkaline Phosphatase 08/26/2016 65  38 - 126 U/L Final  . Total Bilirubin 08/26/2016 1.0  0.3 - 1.2 mg/dL Final  . GFR calc non Af Amer 08/26/2016 44* >60 mL/min Final  . GFR calc Af Amer 08/26/2016 51* >60 mL/min Final   Comment: (NOTE) The eGFR has been calculated using the CKD EPI equation. This calculation has not been validated in all clinical situations. eGFR's persistently <60 mL/min  signify possible Chronic Kidney Disease.   . Anion gap 08/26/2016 9  5 - 15 Final  . LDH 08/26/2016 124  98 - 192 U/L Final    Assessment:  ARIANA CAVENAUGH is a 82 y.o. male with chronic lymphocytic leukemia (CLL).  He presented in 10/2013 with dehydration and syncope.  Evaluation revealed axillary adenopathy.    He received 5 of 6 cycles of planned bendamustine and Rituxan (12/17/2013 - 04/27/2014).  With cycle #5 he developed neutropenia and received GCSF.  He was subsequently admitted with sepsis and pneumonia.  Course was complicated by C diff.  Decision was made to defer his last cycle of chemotherapy.  He has a monoclonal gammopathy of unclear significance.  IgG was 1101 on 08/19/2014.  Review of prior labs noted no M-spike, but elevated kappa light chains on 12/01/2013.  Kappa free light chains were 677.68 (high), lambda free light chains 453.8 (high), and ratio 1.49 (normal) on 12/01/2013.  Kappa free light chains were 34.64 (high), lambda free light chains 22.11 (high), with a ratio 1.57 (normal) on 11/17/2014.    Symptomatically, he is doing well.  He denies any B symptoms.  Exam reveals no adenopathy or hepatosplenomegaly.  Counts are stable.  Plan: 1.  Labs today: CBC with diff, BMP, LDH.  2.  RTC in 6 months for MD assessment and labs (CBC, CMP, LDH).   Lequita Asal, MD  08/26/2016, 10:16 AM

## 2016-08-26 ENCOUNTER — Inpatient Hospital Stay: Payer: Medicare Other | Attending: Hematology and Oncology

## 2016-08-26 ENCOUNTER — Inpatient Hospital Stay (HOSPITAL_BASED_OUTPATIENT_CLINIC_OR_DEPARTMENT_OTHER): Payer: Medicare Other | Admitting: Hematology and Oncology

## 2016-08-26 ENCOUNTER — Encounter: Payer: Self-pay | Admitting: Hematology and Oncology

## 2016-08-26 VITALS — BP 132/81 | HR 82 | Temp 96.3°F | Resp 18 | Wt 146.4 lb

## 2016-08-26 DIAGNOSIS — D519 Vitamin B12 deficiency anemia, unspecified: Secondary | ICD-10-CM | POA: Insufficient documentation

## 2016-08-26 DIAGNOSIS — D869 Sarcoidosis, unspecified: Secondary | ICD-10-CM | POA: Diagnosis not present

## 2016-08-26 DIAGNOSIS — C9110 Chronic lymphocytic leukemia of B-cell type not having achieved remission: Secondary | ICD-10-CM

## 2016-08-26 DIAGNOSIS — D472 Monoclonal gammopathy: Secondary | ICD-10-CM

## 2016-08-26 DIAGNOSIS — Z7982 Long term (current) use of aspirin: Secondary | ICD-10-CM | POA: Insufficient documentation

## 2016-08-26 DIAGNOSIS — Z79899 Other long term (current) drug therapy: Secondary | ICD-10-CM | POA: Diagnosis not present

## 2016-08-26 DIAGNOSIS — H905 Unspecified sensorineural hearing loss: Secondary | ICD-10-CM | POA: Insufficient documentation

## 2016-08-26 DIAGNOSIS — N189 Chronic kidney disease, unspecified: Secondary | ICD-10-CM | POA: Diagnosis not present

## 2016-08-26 DIAGNOSIS — Z85828 Personal history of other malignant neoplasm of skin: Secondary | ICD-10-CM | POA: Insufficient documentation

## 2016-08-26 DIAGNOSIS — H8110 Benign paroxysmal vertigo, unspecified ear: Secondary | ICD-10-CM | POA: Diagnosis not present

## 2016-08-26 LAB — COMPREHENSIVE METABOLIC PANEL
ALT: 13 U/L — ABNORMAL LOW (ref 17–63)
AST: 20 U/L (ref 15–41)
Albumin: 4.6 g/dL (ref 3.5–5.0)
Alkaline Phosphatase: 65 U/L (ref 38–126)
Anion gap: 9 (ref 5–15)
BUN: 26 mg/dL — ABNORMAL HIGH (ref 6–20)
CO2: 28 mmol/L (ref 22–32)
Calcium: 9.3 mg/dL (ref 8.9–10.3)
Chloride: 102 mmol/L (ref 101–111)
Creatinine, Ser: 1.36 mg/dL — ABNORMAL HIGH (ref 0.61–1.24)
GFR calc Af Amer: 51 mL/min — ABNORMAL LOW (ref >60)
GFR calc non Af Amer: 44 mL/min — ABNORMAL LOW (ref >60)
Glucose, Bld: 104 mg/dL — ABNORMAL HIGH (ref 65–99)
Potassium: 5.1 mmol/L (ref 3.5–5.1)
Sodium: 139 mmol/L (ref 135–145)
Total Bilirubin: 1 mg/dL (ref 0.3–1.2)
Total Protein: 7.9 g/dL (ref 6.5–8.1)

## 2016-08-26 LAB — CBC WITH DIFFERENTIAL/PLATELET
Basophils Absolute: 0.1 10*3/uL (ref 0–0.1)
Basophils Relative: 1 %
Eosinophils Absolute: 0.1 10*3/uL (ref 0–0.7)
Eosinophils Relative: 2 %
HCT: 42.6 % (ref 40.0–52.0)
Hemoglobin: 14.6 g/dL (ref 13.0–18.0)
Lymphocytes Relative: 20 %
Lymphs Abs: 1.6 10*3/uL (ref 1.0–3.6)
MCH: 31.8 pg (ref 26.0–34.0)
MCHC: 34.3 g/dL (ref 32.0–36.0)
MCV: 92.6 fL (ref 80.0–100.0)
Monocytes Absolute: 0.5 10*3/uL (ref 0.2–1.0)
Monocytes Relative: 6 %
Neutro Abs: 6 10*3/uL (ref 1.4–6.5)
Neutrophils Relative %: 71 %
Platelets: 274 10*3/uL (ref 150–440)
RBC: 4.6 MIL/uL (ref 4.40–5.90)
RDW: 13.8 % (ref 11.5–14.5)
WBC: 8.4 10*3/uL (ref 3.8–10.6)

## 2016-08-26 LAB — LACTATE DEHYDROGENASE: LDH: 124 U/L (ref 98–192)

## 2016-08-26 NOTE — Progress Notes (Signed)
Patient offers no complaints today. 

## 2016-09-03 DIAGNOSIS — D0439 Carcinoma in situ of skin of other parts of face: Secondary | ICD-10-CM | POA: Diagnosis not present

## 2016-09-03 DIAGNOSIS — L570 Actinic keratosis: Secondary | ICD-10-CM | POA: Diagnosis not present

## 2016-09-21 ENCOUNTER — Emergency Department
Admission: EM | Admit: 2016-09-21 | Discharge: 2016-09-21 | Disposition: A | Discharge status: Home / Self Care | Payer: Medicare Other | Attending: Emergency Medicine | Admitting: Emergency Medicine

## 2016-09-21 ENCOUNTER — Emergency Department: Payer: Medicare Other

## 2016-09-21 ENCOUNTER — Encounter: Payer: Self-pay | Admitting: Medical Oncology

## 2016-09-21 DIAGNOSIS — W1839XA Other fall on same level, initial encounter: Secondary | ICD-10-CM | POA: Insufficient documentation

## 2016-09-21 DIAGNOSIS — S0990XA Unspecified injury of head, initial encounter: Secondary | ICD-10-CM | POA: Diagnosis not present

## 2016-09-21 DIAGNOSIS — E1122 Type 2 diabetes mellitus with diabetic chronic kidney disease: Secondary | ICD-10-CM | POA: Diagnosis not present

## 2016-09-21 DIAGNOSIS — Y999 Unspecified external cause status: Secondary | ICD-10-CM | POA: Insufficient documentation

## 2016-09-21 DIAGNOSIS — R000 Tachycardia, unspecified: Secondary | ICD-10-CM | POA: Diagnosis not present

## 2016-09-21 DIAGNOSIS — N390 Urinary tract infection, site not specified: Secondary | ICD-10-CM | POA: Diagnosis not present

## 2016-09-21 DIAGNOSIS — R55 Syncope and collapse: Secondary | ICD-10-CM | POA: Diagnosis not present

## 2016-09-21 DIAGNOSIS — Z7982 Long term (current) use of aspirin: Secondary | ICD-10-CM | POA: Diagnosis not present

## 2016-09-21 DIAGNOSIS — Y929 Unspecified place or not applicable: Secondary | ICD-10-CM | POA: Diagnosis not present

## 2016-09-21 DIAGNOSIS — R42 Dizziness and giddiness: Secondary | ICD-10-CM | POA: Insufficient documentation

## 2016-09-21 DIAGNOSIS — W19XXXA Unspecified fall, initial encounter: Secondary | ICD-10-CM | POA: Diagnosis not present

## 2016-09-21 DIAGNOSIS — N183 Chronic kidney disease, stage 3 (moderate): Secondary | ICD-10-CM | POA: Diagnosis not present

## 2016-09-21 DIAGNOSIS — S20419A Abrasion of unspecified back wall of thorax, initial encounter: Secondary | ICD-10-CM | POA: Insufficient documentation

## 2016-09-21 DIAGNOSIS — Y939 Activity, unspecified: Secondary | ICD-10-CM | POA: Insufficient documentation

## 2016-09-21 LAB — URINALYSIS, COMPLETE (UACMP) WITH MICROSCOPIC
Bacteria, UA: NONE SEEN
Bilirubin Urine: NEGATIVE
GLUCOSE, UA: NEGATIVE mg/dL
Ketones, ur: NEGATIVE mg/dL
NITRITE: NEGATIVE
PH: 6 (ref 5.0–8.0)
Protein, ur: NEGATIVE mg/dL
SQUAMOUS EPITHELIAL / LPF: NONE SEEN
Specific Gravity, Urine: 1.012 (ref 1.005–1.030)

## 2016-09-21 LAB — CBC
HEMATOCRIT: 41.6 % (ref 40.0–52.0)
HEMOGLOBIN: 14.5 g/dL (ref 13.0–18.0)
MCH: 32.1 pg (ref 26.0–34.0)
MCHC: 34.9 g/dL (ref 32.0–36.0)
MCV: 91.9 fL (ref 80.0–100.0)
Platelets: 258 10*3/uL (ref 150–440)
RBC: 4.53 MIL/uL (ref 4.40–5.90)
RDW: 13.6 % (ref 11.5–14.5)
WBC: 12.4 10*3/uL — ABNORMAL HIGH (ref 3.8–10.6)

## 2016-09-21 LAB — TROPONIN I: Troponin I: <0.03 ng/mL (ref <0.03)

## 2016-09-21 LAB — BASIC METABOLIC PANEL
ANION GAP: 8 (ref 5–15)
BUN: 24 mg/dL — ABNORMAL HIGH (ref 6–20)
CALCIUM: 9.3 mg/dL (ref 8.9–10.3)
CO2: 27 mmol/L (ref 22–32)
Chloride: 105 mmol/L (ref 101–111)
Creatinine, Ser: 1.51 mg/dL — ABNORMAL HIGH (ref 0.61–1.24)
GFR, EST AFRICAN AMERICAN: 45 mL/min — AB (ref >60)
GFR, EST NON AFRICAN AMERICAN: 39 mL/min — AB (ref >60)
GLUCOSE: 105 mg/dL — AB (ref 65–99)
POTASSIUM: 4.6 mmol/L (ref 3.5–5.1)
Sodium: 140 mmol/L (ref 135–145)

## 2016-09-21 MED ORDER — CEFTRIAXONE SODIUM-DEXTROSE 1-3.74 GM-% IV SOLR
1.0000 g | Freq: Once | INTRAVENOUS | Status: AC
  Administered 2016-09-21: 1 g via INTRAVENOUS
  Filled 2016-09-21: qty 50

## 2016-09-21 MED ORDER — CEFTRIAXONE SODIUM 1 G IJ SOLR: 1.0000 g | Freq: Once | INTRAMUSCULAR | Status: DC

## 2016-09-21 MED ORDER — CEPHALEXIN 500 MG PO CAPS: 500.0000 mg | ORAL_CAPSULE | Freq: Two times a day (BID) | ORAL | 0 refills | Status: DC

## 2016-09-21 MED ORDER — MECLIZINE HCL 25 MG PO TABS: 25.0000 mg | ORAL_TABLET | Freq: Three times a day (TID) | ORAL | 0 refills | Status: DC | PRN

## 2016-09-21 NOTE — ED Provider Notes (Signed)
[facility] Emergency Department Provider Note ____________________________________________   I have reviewed the triage vital signs and the triage nursing note.  HISTORY  Chief Complaint Dizziness   Historian Patient  HPI Harold Chavez is a 82 y.o. male who lives at home with his son, states that for the past several days, probably since Wednesday he's been a little bit off balance which is quite unusual for him because he is in excellent state of health. Thinking back he thinks that maybe at one point he was dehydrated and had similar symptoms. This morning it was a little worse he states that when he was walking he felt off balance and fell to the side and his back ran into the closet doors, and he has a small scratch, but no significant traumatic injury. No head injury.  No altered mental status. No recent illness in terms of cough or congestion or fever or vomiting or diarrhea. He has no complaints of focal weakness or numbness. Symptoms are mild to moderate.  In the past it sounds like he had an episode with racing heartbeat and he was told to take diltiazem every other day, but he has not been taking that in quite some time now. Today he did not report any palpitations, flushed feeling, headache, chest pain, or trouble breathing.    Past Medical History:  Diagnosis Date  . B12 deficiency anemia   . BCC (basal cell carcinoma of skin) 2017   L frontal scalp  . Benign paroxysmal positional vertigo   . C. difficile diarrhea 07/14/2014   2015   . Chronic kidney disease   . History of pneumonia 07/14/2014   2015   . History of shingles 2010  . IgG gammopathy   . 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 Leukemia ([place])    CLL  . 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 Sarcoidosis ([place])   . Sensorineural hearing loss (SNHL) of both ears 10/2015   haering aide eval rec through [facility])  . Squamous  cell carcinoma in situ of skin 2017   R forearm, nose, R cheek - [place]    Patient Active Problem List   Diagnosis Date Noted  . Febrile respiratory illness 04/05/2016  . Advanced care planning/counseling discussion 10/16/2015  . Medicare annual wellness visit, initial 10/16/2015  . Leukemia ([place])   . Sensorineural hearing loss (SNHL) of both ears 10/01/2015  . Tachycardia 04/14/2015  . Left shoulder pain 10/14/2014  . Urinary urgency 07/14/2014  . Protein-calorie malnutrition, mild ([place])   . Chronic lymphocytic leukemia ([place])   . IgG gammopathy 11/19/2013  . Generalized lymphadenopathy 11/10/2013  . 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 CKD stage 3 secondary to diabetes ([place]) 11/10/2013  . Lentigo maligna of right cheek ([place]) 11/10/2013  . BENIGN POSITIONAL VERTIGO 04/17/2009  . Sarcoidosis ([place]) 03/02/2007    Past Surgical History:  Procedure Laterality Date  . MOHS SURGERY  2015   R cheek lentigo maligna in situ  . TESTICLE REMOVAL  1937   right; undescended    Prior to Admission medications   Medication Sig Start Date End Date Taking? Authorizing Provider  aspirin 81 MG tablet Take 81 mg by mouth daily.   Yes Historical Provider, MD  Cyanocobalamin (B-12 PO) Take 1 tablet by mouth daily.   Yes Historical Provider, MD  diltiazem (CARDIZEM) 60 MG tablet Take 1 tablet (60 mg  total) by mouth every other day. 04/14/15  Yes Ria Bush, MD  cephALEXin (KEFLEX) 500 MG capsule Take 1 capsule (500 mg total) by mouth 2 (two) times daily. 09/21/16   Lisa Roca, MD  meclizine (ANTIVERT) 25 MG tablet Take 1 tablet (25 mg total) by mouth 3 (three) times daily as needed for dizziness or nausea. 09/21/16   Lisa Roca, MD    No Known Allergies  Family History  Problem Relation Age of Onset  . Stroke Father     Social History Social History  Substance Use Topics  . Smoking status: Never Smoker  . Smokeless tobacco: Never Used  . Alcohol use No     Review of Systems  Constitutional: Negative for fever. Eyes: Negative for visual changes. ENT: Negative for sore throat. Cardiovascular: Negative for chest pain. Respiratory: Negative for shortness of breath. Gastrointestinal: Negative for abdominal pain, vomiting and diarrhea. Genitourinary: Negative for dysuria. Musculoskeletal: Negative for back pain. Skin: Negative for rash. Neurological: Negative for headache. 10 point Review of Systems otherwise negative ____________________________________________   PHYSICAL EXAM:  VITAL SIGNS: ED Triage Vitals  Enc Vitals Group     BP 09/21/16 0924 (!) 132/96     Pulse Rate 09/21/16 0924 (!) 106     Resp 09/21/16 0924 16     Temp 09/21/16 0924 98.4 F (36.9 C)     Temp Source 09/21/16 0924 Oral     SpO2 09/21/16 0920 100 %     Weight 09/21/16 0924 152 lb (68.9 kg)     Height 09/21/16 0924 6' (1.829 m)     Head Circumference --      Peak Flow --      Pain Score --      Pain Loc --      Pain Edu? --      Excl. in [place]? --      Constitutional: Alert and oriented. Well appearing and in no distress. HEENT   Head: Normocephalic and atraumatic.      Eyes: Conjunctivae are normal. PERRL. Normal extraocular movements.      Ears:         Nose: No congestion/rhinnorhea.   Mouth/Throat: Mucous membranes are moist.   Neck: No stridor.  No posterior midline C-spine tenderness to palpation. Cardiovascular/Chest: Normal rate, regular rhythm.  No murmurs, rubs, or gallops. Respiratory: Normal respiratory effort without tachypnea nor retractions. Breath sounds are clear and equal bilaterally. No wheezes/rales/rhonchi. Gastrointestinal: Soft. No distention, no guarding, no rebound. Nontender.    Genitourinary/rectal:Deferred Musculoskeletal: Small scratch to the left upper back which is nontender. Pelvis stable and nontender. Nontender with normal range of motion in all extremities. No joint effusions.  No lower extremity  tenderness.  No edema. Neurologic:  Cranial nerves II through X intact. Normal speech and language. 5 out of 5 strength in 4 extremities. No sensory deficits.  Finger to nose intact bilaterally.  No pronator drift. Skin:  Skin is warm, dry and intact. No rash noted. Psychiatric: Mood and affect are normal. Speech and behavior are normal. Patient exhibits appropriate insight and judgment.   ____________________________________________  LABS (pertinent positives/negatives)  Labs Reviewed  BASIC METABOLIC PANEL - Abnormal; Notable for the following:       Result Value   Glucose, Bld 105 (*)    BUN 24 (*)    Creatinine, Ser 1.51 (*)    GFR calc non Af Amer 39 (*)    GFR calc Af Amer 45 (*)    All other  components within normal limits  CBC - Abnormal; Notable for the following:    WBC 12.4 (*)    All other components within normal limits  URINALYSIS, COMPLETE (UACMP) WITH MICROSCOPIC - Abnormal; Notable for the following:    Color, Urine YELLOW (*)    APPearance CLEAR (*)    Hgb urine dipstick SMALL (*)    Leukocytes, UA LARGE (*)    All other components within normal limits  URINE CULTURE  TROPONIN I    ____________________________________________    EKG I, Lisa Roca, MD, the attending physician have personally viewed and interpreted all ECGs.  99 bpm. Normal sinus rhythm. Left axis deviation. Premature atrial complexes. Nonspecific ST and T-wave  ____________________________________________  RADIOLOGY All Xrays were viewed by me. Imaging interpreted by Radiologist.  CT head without contrast:  IMPRESSION: No acute intracranial findings.  Mild chronic ischemic microvascular disease in minimal age related atrophic change. __________________________________________  PROCEDURES  Procedure(s) performed: None  Critical Care performed: None  ____________________________________________   ED COURSE / ASSESSMENT AND PLAN  Pertinent labs & imaging results that  were available during my care of the patient were reviewed by me and considered in my medical decision making (see chart for details).   Mr. Portman came in today with his son due to feeling off-balance which has been ongoing now for several days, 3 days, and had a worse episode today. No significant traumatic injury, just a small scratch at his left upper back.  Description of symptoms and his examination do not resolve suspicion for cardiac event at the root of the dizziness/off-balance feeling.  On neurologic exam, no focal neurologic deficits.  Cerebellar function normal, no ongoing symptoms.  His head CT is reassuring for no acute finding, and if symptoms were from a stroke, I suspect it would have shown up due to onset approximately 3 days ago at this point.  Symptoms seem less likely TIA, but I did discuss taking baby aspirin and follow up with his primary care doctor.  Metabolic evaluation with laboratory studies show very slightly increased creatinine, slightly elevated white blood cell count, and urinalysis shows leukocytes and white blood cells without nitrites or bacteria.  I think is possible that his symptoms are from a urinary tract infection and I will go ahead and treat with a dose of IV Rocephin, sent a culture, and discharged with Keflex. I discussed with them that this is a possibility, but I hate to wait several days of of delayed treatment when he is symptomatic.  Also possible at this point that his symptoms could be due to the vertigo, however he is not describing classic room spinning.  We discussed trying meclizine.     CONSULTATIONS:   None   Patient / Family / Caregiver informed of clinical course, medical decision-making process, and agree with plan.   I discussed return precautions, follow-up instructions, and discharge instructions with patient and/or family.   ___________________________________________   FINAL CLINICAL IMPRESSION(S) / ED  DIAGNOSES   Final diagnoses:  Dizziness  Urinary tract infection without hematuria, site unspecified  Vertigo              Note: This dictation was prepared with Dragon dictation. Any transcriptional errors that result from this process are unintentional    Lisa Roca, MD 09/21/16 1103

## 2016-09-21 NOTE — ED Notes (Signed)
Patient returned from CT patient placed back on monitor. Patient and family updated waiting on results. Patient denies pain.

## 2016-09-21 NOTE — Discharge Instructions (Signed)
You were evaluated for being dizzy and off balance, and although no certain cause was found, as we discussed, I suspect a urinary tract infection and you were given one dose of Rocephin antibiotic by IV in the ER.  Start  your Keflex antibiotic tomorrow.  We also discussed your symptoms are less likely to be a stroke given reassuring CT of the head and symptoms of several days now.  However, if you have continued symptoms, your primary doctor might consider MRI of the brain.  Your symptoms also might be due to vertigo - you may try meclizine for symptoms.  If you still have symptoms beyond 1 week, please make an appointment with ENT physician.  Return to the emergency department for any worsening symptoms including weakness, numbness, abdominal pain, inability to urinate, vomiting, fever, passing out, or any other symptoms concerning to you.

## 2016-09-21 NOTE — ED Triage Notes (Signed)
Pt from home via ems with reports that he stood up and felt off balance. Pt reports he fell back onto closet doors and scrapped left upper back. Pt denies pain. Denies hitting head, no use of blood thinner, no LOC. Pt A/O x 4. Pt reports that he feels "off balance" when he stands up for 2-3 days.

## 2016-09-22 LAB — URINE CULTURE: CULTURE: NO GROWTH

## 2016-09-26 ENCOUNTER — Encounter: Payer: Self-pay | Admitting: Family Medicine

## 2016-09-26 ENCOUNTER — Ambulatory Visit (INDEPENDENT_AMBULATORY_CARE_PROVIDER_SITE_OTHER): Payer: Medicare Other | Admitting: Family Medicine

## 2016-09-26 VITALS — BP 142/84 | HR 104 | Temp 97.6°F | Wt 146.2 lb

## 2016-09-26 DIAGNOSIS — C9110 Chronic lymphocytic leukemia of B-cell type not having achieved remission: Secondary | ICD-10-CM | POA: Diagnosis not present

## 2016-09-26 DIAGNOSIS — R000 Tachycardia, unspecified: Secondary | ICD-10-CM

## 2016-09-26 DIAGNOSIS — R42 Dizziness and giddiness: Secondary | ICD-10-CM

## 2016-09-26 MED ORDER — ATENOLOL 25 MG PO TABS: 25.0000 mg | ORAL_TABLET | Freq: Every day | ORAL | 6 refills | Status: DC

## 2016-09-26 NOTE — Assessment & Plan Note (Signed)
Appreciate onc care of patient. 

## 2016-09-26 NOTE — Progress Notes (Signed)
BP (!) 142/84   Pulse (!) 104   Temp 97.6 F (36.4 C) (Oral)   Wt 146 lb 4 oz (66.3 kg)   BMI 19.84 kg/m    CC: ER f/u visit Subjective:    Patient ID: Harold Chavez, male    DOB: March 22, 1924, 82 y.o.   MRN: [id number]  HPI: Harold Chavez is a 82 y.o. male presenting on 09/26/2016 for Follow-up (hospital)   Here with son today. Recent ER visit 09/21/2016 for dizziness x3 days with fall and dehydration. Much more unsteady with gait. EMS Called, s/p eval at [facility] ER. Records reviewed. Cr 1.51, WBC 12.4. Head CT without acute findings. Recommended start baby aspirin and f/u with PCP. UA with possible UTI - started on keflex and treated with Rocephin shot. Urine culture returned without growth. Remained unsteady for 3 days then slowly returning to normal.   Using cane regularly.   Denies dysuria, urgency, hematuria, or fever/chills. Denies appetite changes, tremors, memory trouble. Denies significant vertigo sensation or nausea or any pain.   He did stop diltiazem 1 month ago due to ankle edema. This was started after persistent tachycardia several years ago.   Relevant past medical, surgical, family and social history reviewed and updated as indicated. Interim medical history since our last visit reviewed. Allergies and medications reviewed and updated. Current Outpatient Prescriptions on File Prior to Visit  Medication Sig  . cephALEXin (KEFLEX) 500 MG capsule Take 1 capsule (500 mg total) by mouth 2 (two) times daily.  . Cyanocobalamin (B-12 PO) Take 1 tablet by mouth daily.  . meclizine (ANTIVERT) 25 MG tablet Take 1 tablet (25 mg total) by mouth 3 (three) times daily as needed for dizziness or nausea.  Marland Kitchen aspirin 81 MG tablet Take 81 mg by mouth daily.   No current facility-administered medications on file prior to visit.     Review of Systems Per HPI unless specifically indicated in ROS section     Objective:    BP (!) 142/84   Pulse (!) 104   Temp 97.6 F (36.4 C)  (Oral)   Wt 146 lb 4 oz (66.3 kg)   BMI 19.84 kg/m   Wt Readings from Last 3 Encounters:  09/26/16 146 lb 4 oz (66.3 kg)  09/21/16 152 lb (68.9 kg)  08/26/16 146 lb 6.2 oz (66.4 kg)    Physical Exam  Constitutional: He appears well-developed and well-nourished. No distress.  HENT:  Mouth/Throat: Oropharynx is clear and moist. No oropharyngeal exudate.  Cardiovascular: Regular rhythm, normal heart sounds and intact distal pulses.  Tachycardia present.   No murmur heard. Pulmonary/Chest: Effort normal and breath sounds normal. No respiratory distress. He has no wheezes. He has no rales.  Musculoskeletal: He exhibits no edema.  Skin: Skin is warm. No rash noted. No erythema.  Psychiatric: He has a normal mood and affect.  Nursing note and vitals reviewed.  Results for orders placed or performed during the hospital encounter of 09/21/16  Urine culture  Result Value Ref Range   Specimen Description URINE, RANDOM    Special Requests NONE    Culture NO GROWTH Performed at [facility]     Report Status 09/22/2016 FINAL   Basic metabolic panel  Result Value Ref Range   Sodium 140 135 - 145 mmol/L   Potassium 4.6 3.5 - 5.1 mmol/L   Chloride 105 101 - 111 mmol/L   CO2 27 22 - 32 mmol/L   Glucose, Bld 105 (H) 65 -  99 mg/dL   BUN 24 (H) 6 - 20 mg/dL   Creatinine, Ser 1.51 (H) 0.61 - 1.24 mg/dL   Calcium 9.3 8.9 - 10.3 mg/dL   GFR calc non Af Amer 39 (L) >60 mL/min   GFR calc Af Amer 45 (L) >60 mL/min   Anion gap 8 5 - 15  CBC  Result Value Ref Range   WBC 12.4 (H) 3.8 - 10.6 K/uL   RBC 4.53 4.40 - 5.90 MIL/uL   Hemoglobin 14.5 13.0 - 18.0 g/dL   HCT 41.6 40.0 - 52.0 %   MCV 91.9 80.0 - 100.0 fL   MCH 32.1 26.0 - 34.0 pg   MCHC 34.9 32.0 - 36.0 g/dL   RDW 13.6 11.5 - 14.5 %   Platelets 258 150 - 440 K/uL  Urinalysis, Complete w Microscopic  Result Value Ref Range   Color, Urine YELLOW (A) YELLOW   APPearance CLEAR (A) CLEAR   Specific Gravity, Urine 1.012 1.005 -  1.030   pH 6.0 5.0 - 8.0   Glucose, UA NEGATIVE NEGATIVE mg/dL   Hgb urine dipstick SMALL (A) NEGATIVE   Bilirubin Urine NEGATIVE NEGATIVE   Ketones, ur NEGATIVE NEGATIVE mg/dL   Protein, ur NEGATIVE NEGATIVE mg/dL   Nitrite NEGATIVE NEGATIVE   Leukocytes, UA LARGE (A) NEGATIVE   RBC / HPF 0-5 0 - 5 RBC/hpf   WBC, UA TOO NUMEROUS TO COUNT 0 - 5 WBC/hpf   Bacteria, UA NONE SEEN NONE SEEN   Squamous Epithelial / LPF NONE SEEN NONE SEEN  Troponin I  Result Value Ref Range   Troponin I <0.03 <0.03 ng/mL      Assessment & Plan:   Problem List Items Addressed This Visit    Chronic lymphocytic leukemia ([place])    Appreciate onc care of patient.      Relevant Medications   aspirin 325 MG tablet   Dizziness - Primary    Unsteadiness that is resolving in setting of stopping dilt for tachycardia over the past month. Anticipate related to this. Reassuring evaluation at ER this past weekend. As UCx returned normal, rec stop abx. Discussed hydration status, will start atenolol and reassess next month at medicare wellness visit.       Tachycardia    Worse off dilt - will start low dose atenolol 25mg  daily.  ?etiology of recent unsteadiness. Check EKG if persistent next month at CPE.           Follow up plan: Return if symptoms worsen or fail to improve.  Ria Bush, MD

## 2016-09-26 NOTE — Assessment & Plan Note (Addendum)
Worse off dilt - will start low dose atenolol 25mg  daily.  ?etiology of recent unsteadiness. Check EKG if persistent next month at CPE.

## 2016-09-26 NOTE — Patient Instructions (Addendum)
Stop antibiotic. Ok to return to 81mg  aspirin.  Ok to stop diltiazem. Start atenolol 25mg  once daily - if worsening dizziness on it, stop and let me know.  Keep f/u appt next month.

## 2016-09-26 NOTE — Progress Notes (Signed)
Pre visit review using our clinic review tool, if applicable. No additional management support is needed unless otherwise documented below in the visit note. 

## 2016-09-26 NOTE — Assessment & Plan Note (Signed)
Unsteadiness that is resolving in setting of stopping dilt for tachycardia over the past month. Anticipate related to this. Reassuring evaluation at ER this past weekend. As UCx returned normal, rec stop abx. Discussed hydration status, will start atenolol and reassess next month at medicare wellness visit.

## 2016-10-08 ENCOUNTER — Other Ambulatory Visit: Payer: Self-pay | Admitting: Family Medicine

## 2016-10-08 DIAGNOSIS — N183 Chronic kidney disease, stage 3 (moderate): Principal | ICD-10-CM

## 2016-10-08 DIAGNOSIS — E1122 Type 2 diabetes mellitus with diabetic chronic kidney disease: Secondary | ICD-10-CM

## 2016-10-11 ENCOUNTER — Other Ambulatory Visit: Payer: Medicare Other

## 2016-10-11 ENCOUNTER — Ambulatory Visit (INDEPENDENT_AMBULATORY_CARE_PROVIDER_SITE_OTHER): Payer: Medicare Other

## 2016-10-11 VITALS — BP 110/80 | HR 64 | Temp 98.0°F | Ht 68.25 in | Wt 148.0 lb

## 2016-10-11 DIAGNOSIS — E1122 Type 2 diabetes mellitus with diabetic chronic kidney disease: Secondary | ICD-10-CM

## 2016-10-11 DIAGNOSIS — Z0000 Encounter for general adult medical examination without abnormal findings: Secondary | ICD-10-CM

## 2016-10-11 DIAGNOSIS — N183 Chronic kidney disease, stage 3 (moderate): Secondary | ICD-10-CM

## 2016-10-11 LAB — RENAL FUNCTION PANEL
ALBUMIN: 4.3 g/dL (ref 3.5–5.2)
BUN: 26 mg/dL — ABNORMAL HIGH (ref 6–23)
CALCIUM: 9.6 mg/dL (ref 8.4–10.5)
CO2: 31 mEq/L (ref 19–32)
CREATININE: 1.32 mg/dL (ref 0.40–1.50)
Chloride: 102 mEq/L (ref 96–112)
GFR: 54.12 mL/min — AB (ref >60.00)
GLUCOSE: 98 mg/dL (ref 70–99)
POTASSIUM: 5 meq/L (ref 3.5–5.1)
Phosphorus: 3.5 mg/dL (ref 2.3–4.6)
SODIUM: 139 meq/L (ref 135–145)

## 2016-10-11 LAB — MICROALBUMIN / CREATININE URINE RATIO
Creatinine,U: 108 mg/dL
MICROALB/CREAT RATIO: 3.3 mg/g (ref 0.0–30.0)
Microalb, Ur: 3.6 mg/dL — ABNORMAL HIGH (ref 0.0–1.9)

## 2016-10-11 NOTE — Progress Notes (Signed)
Subjective:   Harold Chavez is a 83 y.o. male who presents for Medicare Annual/Subsequent preventive examination.  Review of Systems:  N/A Cardiac Risk Factors include: advanced age (>61men, >33 women);male gender     Objective:    Vitals: BP 110/80 (BP Location: Left Arm, Patient Position: Sitting, Cuff Size: Normal)   Pulse 64   Temp 98 F (36.7 C) (Oral)   Ht 5' 8.25" (1.734 m)   Wt 148 lb (67.1 kg)   SpO2 95%   BMI 22.34 kg/m   Body mass index is 22.34 kg/m.  Tobacco History  Smoking Status  . Never Smoker  Smokeless Tobacco  . Never Used     Counseling given: No   Past Medical History:  Diagnosis Date  . B12 deficiency anemia   . BCC (basal cell carcinoma of skin) 2017   L frontal scalp  . Benign paroxysmal positional vertigo   . C. difficile diarrhea 07/14/2014   2015   . Chronic kidney disease   . History of pneumonia 07/14/2014   2015   . History of shingles 2010  . IgG gammopathy   . 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 Leukemia ([place])    CLL  . 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 Sarcoidosis ([place])   . Sensorineural hearing loss (SNHL) of both ears 10/2015   haering aide eval rec through [facility])  . Squamous cell carcinoma in situ of skin 2017   R forearm, nose, R cheek - [place]   Past Surgical History:  Procedure Laterality Date  . MOHS SURGERY  2015   R cheek lentigo maligna in situ  . TESTICLE REMOVAL  1937   right; undescended   Family History  Problem Relation Age of Onset  . Stroke Father    History  Sexual Activity  . Sexual activity: No    Outpatient Encounter Prescriptions as of 10/11/2016  Medication Sig  . aspirin 81 MG tablet Take 81 mg by mouth daily.  Marland Kitchen atenolol (TENORMIN) 25 MG tablet Take 1 tablet (25 mg total) by mouth daily.  . Cyanocobalamin (B-12 PO) Take 1 tablet by mouth daily.  . meclizine (ANTIVERT) 25 MG tablet Take 1 tablet (25 mg total) by  mouth 3 (three) times daily as needed for dizziness or nausea. (Patient not taking: Reported on 10/11/2016)  . [DISCONTINUED] aspirin 325 MG tablet Take 325 mg by mouth daily.  . [DISCONTINUED] cephALEXin (KEFLEX) 500 MG capsule Take 1 capsule (500 mg total) by mouth 2 (two) times daily.   No facility-administered encounter medications on file as of 10/11/2016.     Activities of Daily Living In your present state of health, do you have any difficulty performing the following activities: 10/11/2016  Hearing? N  Vision? N  Difficulty concentrating or making decisions? N  Walking or climbing stairs? N  Dressing or bathing? N  Doing errands, shopping? Y  Preparing Food and eating ? N  Using the Toilet? N  In the past six months, have you accidently leaked urine? N  Do you have problems with loss of bowel control? N  Managing your Medications? N  Managing your Finances? Y  Housekeeping or managing your Housekeeping? N  Some recent data might be hidden    Patient Care Team: Ria Bush, MD as PCP - General (Family Medicine) Robert Bellow, MD (General Surgery) Lequita Asal, MD as Referring Physician (Hematology and  Oncology) Ralene Bathe, MD as Consulting Physician (Ophthalmology)   Assessment:     Hearing Screening   125Hz  250Hz  500Hz  1000Hz  2000Hz  3000Hz  4000Hz  6000Hz  8000Hz   Right ear:   0 0 0  0    Left ear:   0 0 0  0    Comments: Audiology exam in 2017; pt recommended for hearing aids  Vision Screening Comments: Last vision exam in Feb 2017 with Dr. Claudean Kinds   Exercise Activities and Dietary recommendations Current Exercise Habits: Home exercise routine, Type of exercise: stretching;walking, Time (Minutes): 20, Frequency (Times/Week): 7, Weekly Exercise (Minutes/Week): 140, Intensity: Mild, Exercise limited by: None identified  Goals    . Increase physical activity          Starting 10/11/2016, I will continue to exercise at least 20 min daily.        Fall Risk Fall Risk  10/11/2016 10/16/2015  Falls in the past year? Yes No  Number falls in past yr: 1 -  Injury with Fall? No -   Depression Screen PHQ 2/9 Scores 10/11/2016 10/16/2015  PHQ - 2 Score 0 0    Cognitive Function MMSE - Mini Mental State Exam 10/11/2016  Orientation to time 5  Orientation to Place 5  Registration 3  Attention/ Calculation 3  Recall 0  Language- name 2 objects 0  Language- repeat 1  Language- follow 3 step command 3  Language- read & follow direction 0  Write a sentence 0  Copy design 0  Total score 20     PLEASE NOTE: A Mini-Cog screen was completed. Maximum score is 20. A value of 0 denotes this part of [place] MMSE was not completed or the patient failed this part of the Mini-Cog screening.   Mini-Cog Screening Orientation to Time - Max 5 pts Orientation to Place - Max 5 pts Registration - Max 3 pts Recall - Max 3 pts Language Repeat - Max 1 pts Language Follow 3 Step Command - Max 3 pts     Immunization History  Administered Date(s) Administered  . Influenza, High Dose Seasonal PF 08/29/2015  . Influenza-Unspecified 05/31/2014, 06/30/2016  . Pneumococcal Conjugate-13 10/16/2015  . Pneumococcal Polysaccharide-23 05/31/2014  . Td 03/02/2007   Screening Tests Health Maintenance  Topic Date Due  . DTaP/Tdap/Td (1 - Tdap) 03/01/2017 (Originally 03/03/2007)  . FOOT EXAM  10/11/2017 (Originally 06/06/1934)  . OPHTHALMOLOGY EXAM  10/31/2016  . TETANUS/TDAP  03/01/2017  . URINE MICROALBUMIN  10/11/2017  . INFLUENZA VACCINE  Completed  . PNA vac Low Risk Adult  Completed      Plan:     I have personally reviewed and addressed the Medicare Annual Wellness questionnaire and have noted the following in the patient's chart:  A. Medical and social history B. Use of alcohol, tobacco or illicit drugs  C. Current medications and supplements D. Functional ability and status E.  Nutritional status F.  Physical activity G. Advance  directives H. List of other physicians I.  Hospitalizations, surgeries, and ER visits in previous 12 months J.  [place] to include hearing, vision, cognitive, depression L. Referrals and appointments - none  In addition, I have reviewed and discussed with patient certain preventive protocols, quality metrics, and best practice recommendations. A written personalized care plan for preventive services as well as general preventive health recommendations were provided to patient.  See attached scanned questionnaire for additional information.   Signed,   Lindell Noe, MHA, BS, LPN Health Coach

## 2016-10-11 NOTE — Progress Notes (Signed)
PCP notes:   Health maintenance:  Eye exam - per pt, completed in Feb 2017 Urine microalbumin - completed  Abnormal screenings:   Hearing - failed Fall risk - hx of fall without injury  Patient concerns:   None  Nurse concerns:  None  Next PCP appt:   10/16/2016 @ 0830

## 2016-10-11 NOTE — Progress Notes (Signed)
Per Medicare guidelines, AWV cannot be billed prior to 10/16/16. No claim will be submitted for this encounter.

## 2016-10-11 NOTE — Progress Notes (Signed)
Pre visit review using our clinic review tool, if applicable. No additional management support is needed unless otherwise documented below in the visit note. 

## 2016-10-11 NOTE — Patient Instructions (Signed)
Harold Chavez , Thank you for taking time to come for your Medicare Wellness Visit. I appreciate your ongoing commitment to your health goals. Please review the following plan we discussed and let me know if I can assist you in the future.   These are the goals we discussed: Goals    . Increase physical activity          Starting 10/11/2016, I will continue to exercise at least 20 min daily.        This is a list of the screening recommended for you and due dates:  Health Maintenance  Topic Date Due  . DTaP/Tdap/Td vaccine (1 - Tdap) 03/01/2017*  . Tetanus Vaccine  03/01/2017  . Urine Protein Check  10/11/2017  . Flu Shot  Completed  . Pneumonia vaccines  Completed  *Topic was postponed. The date shown is not the original due date.   Preventive Care for Adults  A healthy lifestyle and preventive care can promote health and wellness. Preventive health guidelines for adults include the following key practices.  . A routine yearly physical is a good way to check with your health care provider about your health and preventive screening. It is a chance to share any concerns and updates on your health and to receive a thorough exam.  . Visit your dentist for a routine exam and preventive care every 6 months. Brush your teeth twice a day and floss once a day. Good oral hygiene prevents tooth decay and gum disease.  . The frequency of eye exams is based on your age, health, family medical history, use  of contact lenses, and other factors. Follow your health care provider's ecommendations for frequency of eye exams.  . Eat a healthy diet. Foods like vegetables, fruits, whole grains, low-fat dairy products, and lean protein foods contain the nutrients you need without too many calories. Decrease your intake of foods high in solid fats, added sugars, and salt. Eat the right amount of calories for you. Get information about a proper diet from your health care provider, if necessary.  . Regular  physical exercise is one of the most important things you can do for your health. Most adults should get at least 150 minutes of moderate-intensity exercise (any activity that increases your heart rate and causes you to sweat) each week. In addition, most adults need muscle-strengthening exercises on 2 or more days a week.  Silver Sneakers may be a benefit available to you. To determine eligibility, you may visit the website: www.silversneakers.com or contact program at [phone] Mon-Fri between 8AM-8PM.   . Maintain a healthy weight. The body mass index (BMI) is a screening tool to identify possible weight problems. It provides an estimate of body fat based on height and weight. Your health care provider can find your BMI and can help you achieve or maintain a healthy weight.   For adults 20 years and older: ? A BMI below 18.5 is considered underweight. ? A BMI of 18.5 to 24.9 is normal. ? A BMI of 25 to 29.9 is considered overweight. ? A BMI of 30 and above is considered obese.   . Maintain normal blood lipids and cholesterol levels by exercising and minimizing your intake of saturated fat. Eat a balanced diet with plenty of fruit and vegetables. Blood tests for lipids and cholesterol should begin at age 53 and be repeated every 5 years. If your lipid or cholesterol levels are high, you are over 50, or you are at high risk  for heart disease, you may need your cholesterol levels checked more frequently. Ongoing high lipid and cholesterol levels should be treated with medicines if diet and exercise are not working.  . If you smoke, find out from your health care provider how to quit. If you do not use tobacco, please do not start.  . If you choose to drink alcohol, please do not consume more than 2 drinks per day. One drink is considered to be 12 ounces (355 mL) of beer, 5 ounces (148 mL) of wine, or 1.5 ounces (44 mL) of liquor.  . If you are 80-7 years old, ask your health care provider if  you should take aspirin to prevent strokes.  . Use sunscreen. Apply sunscreen liberally and repeatedly throughout the day. You should seek shade when your shadow is shorter than you. Protect yourself by wearing long sleeves, pants, a wide-brimmed hat, and sunglasses year round, whenever you are outdoors.  . Once a month, do a whole body skin exam, using a mirror to look at the skin on your back. Tell your health care provider of new moles, moles that have irregular borders, moles that are larger than a pencil eraser, or moles that have changed in shape or color.

## 2016-10-16 ENCOUNTER — Ambulatory Visit: Payer: Medicare Other | Admitting: Family Medicine

## 2016-11-01 ENCOUNTER — Ambulatory Visit (INDEPENDENT_AMBULATORY_CARE_PROVIDER_SITE_OTHER): Payer: Medicare Other | Admitting: Family Medicine

## 2016-11-01 ENCOUNTER — Encounter: Payer: Self-pay | Admitting: Family Medicine

## 2016-11-01 VITALS — BP 110/78 | HR 78 | Temp 97.6°F | Resp 16 | Ht 68.0 in | Wt 152.0 lb

## 2016-11-01 DIAGNOSIS — R42 Dizziness and giddiness: Secondary | ICD-10-CM

## 2016-11-01 DIAGNOSIS — N183 Chronic kidney disease, stage 3 unspecified: Secondary | ICD-10-CM

## 2016-11-01 DIAGNOSIS — C9110 Chronic lymphocytic leukemia of B-cell type not having achieved remission: Secondary | ICD-10-CM | POA: Diagnosis not present

## 2016-11-01 DIAGNOSIS — D472 Monoclonal gammopathy: Secondary | ICD-10-CM

## 2016-11-01 DIAGNOSIS — D531 Other megaloblastic anemias, not elsewhere classified: Secondary | ICD-10-CM | POA: Diagnosis not present

## 2016-11-01 DIAGNOSIS — H903 Sensorineural hearing loss, bilateral: Secondary | ICD-10-CM

## 2016-11-01 NOTE — Progress Notes (Signed)
BP 110/78   Pulse 78   Temp 97.6 F (36.4 C) (Oral)   Resp 16   Ht 5\' 8"  (1.727 m)   Wt 152 lb (68.9 kg)   SpO2 98%   BMI 23.11 kg/m    CC: AMW f/u visit Subjective:    Patient ID: Harold Chavez, male    DOB: [birth year], 83 y.o.   MRN: [id number]  HPI: Harold Chavez is a 83 y.o. male presenting on 11/01/2016 for Annual Exam (DM II, Tachycardia, )   Here with son today. Saw Harold Chavez last month for medicare wellness visit. Note reviewed. Failed hearing screen. Saw audiologist - decided against hearing aides. Doesn't drive.   Dizziness episode late last year s/p ER evaluation - thought related to stopping diltiazem for h/o tachycardia. We started atenolol 25mg  daily. BP, HR stable on current regimen.   He has been exercising with resistance band and regular walking. He overall feels well today.   Known small lymphocytic lymphoma and CLL followed by Harold Chavez Harold Chavez Q6 months, last visit 07/2016. S/p bendamustine and rituxan (2015). Did not receive 6th treatment due to PNA with sepsis and C diff hospitalization. Also with sarcoidosis, IgG gammopathy and B12 deficiency.   ?DM dx in chart - error. Removed. Lab Results  Component Value Date   HGBA1C 5.2 05/10/2014     Preventative: Colon cancer screening - aged out Prostate cancer screening - aged out Lung cancer screening - aged out, not candidate Flu shot yearly  Tetanus shot - 2008 Pneumovax - 05/2014, prevnar 2017 Shingles shot - not a candidate Advanced directive discussion - has at home. Son Harold Chavez is HCPOA. Asked to bring me copy. Seat belt use discussed Sunscreen use and skin screen discussed. 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 Interim medical history  since our last visit reviewed. Allergies and medications reviewed and updated. Current Outpatient Prescriptions on File Prior to Visit  Medication Sig  . aspirin 81 MG tablet Take 81 mg by mouth daily.  Marland Kitchen atenolol (TENORMIN) 25 MG tablet Take 1 tablet (25 mg total) by mouth daily.  . Cyanocobalamin (B-12 PO) Take 1 tablet by mouth daily.  . meclizine (ANTIVERT) 25 MG tablet Take 1 tablet (25 mg total) by mouth 3 (three) times daily as needed for dizziness or nausea.   No current facility-administered medications on file prior to visit.     Review of Systems Per HPI unless specifically indicated in ROS section     Objective:    BP 110/78   Pulse 78   Temp 97.6 F (36.4 C) (Oral)   Resp 16   Ht 5\' 8"  (1.727 m)   Wt 152 lb (68.9 kg)   SpO2 98%   BMI 23.11 kg/m   Wt Readings from Last 3 Encounters:  11/01/16 152 lb (68.9 kg)  10/11/16 148 lb (67.1 kg)  09/26/16 146 lb 4 oz (66.3 kg)    Physical Exam  Constitutional: He appears well-developed and well-nourished. No distress.  HENT:  Head: Normocephalic and atraumatic.  Mouth/Throat: Oropharynx is clear and moist. No oropharyngeal exudate.  Neck: Carotid bruit is not present. No thyromegaly present.  Cardiovascular: Normal rate, regular rhythm, normal heart sounds and intact distal pulses.  No murmur heard. Pulmonary/Chest: Effort normal and breath sounds normal. No respiratory distress. He has no wheezes. He has no rales.  Musculoskeletal: He exhibits no edema.  Lymphadenopathy:    He has no cervical adenopathy.  Skin: Skin is warm and dry. No rash noted.  Psychiatric: He has a normal mood and affect.  Nursing note and vitals reviewed.  Results for orders placed or performed in visit on 10/11/16  Renal function panel  Result Value Ref Range   Sodium 139 135 - 145 mEq/L   Potassium 5.0 3.5 - 5.1 mEq/L   Chloride 102 96 - 112 mEq/L   CO2 31 19 - 32 mEq/L   Calcium 9.6 8.4 - 10.5 mg/dL   Albumin 4.3 3.5 - 5.2 g/dL    BUN 26 (H) 6 - 23 mg/dL   Creatinine, Ser 1.32 0.40 - 1.50 mg/dL   Glucose, Bld 98 70 - 99 mg/dL   Phosphorus 3.5 2.3 - 4.6 mg/dL   GFR 54.12 (L) >60.00 mL/min  Microalbumin/Creatinine Ratio, Urine  Result Value Ref Range   Microalb, Ur 3.6 (H) 0.0 - 1.9 mg/dL   Creatinine,U 108.0 mg/dL   Microalb Creat Ratio 3.3 0.0 - 30.0 mg/g      Assessment & Plan:   Problem List Items Addressed This Visit    Chronic lymphocytic leukemia ([place])    Appreciate onc care. Stable period.       CKD (chronic kidney disease) stage 3, GFR 30-59 ml/min - Primary    Chronic, stable. Discussed importance of good hydration status as well as avoiding NSAIDs and other nephrotoxins      Dizziness    Improved with addition of atenolol. Continue.       IgG gammopathy    Followed by onc.       Megaloblastic anemia due to B12 deficiency    Continue oral B12. Tolerating well.       Sensorineural hearing loss (SNHL) of both ears    S/p hearing evaluation. decided against hearing aides.           Follow up plan: Return in about 1 year (around 11/01/2017) for medicare wellness visit.  Harold Bush, MD

## 2016-11-01 NOTE — Assessment & Plan Note (Signed)
Improved with addition of atenolol. Continue.

## 2016-11-01 NOTE — Patient Instructions (Signed)
Good to see you today. You are doing well today. Return as needed or in 1 year for next check up.  May try funginail to affected nails You don't have diabetes.

## 2016-11-01 NOTE — Assessment & Plan Note (Addendum)
Continue oral B12. Tolerating well.

## 2016-11-01 NOTE — Assessment & Plan Note (Addendum)
Chronic, stable. Discussed importance of good hydration status as well as avoiding NSAIDs and other nephrotoxins

## 2016-11-01 NOTE — Assessment & Plan Note (Signed)
S/p hearing evaluation. decided against hearing aides.

## 2016-11-01 NOTE — Assessment & Plan Note (Signed)
Appreciate onc care. Stable period.  

## 2016-11-01 NOTE — Assessment & Plan Note (Signed)
Followed by onc.

## 2016-11-01 NOTE — Progress Notes (Signed)
Pre-visit discussion using our clinic review tool. No additional management support is needed unless otherwise documented below in the visit note.  

## 2016-11-28 DIAGNOSIS — H25043 Posterior subcapsular polar age-related cataract, bilateral: Secondary | ICD-10-CM | POA: Diagnosis not present

## 2016-11-28 DIAGNOSIS — H25013 Cortical age-related cataract, bilateral: Secondary | ICD-10-CM | POA: Diagnosis not present

## 2016-11-28 DIAGNOSIS — H2513 Age-related nuclear cataract, bilateral: Secondary | ICD-10-CM | POA: Diagnosis not present

## 2016-11-28 DIAGNOSIS — H524 Presbyopia: Secondary | ICD-10-CM | POA: Diagnosis not present

## 2017-02-17 DIAGNOSIS — D225 Melanocytic nevi of trunk: Secondary | ICD-10-CM | POA: Diagnosis not present

## 2017-02-17 DIAGNOSIS — L814 Other melanin hyperpigmentation: Secondary | ICD-10-CM | POA: Diagnosis not present

## 2017-02-17 DIAGNOSIS — L570 Actinic keratosis: Secondary | ICD-10-CM | POA: Diagnosis not present

## 2017-02-17 DIAGNOSIS — D1800 Hemangioma unspecified site: Secondary | ICD-10-CM | POA: Diagnosis not present

## 2017-02-17 DIAGNOSIS — L821 Other seborrheic keratosis: Secondary | ICD-10-CM | POA: Diagnosis not present

## 2017-02-17 DIAGNOSIS — Z85828 Personal history of other malignant neoplasm of skin: Secondary | ICD-10-CM | POA: Diagnosis not present

## 2017-02-17 DIAGNOSIS — Z85820 Personal history of malignant melanoma of skin: Secondary | ICD-10-CM | POA: Diagnosis not present

## 2017-02-24 NOTE — Progress Notes (Signed)
[facility] day:  02/25/17  Chief Complaint: Harold Chavez is a 83 y.o. male with CLL who is seen for 6 month assessment.  HPI: The patient was last seen in the medical oncology clinic on 08/26/2016.  At that time, he was doing well.  He denied any B symptoms.  Exam revealed no adenopathy or hepatosplenomegaly.  Counts were stable.  Labs included a hematocrit of 42.6, hemoglobin 14.6, MCV 92.6, platelets 274,000, WBC 8400 with an ANC of 6000.  LDH was 124.   During the interim, he has felt "pretty good".  He denies any B symptoms.  He has been working outside.   Past Medical History:  Diagnosis Date  . B12 deficiency anemia   . BCC (basal cell carcinoma of skin) 2017   L frontal scalp  . Benign paroxysmal positional vertigo   . C. difficile diarrhea 07/14/2014   2015   . Chronic kidney disease   . History of pneumonia 07/14/2014   2015   . History of shingles 2010  . IgG gammopathy   . 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 Leukemia ([place])    CLL  . Lymphoma, small lymphocytic ([place]) 2015   (onc at [facility])  . Sarcoidosis ([place])   . Sensorineural hearing loss (SNHL) of both ears 10/2015   haering aide eval rec through [facility])  . Squamous cell carcinoma in situ of skin 2017   R forearm, nose, R cheek - [place]    Past Surgical History:  Procedure Laterality Date  . MOHS SURGERY  2015   R cheek lentigo maligna in situ  . TESTICLE REMOVAL  1937   right; undescended    Family History  Problem Relation Age of Onset  . Stroke Father     Social History:  reports that he has never smoked. He has never used smokeless tobacco. He reports that he does not drink alcohol or use drugs.  The patient is accompanied by his son, Harold Chavez,  today.  Allergies: No Known Allergies  Current Medications: Current Outpatient Prescriptions  Medication Sig Dispense Refill  . aspirin 81 MG tablet Take 81 mg by  mouth daily.    Marland Kitchen atenolol (TENORMIN) 25 MG tablet Take 1 tablet (25 mg total) by mouth daily. 30 tablet 6  . Cyanocobalamin (B-12 PO) Take 1 tablet by mouth daily.    . meclizine (ANTIVERT) 25 MG tablet Take 1 tablet (25 mg total) by mouth 3 (three) times daily as needed for dizziness or nausea. 20 tablet 0   No current facility-administered medications for this visit.     Review of Systems:  GENERAL:  Feels "pretty good".  No fevers or sweats.  Weight up 6 pounds. PERFORMANCE STATUS (ECOG):  1 HEENT:  No visual changes, runny nose, sore throat, mouth sores or tenderness. Lungs: No shortness of breath or cough.  No hemoptysis. Cardiac:  No chest pain, palpitations, orthopnea, or PND. GI:  No nausea, vomiting, diarrhea, constipation, melena or hematochezia. GU:  Nocturia (no change).  No urgency, dysuria, or hematuria. Musculoskeletal:  No bone pain.  No joint pain.  No muscle tenderness. Extremities:  No pain or swelling. Skin:  No rashes or skin changes. Neuro:  No headache, numbness or weakness, balance or coordination issues. Endocrine:  No diabetes, thyroid issues, hot flashes or night sweats. Psych:  No mood changes, depression or anxiety. Pain:  No focal pain. Review of  systems:  All other systems reviewed and found to be negative.  Physical Exam: Blood pressure 123/71, pulse 67, temperature (!) 96.6 F (35.9 C), resp. rate 18, weight 152 lb 3 oz (69 kg). GENERAL:  Thin elderly gentleman sitting comfortably in the exam room in no acute distress. MENTAL STATUS:  Alert and oriented to person, place and time. HEAD:  Short gray hair.  Normocephalic, atraumatic, face symmetric, no Cushingoid features. EYES:  Glasses.  Blue eyes.  Pupils equal round and reactive to light and accomodation.  No conjunctivitis or scleral icterus. ENT:  Oropharynx clear without lesion.  Tongue normal. Mucous membranes moist.  RESPIRATORY:  Clear to auscultation without rales, wheezes or  rhonchi. CARDIOVASCULAR:  Regular rate and rhythm without murmur, rub or gallop. ABDOMEN:  Soft, non-tender, with active bowel sounds, and no hepatosplenomegaly.  No masses. SKIN:  No rashes, ulcers or lesions. EXTREMITIES: 4-5 cm cyst right elbow cyst (old).  No edema, no skin discoloration or tenderness.  No palpable cords. LYMPH NODES: No palpable cervical, supraclavicular, axillary or inguinal adenopathy  NEUROLOGICAL: Unremarkable. PSYCH:  Appropriate.   Appointment on 02/25/2017  Component Date Value Ref Range Status  . WBC 02/25/2017 7.7  3.8 - 10.6 K/uL Final  . RBC 02/25/2017 4.37* 4.40 - 5.90 MIL/uL Final  . Hemoglobin 02/25/2017 14.2  13.0 - 18.0 g/dL Final  . HCT 02/25/2017 40.8  40.0 - 52.0 % Final  . MCV 02/25/2017 93.3  80.0 - 100.0 fL Final  . MCH 02/25/2017 32.4  26.0 - 34.0 pg Final  . MCHC 02/25/2017 34.7  32.0 - 36.0 g/dL Final  . RDW 02/25/2017 13.7  11.5 - 14.5 % Final  . Platelets 02/25/2017 249  150 - 440 K/uL Final  . Neutrophils Relative % 02/25/2017 69  % Final  . Neutro Abs 02/25/2017 5.3  1.4 - 6.5 K/uL Final  . Lymphocytes Relative 02/25/2017 21  % Final  . Lymphs Abs 02/25/2017 1.6  1.0 - 3.6 K/uL Final  . Monocytes Relative 02/25/2017 7  % Final  . Monocytes Absolute 02/25/2017 0.6  0.2 - 1.0 K/uL Final  . Eosinophils Relative 02/25/2017 3  % Final  . Eosinophils Absolute 02/25/2017 0.2  0 - 0.7 K/uL Final  . Basophils Relative 02/25/2017 0  % Final  . Basophils Absolute 02/25/2017 0.0  0 - 0.1 K/uL Final  . Sodium 02/25/2017 139  135 - 145 mmol/L Final  . Potassium 02/25/2017 4.8  3.5 - 5.1 mmol/L Final  . Chloride 02/25/2017 103  101 - 111 mmol/L Final  . CO2 02/25/2017 29  22 - 32 mmol/L Final  . Glucose, Bld 02/25/2017 82  65 - 99 mg/dL Final  . BUN 02/25/2017 32* 6 - 20 mg/dL Final  . Creatinine, Ser 02/25/2017 1.62* 0.61 - 1.24 mg/dL Final  . Calcium 02/25/2017 9.4  8.9 - 10.3 mg/dL Final  . Total Protein 02/25/2017 7.8  6.5 - 8.1 g/dL  Final  . Albumin 02/25/2017 4.4  3.5 - 5.0 g/dL Final  . AST 02/25/2017 23  15 - 41 U/L Final  . ALT 02/25/2017 12* 17 - 63 U/L Final  . Alkaline Phosphatase 02/25/2017 65  38 - 126 U/L Final  . Total Bilirubin 02/25/2017 1.0  0.3 - 1.2 mg/dL Final  . GFR calc non Af Amer 02/25/2017 36* >60 mL/min Final  . GFR calc Af Amer 02/25/2017 41* >60 mL/min Final   Comment: (NOTE) The eGFR has been calculated using the CKD EPI equation. This calculation  has not been validated in all clinical situations. eGFR's persistently <60 mL/min signify possible Chronic Kidney Disease.   . Anion gap 02/25/2017 7  5 - 15 Final    Assessment:  Harold Chavez is a 83 y.o. male with chronic lymphocytic leukemia (CLL).  He presented in 10/2013 with dehydration and syncope.  Evaluation revealed axillary adenopathy.    He received 5 of 6 cycles of planned bendamustine and Rituxan (12/17/2013 - 04/27/2014).  With cycle #5 he developed neutropenia and received GCSF.  He was subsequently admitted with sepsis and pneumonia.  Course was complicated by C diff.  Decision was made to defer his last cycle of chemotherapy.  He has a monoclonal gammopathy of unclear significance.  IgG was 1101 on 08/19/2014.  Review of prior labs noted no M-spike, but elevated kappa light chains on 12/01/2013.  Kappa free light chains were 677.68 (high), lambda free light chains 453.8 (high), and ratio 1.49 (normal) on 12/01/2013.  Kappa free light chains were 34.64 (high), lambda free light chains 22.11 (high), with a ratio 1.57 (normal) on 11/17/2014.    Symptomatically, he is doing well.  He denies any B symptoms.  Exam reveals no adenopathy or hepatosplenomegaly.  Counts are stable.  Plan: 1.  Labs today: CBC with diff, CMP.  2.  RTC in 6 months for MD assessment and labs (CBC, BMP).   Lequita Asal, MD  02/25/2017, 10:32 AM

## 2017-02-25 ENCOUNTER — Inpatient Hospital Stay (HOSPITAL_BASED_OUTPATIENT_CLINIC_OR_DEPARTMENT_OTHER): Payer: Medicare Other | Admitting: Hematology and Oncology

## 2017-02-25 ENCOUNTER — Inpatient Hospital Stay: Payer: Medicare Other | Attending: Hematology and Oncology

## 2017-02-25 VITALS — BP 123/71 | HR 67 | Temp 96.6°F | Resp 18 | Wt 152.2 lb

## 2017-02-25 DIAGNOSIS — D519 Vitamin B12 deficiency anemia, unspecified: Secondary | ICD-10-CM | POA: Diagnosis not present

## 2017-02-25 DIAGNOSIS — Z79899 Other long term (current) drug therapy: Secondary | ICD-10-CM | POA: Insufficient documentation

## 2017-02-25 DIAGNOSIS — D869 Sarcoidosis, unspecified: Secondary | ICD-10-CM | POA: Insufficient documentation

## 2017-02-25 DIAGNOSIS — D472 Monoclonal gammopathy: Secondary | ICD-10-CM | POA: Diagnosis not present

## 2017-02-25 DIAGNOSIS — H8110 Benign paroxysmal vertigo, unspecified ear: Secondary | ICD-10-CM | POA: Insufficient documentation

## 2017-02-25 DIAGNOSIS — Z8619 Personal history of other infectious and parasitic diseases: Secondary | ICD-10-CM | POA: Diagnosis not present

## 2017-02-25 DIAGNOSIS — Z7982 Long term (current) use of aspirin: Secondary | ICD-10-CM | POA: Insufficient documentation

## 2017-02-25 DIAGNOSIS — C9110 Chronic lymphocytic leukemia of B-cell type not having achieved remission: Secondary | ICD-10-CM | POA: Insufficient documentation

## 2017-02-25 DIAGNOSIS — Z85828 Personal history of other malignant neoplasm of skin: Secondary | ICD-10-CM | POA: Insufficient documentation

## 2017-02-25 DIAGNOSIS — Z9221 Personal history of antineoplastic chemotherapy: Secondary | ICD-10-CM | POA: Insufficient documentation

## 2017-02-25 DIAGNOSIS — N189 Chronic kidney disease, unspecified: Secondary | ICD-10-CM | POA: Insufficient documentation

## 2017-02-25 DIAGNOSIS — H905 Unspecified sensorineural hearing loss: Secondary | ICD-10-CM | POA: Insufficient documentation

## 2017-02-25 LAB — CBC WITH DIFFERENTIAL/PLATELET
Basophils Absolute: 0 10*3/uL (ref 0–0.1)
Basophils Relative: 0 %
Eosinophils Absolute: 0.2 10*3/uL (ref 0–0.7)
Eosinophils Relative: 3 %
HCT: 40.8 % (ref 40.0–52.0)
Hemoglobin: 14.2 g/dL (ref 13.0–18.0)
Lymphocytes Relative: 21 %
Lymphs Abs: 1.6 10*3/uL (ref 1.0–3.6)
MCH: 32.4 pg (ref 26.0–34.0)
MCHC: 34.7 g/dL (ref 32.0–36.0)
MCV: 93.3 fL (ref 80.0–100.0)
Monocytes Absolute: 0.6 10*3/uL (ref 0.2–1.0)
Monocytes Relative: 7 %
Neutro Abs: 5.3 10*3/uL (ref 1.4–6.5)
Neutrophils Relative %: 69 %
Platelets: 249 10*3/uL (ref 150–440)
RBC: 4.37 MIL/uL — ABNORMAL LOW (ref 4.40–5.90)
RDW: 13.7 % (ref 11.5–14.5)
WBC: 7.7 10*3/uL (ref 3.8–10.6)

## 2017-02-25 LAB — COMPREHENSIVE METABOLIC PANEL
ALT: 12 U/L — ABNORMAL LOW (ref 17–63)
AST: 23 U/L (ref 15–41)
Albumin: 4.4 g/dL (ref 3.5–5.0)
Alkaline Phosphatase: 65 U/L (ref 38–126)
Anion gap: 7 (ref 5–15)
BUN: 32 mg/dL — ABNORMAL HIGH (ref 6–20)
CO2: 29 mmol/L (ref 22–32)
Calcium: 9.4 mg/dL (ref 8.9–10.3)
Chloride: 103 mmol/L (ref 101–111)
Creatinine, Ser: 1.62 mg/dL — ABNORMAL HIGH (ref 0.61–1.24)
GFR calc Af Amer: 41 mL/min — ABNORMAL LOW (ref >60)
GFR calc non Af Amer: 36 mL/min — ABNORMAL LOW (ref >60)
Glucose, Bld: 82 mg/dL (ref 65–99)
Potassium: 4.8 mmol/L (ref 3.5–5.1)
Sodium: 139 mmol/L (ref 135–145)
Total Bilirubin: 1 mg/dL (ref 0.3–1.2)
Total Protein: 7.8 g/dL (ref 6.5–8.1)

## 2017-02-25 NOTE — Progress Notes (Signed)
Patient offers no concerns today. 

## 2017-03-18 ENCOUNTER — Other Ambulatory Visit: Payer: Self-pay | Admitting: Family Medicine

## 2017-05-22 ENCOUNTER — Encounter: Payer: Self-pay | Admitting: Hematology and Oncology

## 2017-06-09 ENCOUNTER — Telehealth: Payer: Self-pay | Admitting: Family Medicine

## 2017-06-09 NOTE — Telephone Encounter (Signed)
Unable to contact caller to see which ED pt was going to.

## 2017-06-09 NOTE — Telephone Encounter (Signed)
Patient Name: Harold Chavez  DOB: 1924-03-08    Initial Comment Caller's dad is feeling dizziness.    Nurse Assessment  Nurse: Verlin Fester RN, [facility] Date/Time Eilene Ghazi Time): 06/09/2017 1:29:43 PM  Confirm and document reason for call. If symptomatic, describe symptoms. ---Caller states his father is unsteady with his walking and he had a dizzy spell  Does the patient have any new or worsening symptoms? ---Yes  Will a triage be completed? ---Yes  Related visit to physician within the last 2 weeks? ---No  Does the PT have any chronic conditions? (i.e. diabetes, asthma, etc.) ---Yes  List chronic conditions. ---"fast heart rate"  Is this a behavioral health or substance abuse call? ---No     Guidelines    Guideline Title Affirmed Question Affirmed Notes  Dizziness - Lightheadedness SEVERE dizziness (e.g., unable to stand, requires support to walk, feels like passing out now)    Final Disposition User   Go to ED Now (or PCP triage) Verlin Fester, RN, [facility]    Referrals  [place] UNDECIDED   Disagree/Comply: Comply

## 2017-06-10 ENCOUNTER — Ambulatory Visit: Payer: Managed Care, Other (non HMO) | Admitting: Internal Medicine

## 2017-06-10 ENCOUNTER — Emergency Department
Admission: EM | Admit: 2017-06-10 | Discharge: 2017-06-10 | Disposition: A | Discharge status: Home / Self Care | Payer: Medicare Other | Attending: Emergency Medicine | Admitting: Emergency Medicine

## 2017-06-10 DIAGNOSIS — Z85828 Personal history of other malignant neoplasm of skin: Secondary | ICD-10-CM | POA: Insufficient documentation

## 2017-06-10 DIAGNOSIS — Z79899 Other long term (current) drug therapy: Secondary | ICD-10-CM | POA: Diagnosis not present

## 2017-06-10 DIAGNOSIS — H8149 Vertigo of central origin, unspecified ear: Secondary | ICD-10-CM | POA: Insufficient documentation

## 2017-06-10 DIAGNOSIS — R42 Dizziness and giddiness: Secondary | ICD-10-CM | POA: Diagnosis not present

## 2017-06-10 DIAGNOSIS — N183 Chronic kidney disease, stage 3 (moderate): Secondary | ICD-10-CM | POA: Diagnosis not present

## 2017-06-10 DIAGNOSIS — Z7982 Long term (current) use of aspirin: Secondary | ICD-10-CM | POA: Insufficient documentation

## 2017-06-10 LAB — CBC WITH DIFFERENTIAL/PLATELET
BASOS PCT: 0 %
Basophils Absolute: 0 10*3/uL (ref 0–0.1)
EOS ABS: 0.2 10*3/uL (ref 0–0.7)
Eosinophils Relative: 2 %
HCT: 40.7 % (ref 40.0–52.0)
HEMOGLOBIN: 14.3 g/dL (ref 13.0–18.0)
LYMPHS ABS: 1.4 10*3/uL (ref 1.0–3.6)
LYMPHS PCT: 16 %
MCH: 32.9 pg (ref 26.0–34.0)
MCHC: 35.1 g/dL (ref 32.0–36.0)
MCV: 93.8 fL (ref 80.0–100.0)
Monocytes Absolute: 0.6 10*3/uL (ref 0.2–1.0)
Monocytes Relative: 7 %
Neutro Abs: 6.6 10*3/uL — ABNORMAL HIGH (ref 1.4–6.5)
Neutrophils Relative %: 75 %
PLATELETS: 241 10*3/uL (ref 150–440)
RBC: 4.33 MIL/uL — AB (ref 4.40–5.90)
RDW: 14.1 % (ref 11.5–14.5)
WBC: 8.8 10*3/uL (ref 3.8–10.6)

## 2017-06-10 LAB — TROPONIN I: Troponin I: <0.03 ng/mL (ref <0.03)

## 2017-06-10 LAB — COMPREHENSIVE METABOLIC PANEL
ALBUMIN: 4.4 g/dL (ref 3.5–5.0)
ALK PHOS: 55 U/L (ref 38–126)
ALT: 14 U/L — AB (ref 17–63)
ANION GAP: 6 (ref 5–15)
AST: 22 U/L (ref 15–41)
BUN: 26 mg/dL — ABNORMAL HIGH (ref 6–20)
CALCIUM: 9.1 mg/dL (ref 8.9–10.3)
CHLORIDE: 105 mmol/L (ref 101–111)
CO2: 29 mmol/L (ref 22–32)
CREATININE: 1.22 mg/dL (ref 0.61–1.24)
GFR calc Af Amer: 58 mL/min — ABNORMAL LOW (ref >60)
GFR calc non Af Amer: 50 mL/min — ABNORMAL LOW (ref >60)
GLUCOSE: 103 mg/dL — AB (ref 65–99)
Potassium: 4.9 mmol/L (ref 3.5–5.1)
SODIUM: 140 mmol/L (ref 135–145)
Total Bilirubin: 1 mg/dL (ref 0.3–1.2)
Total Protein: 7.1 g/dL (ref 6.5–8.1)

## 2017-06-10 MED ORDER — SODIUM CHLORIDE 0.9 % IV SOLN
1000.0000 mL | Freq: Once | INTRAVENOUS | Status: AC
  Administered 2017-06-10: 1000 mL via INTRAVENOUS

## 2017-06-10 MED ORDER — MECLIZINE HCL 25 MG PO TABS
ORAL_TABLET | ORAL | Status: AC
  Filled 2017-06-10: qty 1

## 2017-06-10 MED ORDER — MECLIZINE HCL 25 MG PO TABS
25.0000 mg | ORAL_TABLET | Freq: Once | ORAL | Status: AC
  Administered 2017-06-10: 25 mg via ORAL

## 2017-06-10 MED ORDER — MECLIZINE HCL 25 MG PO TABS: 25.0000 mg | ORAL_TABLET | Freq: Three times a day (TID) | ORAL | 0 refills | Status: DC | PRN

## 2017-06-10 NOTE — ED Provider Notes (Signed)
[facility] Emergency Department Provider Note   ____________________________________________    I have reviewed the triage vital signs and the nursing notes.   HISTORY  Chief Complaint Dizziness     HPI Harold Chavez is a 83 y.o. male who presents with episodes of dizziness, described as room spinning. Patient reports he was reaching for an item in a shelf above his head and developed vertigo. Son scheduled a doctor's visit but today the patient felt vertiginous so came to the ED. Patient lives with his son and notes that typically he is able to get around without difficulty. he denies chest pain. No shortness of breath. No neuro deficits. Does have a history of benign positional vertigo. No fevers or chills. No cough. No headache   Past Medical History:  Diagnosis Date  . B12 deficiency anemia   . BCC (basal cell carcinoma of skin) 2017   L frontal scalp  . Benign paroxysmal positional vertigo   . C. difficile diarrhea 07/14/2014   2015   . Chronic kidney disease   . History of pneumonia 07/14/2014   2015   . History of shingles 2010  . IgG gammopathy   . 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 Leukemia ([place])    CLL  . 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 Sarcoidosis   . 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 Squamous cell carcinoma in situ of skin 2017   R forearm, nose, R cheek - [place]    Patient Active Problem List   Diagnosis Date Noted  . Dizziness 09/26/2016  . Advanced care planning/counseling discussion 10/16/2015  . Medicare annual wellness visit, initial 10/16/2015  . Sensorineural hearing loss (SNHL) of both ears 10/01/2015  . Tachycardia 04/14/2015  . Left shoulder pain 10/14/2014  . Protein-calorie malnutrition, mild ([place])   . Chronic lymphocytic leukemia ([place])   . IgG gammopathy 11/19/2013  .  Generalized lymphadenopathy 11/10/2013  . 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 CKD (chronic kidney disease) stage 3, GFR 30-59 ml/min 11/10/2013  . Lentigo maligna of right cheek ([place]) 11/10/2013  . BENIGN POSITIONAL VERTIGO 04/17/2009  . Sarcoidosis 03/02/2007    Past Surgical History:  Procedure Laterality Date  . MOHS SURGERY  2015   R cheek lentigo maligna in situ  . TESTICLE REMOVAL  1937   right; undescended    Prior to Admission medications   Medication Sig Start Date End Date Taking? Authorizing Provider  aspirin 81 MG tablet Take 81 mg by mouth daily.    [provider]  atenolol (TENORMIN) 25 MG tablet TAKE 1 TABLET (25 MG TOTAL) BY MOUTH DAILY. 03/18/17   Ria Bush, MD  Cyanocobalamin (B-12 PO) Take 1 tablet by mouth daily.    [provider]  meclizine (ANTIVERT) 25 MG tablet Take 1 tablet (25 mg total) by mouth 3 (three) times daily as needed for dizziness. 06/10/17   Lavonia Drafts, MD     Allergies Patient has no known allergies.  Family History  Problem Relation Age of Onset  . Stroke Father     Social History Social History  Substance Use Topics  . Smoking status: Never Smoker  . Smokeless tobacco: Never Used  . Alcohol use No    Review of Systems  Constitutional: No fever/chills Eyes: No visual changes.  ENT:  No Neck pain Cardiovascular: Denies chest pain. Respiratory: Denies shortness of breath. Gastrointestinal: No abdominal pain.  No nausea, no vomiting.   Genitourinary: Negative for dysuria. Musculoskeletal: Negative for back pain. Skin: Negative for rash. Neurological: Negative for headaches or weakness, dizziness as above   ____________________________________________   PHYSICAL EXAM:  VITAL SIGNS: ED Triage Vitals [06/10/17 0715]  Enc Vitals Group     BP (!) 144/72     Pulse Rate 77     Resp 16     Temp 98 F (36.7 C)     Temp Source Oral     SpO2 97 %     Weight 68 kg (150 lb)      Height 1.753 m (5\' 9" )     Head Circumference      Peak Flow      Pain Score      Pain Loc      Pain Edu?      Excl. in [place]?     Constitutional: Alert and oriented. No acute distress. Pleasant and interactive Eyes: Conjunctivae are normal.  Head: Atraumatic. Nose: No congestion/rhinnorhea. Mouth/Throat: Mucous membranes are moist.   Neck:  Painless ROM Cardiovascular: Normal rate, regular rhythm. Grossly normal heart sounds.  Good peripheral circulation. Respiratory: Normal respiratory effort.  No retractions. Lungs CTAB. Gastrointestinal: Soft and nontender. No distention.  No CVA tenderness. Genitourinary: deferred Musculoskeletal: No lower extremity tenderness nor edema.  Warm and well perfused Neurologic:  Normal speech and language. No gross focal neurologic deficits are appreciated.  Skin:  Skin is warm, dry and intact. No rash noted. Psychiatric: Mood and affect are normal. Speech and behavior are normal.  ____________________________________________   LABS (all labs ordered are listed, but only abnormal results are displayed)  Labs Reviewed  CBC WITH DIFFERENTIAL/PLATELET - Abnormal; Notable for the following:       Result Value   RBC 4.33 (*)    Neutro Abs 6.6 (*)    All other components within normal limits  COMPREHENSIVE METABOLIC PANEL - Abnormal; Notable for the following:    Glucose, Bld 103 (*)    BUN 26 (*)    ALT 14 (*)    GFR calc non Af Amer 50 (*)    GFR calc Af Amer 58 (*)    All other components within normal limits  TROPONIN I   ____________________________________________  EKG  ED ECG REPORT I, Lavonia Drafts, the attending physician, personally viewed and interpreted this ECG.  Date: 06/10/2017 EKG Time: 7:23 AM Rate: 82 Rhythm: normal sinus rhythm QRS Axis: Left axis deviation Intervals: normal ST/T Wave abnormalities:  normal   ____________________________________________  RADIOLOGY  None ____________________________________________   PROCEDURES  Procedure(s) performed: No    Critical Care performed: No ____________________________________________   INITIAL IMPRESSION / ASSESSMENT AND PLAN / ED COURSE  Pertinent labs & imaging results that were available during my care of the patient were reviewed by me and considered in my medical decision making (see chart for details).  Patient treated with IV fluids and 25 mg of by mouth meclizine. Lab work is overall reassuring.  ----------------------------------------- 9:33 AM on 06/10/2017 -----------------------------------------  Patient is able to ambulate well with no symptoms. He feels much better and is happy to go home. Return precautions discussed.    ____________________________________________   FINAL CLINICAL IMPRESSION(S) / ED DIAGNOSES  Final diagnoses:  Vertigo      NEW MEDICATIONS STARTED DURING THIS VISIT:  New Prescriptions   MECLIZINE (ANTIVERT) 25 MG TABLET  Take 1 tablet (25 mg total) by mouth 3 (three) times daily as needed for dizziness.     Note:  This document was prepared using Dragon voice recognition software and may include unintentional dictation errors.    Lavonia Drafts, MD 06/10/17 [phone]

## 2017-06-10 NOTE — ED Notes (Signed)
AAOx3.  Skin warm and dry.  NAD 

## 2017-06-10 NOTE — Telephone Encounter (Signed)
Currently at ER.

## 2017-06-10 NOTE — ED Notes (Signed)
Ambulated in room.  Patient gait steady.  Denies c/o Dizziness.  Tolerated well.

## 2017-06-10 NOTE — ED Notes (Signed)
AAOx3.  Skin warm and dry.  Stood at side of bed to void.  Tolerated well.  States dizziness is less.

## 2017-06-10 NOTE — ED Triage Notes (Signed)
Pt c/o having intermittent dizziness yesterday and again this morning when he was getting up. Denies any pain or visual changes.. Pt is a/ox4

## 2017-06-15 ENCOUNTER — Other Ambulatory Visit: Payer: Self-pay | Admitting: Family Medicine

## 2017-06-23 ENCOUNTER — Encounter: Payer: Self-pay | Admitting: Family Medicine

## 2017-06-23 ENCOUNTER — Ambulatory Visit (INDEPENDENT_AMBULATORY_CARE_PROVIDER_SITE_OTHER): Payer: Medicare Other | Admitting: Family Medicine

## 2017-06-23 VITALS — BP 130/82 | HR 81 | Temp 97.6°F | Wt 153.8 lb

## 2017-06-23 DIAGNOSIS — Z23 Encounter for immunization: Secondary | ICD-10-CM

## 2017-06-23 DIAGNOSIS — H8110 Benign paroxysmal vertigo, unspecified ear: Secondary | ICD-10-CM | POA: Diagnosis not present

## 2017-06-23 DIAGNOSIS — N3000 Acute cystitis without hematuria: Secondary | ICD-10-CM | POA: Diagnosis not present

## 2017-06-23 LAB — POC URINALSYSI DIPSTICK (AUTOMATED)
BILIRUBIN UA: NEGATIVE
Glucose, UA: NEGATIVE
Ketones, UA: NEGATIVE
NITRITE UA: NEGATIVE
PH UA: 6 (ref 5.0–8.0)
Protein, UA: NEGATIVE
RBC UA: NEGATIVE
SPEC GRAV UA: 1.015 (ref 1.010–1.025)
Urobilinogen, UA: 0.2 E.U./dL

## 2017-06-23 MED ORDER — CEPHALEXIN 500 MG PO CAPS: 500.0000 mg | ORAL_CAPSULE | Freq: Two times a day (BID) | ORAL | 0 refills | Status: DC

## 2017-06-23 NOTE — Patient Instructions (Addendum)
Flu shot today. I think you may have urine infection - treat with antibiotic sent to pharmacy (keflex).  Continue to stay well hydrated.  Let us know if not improving with treatment.  Decrease aspirin to Monday Wednesday Friday dosing.

## 2017-06-23 NOTE — Progress Notes (Signed)
BP 130/82 (BP Location: Left Arm, Patient Position: Sitting, Cuff Size: Normal)   Pulse 81   Temp 97.6 F (36.4 C) (Oral)   Wt 153 lb 12 oz (69.7 kg)   SpO2 96%   BMI 22.70 kg/m    CC: urinary frequency, fatigue Subjective:    Patient ID: Harold Chavez, male    DOB: [birth year], 83 y.o.   MRN: [id number]  HPI: Harold Chavez is a 83 y.o. male presenting on 06/23/2017 for Urinary Frequency (Last couple of days. Nocturia for the last few yrs) and Fatigue   Here with son.   Seen at ER earlier in the month for vertigo. Treated with meclizine and IV fluids. PO meclizine at home is helpful.   Over last few days noticing increased daytime voiding with some urgency worse when first standing after prolonged sitting. Longstanding nocturia. He has increased fluid intake. Denies dysuria, or abdominal discomfort, hematuria, fevers/chills, nausea or vomiting or flank pain. Increasing fatigue as well noted by son. He feels he's completely emptying urine.   Relevant past medical, surgical, family and social history reviewed and updated as indicated. Interim medical history since our last visit reviewed. Allergies and medications reviewed and updated. Outpatient Medications Prior to Visit  Medication Sig Dispense Refill  . aspirin 81 MG tablet Take 81 mg by mouth every Monday, Wednesday, and Friday.     Marland Kitchen atenolol (TENORMIN) 25 MG tablet TAKE 1 TABLET (25 MG TOTAL) BY MOUTH DAILY. 30 tablet 5  . Cyanocobalamin (B-12 PO) Take 1 tablet by mouth daily.    . meclizine (ANTIVERT) 25 MG tablet Take 1 tablet (25 mg total) by mouth 3 (three) times daily as needed for dizziness. 20 tablet 0   No facility-administered medications prior to visit.      Per HPI unless specifically indicated in ROS section below Review of Systems     Objective:    BP 130/82 (BP Location: Left Arm, Patient Position: Sitting, Cuff Size: Normal)   Pulse 81   Temp 97.6 F (36.4 C) (Oral)   Wt 153 lb 12 oz (69.7 kg)    SpO2 96%   BMI 22.70 kg/m   Wt Readings from Last 3 Encounters:  06/23/17 153 lb 12 oz (69.7 kg)  06/10/17 150 lb (68 kg)  02/25/17 152 lb 3 oz (69 kg)    Physical Exam  Constitutional: He appears well-developed and well-nourished. No distress.  HENT:  Mouth/Throat: Oropharynx is clear and moist. No oropharyngeal exudate.  Cardiovascular: Normal rate, regular rhythm, normal heart sounds and intact distal pulses.   No murmur heard. Pulmonary/Chest: Effort normal and breath sounds normal. No respiratory distress. He has no wheezes. He has no rales.  Abdominal: Soft. Bowel sounds are normal. He exhibits no distension and no mass. There is no tenderness. There is no rebound, no guarding and no CVA tenderness.  Musculoskeletal: He exhibits no edema.  Skin: Skin is warm and dry. No rash noted.  Psychiatric: He has a normal mood and affect.  Nursing note and vitals reviewed.  Results for orders placed or performed in visit on 06/23/17  POCT Urinalysis Dipstick (Automated)  Result Value Ref Range   Color, UA yellow    Clarity, UA cloudy    Glucose, UA negative    Bilirubin, UA negative    Ketones, UA negative    Spec Grav, UA 1.015 1.010 - 1.025   Blood, UA negative    pH, UA 6.0 5.0 - 8.0   Protein,  UA negative    Urobilinogen, UA 0.2 0.2 or 1.0 E.U./dL   Nitrite, UA negative    Leukocytes, UA Moderate (2+) (A) Negative      Assessment & Plan:   Problem List Items Addressed This Visit    Acute cystitis without hematuria - Primary    Urinary frequency, urgency, increased fatigue and UA with LE. UCx sent. Anticipate acute cystitis. Treat with 7d keflex course for this elderly male patient.       Relevant Orders   Urine Culture   POCT Urinalysis Dipstick (Automated) (Completed)   BENIGN POSITIONAL VERTIGO    H/o this.  Reviewed recent ER visit thought peripheral vertigo related dizziness, treated with meclizine. Reviewed meclizine use.        Other Visit Diagnoses    Need  for influenza vaccination       Relevant Orders   Flu Vaccine QUAD 6+ mos PF IM (Fluarix Quad PF) (Completed)       Follow up plan: Return if symptoms worsen or fail to improve.  Ria Bush, MD

## 2017-06-24 LAB — URINE CULTURE
MICRO NUMBER: 81054316
RESULT: NO GROWTH
SPECIMEN QUALITY:: ADEQUATE

## 2017-06-24 NOTE — Assessment & Plan Note (Signed)
H/o this.  Reviewed recent ER visit thought peripheral vertigo related dizziness, treated with meclizine. Reviewed meclizine use.

## 2017-06-24 NOTE — Assessment & Plan Note (Signed)
Urinary frequency, urgency, increased fatigue and UA with LE. UCx sent. Anticipate acute cystitis. Treat with 7d keflex course for this elderly male patient.

## 2017-08-18 DIAGNOSIS — D229 Melanocytic nevi, unspecified: Secondary | ICD-10-CM | POA: Diagnosis not present

## 2017-08-18 DIAGNOSIS — L814 Other melanin hyperpigmentation: Secondary | ICD-10-CM | POA: Diagnosis not present

## 2017-08-18 DIAGNOSIS — L821 Other seborrheic keratosis: Secondary | ICD-10-CM | POA: Diagnosis not present

## 2017-08-18 DIAGNOSIS — L570 Actinic keratosis: Secondary | ICD-10-CM | POA: Diagnosis not present

## 2017-08-18 DIAGNOSIS — Z85828 Personal history of other malignant neoplasm of skin: Secondary | ICD-10-CM | POA: Diagnosis not present

## 2017-08-18 DIAGNOSIS — Z85820 Personal history of malignant melanoma of skin: Secondary | ICD-10-CM | POA: Diagnosis not present

## 2017-08-28 ENCOUNTER — Inpatient Hospital Stay: Payer: Medicare Other | Attending: Hematology and Oncology

## 2017-08-28 ENCOUNTER — Encounter: Payer: Self-pay | Admitting: Hematology and Oncology

## 2017-08-28 ENCOUNTER — Inpatient Hospital Stay (HOSPITAL_BASED_OUTPATIENT_CLINIC_OR_DEPARTMENT_OTHER): Payer: Medicare Other | Admitting: Hematology and Oncology

## 2017-08-28 ENCOUNTER — Other Ambulatory Visit: Payer: Self-pay

## 2017-08-28 VITALS — BP 117/72 | HR 80 | Temp 97.3°F | Resp 16 | Ht 71.0 in | Wt 157.2 lb

## 2017-08-28 DIAGNOSIS — D472 Monoclonal gammopathy: Secondary | ICD-10-CM

## 2017-08-28 DIAGNOSIS — H8110 Benign paroxysmal vertigo, unspecified ear: Secondary | ICD-10-CM | POA: Insufficient documentation

## 2017-08-28 DIAGNOSIS — Z79899 Other long term (current) drug therapy: Secondary | ICD-10-CM

## 2017-08-28 DIAGNOSIS — N189 Chronic kidney disease, unspecified: Secondary | ICD-10-CM | POA: Diagnosis not present

## 2017-08-28 DIAGNOSIS — D869 Sarcoidosis, unspecified: Secondary | ICD-10-CM | POA: Insufficient documentation

## 2017-08-28 DIAGNOSIS — H905 Unspecified sensorineural hearing loss: Secondary | ICD-10-CM | POA: Insufficient documentation

## 2017-08-28 DIAGNOSIS — Z7982 Long term (current) use of aspirin: Secondary | ICD-10-CM | POA: Diagnosis not present

## 2017-08-28 DIAGNOSIS — D519 Vitamin B12 deficiency anemia, unspecified: Secondary | ICD-10-CM | POA: Diagnosis not present

## 2017-08-28 DIAGNOSIS — C9110 Chronic lymphocytic leukemia of B-cell type not having achieved remission: Secondary | ICD-10-CM

## 2017-08-28 DIAGNOSIS — Z85828 Personal history of other malignant neoplasm of skin: Secondary | ICD-10-CM | POA: Insufficient documentation

## 2017-08-28 LAB — CBC WITH DIFFERENTIAL/PLATELET
Basophils Absolute: 0 10*3/uL (ref 0–0.1)
Basophils Relative: 1 %
Eosinophils Absolute: 0.1 10*3/uL (ref 0–0.7)
Eosinophils Relative: 1 %
HCT: 41.2 % (ref 40.0–52.0)
Hemoglobin: 13.9 g/dL (ref 13.0–18.0)
Lymphocytes Relative: 20 %
Lymphs Abs: 1.7 10*3/uL (ref 1.0–3.6)
MCH: 31.8 pg (ref 26.0–34.0)
MCHC: 33.6 g/dL (ref 32.0–36.0)
MCV: 94.6 fL (ref 80.0–100.0)
Monocytes Absolute: 0.5 10*3/uL (ref 0.2–1.0)
Monocytes Relative: 6 %
Neutro Abs: 6.2 10*3/uL (ref 1.4–6.5)
Neutrophils Relative %: 72 %
Platelets: 256 10*3/uL (ref 150–440)
RBC: 4.36 MIL/uL — ABNORMAL LOW (ref 4.40–5.90)
RDW: 13.9 % (ref 11.5–14.5)
WBC: 8.6 10*3/uL (ref 3.8–10.6)

## 2017-08-28 LAB — BASIC METABOLIC PANEL
Anion gap: 7 (ref 5–15)
BUN: 34 mg/dL — ABNORMAL HIGH (ref 6–20)
CO2: 29 mmol/L (ref 22–32)
Calcium: 9.2 mg/dL (ref 8.9–10.3)
Chloride: 104 mmol/L (ref 101–111)
Creatinine, Ser: 1.47 mg/dL — ABNORMAL HIGH (ref 0.61–1.24)
GFR calc Af Amer: 46 mL/min — ABNORMAL LOW (ref >60)
GFR calc non Af Amer: 40 mL/min — ABNORMAL LOW (ref >60)
Glucose, Bld: 104 mg/dL — ABNORMAL HIGH (ref 65–99)
Potassium: 4.5 mmol/L (ref 3.5–5.1)
Sodium: 140 mmol/L (ref 135–145)

## 2017-08-28 NOTE — Progress Notes (Signed)
Patient here for follow up.No changes since his last appointment. 

## 2017-08-28 NOTE — Progress Notes (Signed)
[facility] day:  08/28/17  Chief Complaint: Harold Chavez is a 83 y.o. male with CLL who is seen for 6 month assessment.  HPI: The patient was last seen in the medical oncology clinic on 02/25/2017.  At that time, he was doing well.  He denied any B symptoms.  Exam revealed no adenopathy or hepatosplenomegaly.  Counts were stable.  Labs included a hematocrit of 40.8, hemoglobin 14.2, MCV 93.3, platelets 249,000, [id number] with an [place] of 5300.   He was seen in the [facility] ER on 06/10/2017 with vertigo.  He received IVF and was prescribed meclizine.  He was seen by Dr. Danise Mina on 06/23/2017 with fatigue and urinary frequency.  He was diagnosed with cystitis and prescribed Keflex x 7 days.  He received the influenza vaccine.  During the interim, patient has been doing "good". Patient denies fevers, sweats, bruising, and bleeding. Patient remains active. He notes that he tries to exercise and work in yard daily. He states, "I have a routine. I work with my resistance bands". Patient is eating well and gaining weight appropriately. His weight is up 4 pounds since his last visit.   Patient recently had 5 lesions removed by dermatology from his RIGHT ear and from his nose.     Past Medical History:  Diagnosis Date  . B12 deficiency anemia   . BCC (basal cell carcinoma of skin) 2017   L frontal scalp  . Benign paroxysmal positional vertigo   . C. difficile diarrhea 07/14/2014   2015   . Chronic kidney disease   . History of pneumonia 07/14/2014   2015   . History of shingles 2010  . IgG gammopathy   . 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 Leukemia ([place])    CLL  . 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 Sarcoidosis   . 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 Squamous cell carcinoma in situ of skin 2017   R forearm, nose, R cheek  - [place]    Past Surgical History:  Procedure Laterality Date  . MOHS SURGERY  2015   R cheek lentigo maligna in situ  . TESTICLE REMOVAL  1937   right; undescended    Family History  Problem Relation Age of Onset  . Stroke Father     Social History:  reports that  has never smoked. he has never used smokeless tobacco. He reports that he does not drink alcohol or use drugs.  The patient is accompanied by his son, [name],  today.  Allergies: No Known Allergies  Current Medications: Current Outpatient Medications  Medication Sig Dispense Refill  . aspirin 81 MG tablet Take 81 mg by mouth every Monday, Wednesday, and Friday.     Marland Kitchen atenolol (TENORMIN) 25 MG tablet TAKE 1 TABLET (25 MG TOTAL) BY MOUTH DAILY. 30 tablet 5  . Cyanocobalamin (B-12 PO) Take 1 tablet by mouth daily.    . meclizine (ANTIVERT) 25 MG tablet Take 1 tablet (25 mg total) by mouth 3 (three) times daily as needed for dizziness. 20 tablet 0   No current facility-administered medications for this visit.     Review of Systems:  GENERAL:  Feels "pretty good".  No fevers or sweats.  Weight up 4 pounds. PERFORMANCE STATUS (ECOG):  1 HEENT:  No visual changes, runny nose,  sore throat, mouth sores or tenderness. Lungs: No shortness of breath or cough.  No hemoptysis. Cardiac:  No chest pain, palpitations, orthopnea, or PND. GI:  No nausea, vomiting, diarrhea, constipation, melena or hematochezia. GU:  Nocturia (no change).  No urgency, dysuria, or hematuria. Musculoskeletal:  No bone pain.  No joint pain.  No muscle tenderness. Extremities:  Ankle edema. No pain. Skin:  Multiple skin chancers removed by dermatology.  No rashes or skin changes. Neuro:  No headache, numbness or weakness, balance or coordination issues. Endocrine:  No diabetes, thyroid issues, hot flashes or night sweats. Psych:  No mood changes, depression or anxiety. Pain:  No focal pain. Review of systems:  All other systems reviewed and found to  be negative.  Physical Exam: Blood pressure 117/72, pulse 80, temperature (!) 97.3 F (36.3 C), temperature source Tympanic, resp. rate 16, height '5\' 11"'  (1.803 m), weight 157 lb 3.2 oz (71.3 kg), SpO2 96 %. GENERAL:  Thin elderly gentleman sitting comfortably in the exam room in no acute distress. MENTAL STATUS:  Alert and oriented to person, place and time. HEAD:  Short gray hair.  Normocephalic, atraumatic, face symmetric, no Cushingoid features. EYES:  Glasses.  Blue eyes.  Pupils equal round and reactive to light and accomodation.  No conjunctivitis or scleral icterus. ENT:  Oropharynx clear without lesion.  Tongue normal. Mucous membranes moist.  RESPIRATORY:  Clear to auscultation without rales, wheezes or rhonchi. CARDIOVASCULAR:  Regular rate and rhythm without murmur, rub or gallop. ABDOMEN:  Soft, non-tender, with active bowel sounds, and no hepatosplenomegaly.  No masses. SKIN:  Small eschars right ear, nose and face.  No rashes, ulcers or lesions. EXTREMITIES: 4-5 cm cyst right elbow cyst (old).  Bilateral ankle edema (left > right). No skin discoloration or tenderness.  No palpable cords. LYMPH NODES: No palpable cervical, supraclavicular, axillary or inguinal adenopathy  NEUROLOGICAL: Unremarkable. PSYCH:  Appropriate.   Appointment on 08/28/2017  Component Date Value Ref Range Status  . WBC 08/28/2017 8.6  3.8 - 10.6 K/uL Final  . RBC 08/28/2017 4.36* 4.40 - 5.90 MIL/uL Final  . Hemoglobin 08/28/2017 13.9  13.0 - 18.0 g/dL Final  . HCT 08/28/2017 41.2  40.0 - 52.0 % Final  . MCV 08/28/2017 94.6  80.0 - 100.0 fL Final  . MCH 08/28/2017 31.8  26.0 - 34.0 pg Final  . MCHC 08/28/2017 33.6  32.0 - 36.0 g/dL Final  . RDW 08/28/2017 13.9  11.5 - 14.5 % Final  . Platelets 08/28/2017 256  150 - 440 K/uL Final  . Neutrophils Relative % 08/28/2017 72  % Final  . Neutro Abs 08/28/2017 6.2  1.4 - 6.5 K/uL Final  . Lymphocytes Relative 08/28/2017 20  % Final  . Lymphs Abs  08/28/2017 1.7  1.0 - 3.6 K/uL Final  . Monocytes Relative 08/28/2017 6  % Final  . Monocytes Absolute 08/28/2017 0.5  0.2 - 1.0 K/uL Final  . Eosinophils Relative 08/28/2017 1  % Final  . Eosinophils Absolute 08/28/2017 0.1  0 - 0.7 K/uL Final  . Basophils Relative 08/28/2017 1  % Final  . Basophils Absolute 08/28/2017 0.0  0 - 0.1 K/uL Final  . Sodium 08/28/2017 140  135 - 145 mmol/L Final  . Potassium 08/28/2017 4.5  3.5 - 5.1 mmol/L Final  . Chloride 08/28/2017 104  101 - 111 mmol/L Final  . CO2 08/28/2017 29  22 - 32 mmol/L Final  . Glucose, Bld 08/28/2017 104* 65 - 99 mg/dL Final  .  BUN 08/28/2017 34* 6 - 20 mg/dL Final  . Creatinine, Ser 08/28/2017 1.47* 0.61 - 1.24 mg/dL Final  . Calcium 08/28/2017 9.2  8.9 - 10.3 mg/dL Final  . GFR calc non Af Amer 08/28/2017 40* >60 mL/min Final  . GFR calc Af Amer 08/28/2017 46* >60 mL/min Final   Comment: (NOTE) The eGFR has been calculated using the CKD EPI equation. This calculation has not been validated in all clinical situations. eGFR's persistently <60 mL/min signify possible Chronic Kidney Disease.   . Anion gap 08/28/2017 7  5 - 15 Final    Assessment:  Harold Chavez is a 83 y.o. male with chronic lymphocytic leukemia (CLL).  He presented in 10/2013 with dehydration and syncope.  Evaluation revealed axillary adenopathy.    He received 5 of 6 cycles of planned bendamustine and Rituxan (12/17/2013 - 04/27/2014).  With cycle #5 he developed neutropenia and received GCSF.  He was subsequently admitted with sepsis and pneumonia.  Course was complicated by C diff.  Decision was made to defer his last cycle of chemotherapy.  He has a monoclonal gammopathy of unclear significance.  IgG was 1101 on 08/19/2014.  Review of prior labs noted no M-spike, but elevated kappa light chains on 12/01/2013.  Kappa free light chains were 677.68 (high), lambda free light chains 453.8 (high), and ratio 1.49 (normal) on 12/01/2013.  Kappa free light chains  were 34.64 (high), lambda free light chains 22.11 (high), with a ratio 1.57 (normal) on 11/17/2014.   Symptomatically, he is doing well.  He denies any B symptoms.  Exam reveals no adenopathy or hepatosplenomegaly.  Counts are stable.  He has mild renal insufficiency (BUN 34; Cr 1.47).  Plan: 1.  Labs today: CBC with diff, CMP.  2.  RTC in 6 months for MD assessment and labs (CBC, BMP).   Honor Loh, NP  08/28/2017, 10:26 AM    I saw and evaluated the patient, participating in the key portions of the service and reviewing pertinent diagnostic studies and records.  I reviewed the nurse practitioner's note and agree with the findings and the plan.  The assessment and plan were discussed with the patient.  A few questions were asked by the patient and answered.   Nolon Stalls, MD 08/28/2017,10:26 AM

## 2017-10-30 ENCOUNTER — Other Ambulatory Visit: Payer: Self-pay | Admitting: Family Medicine

## 2017-10-30 DIAGNOSIS — D531 Other megaloblastic anemias, not elsewhere classified: Secondary | ICD-10-CM

## 2017-10-30 DIAGNOSIS — C9110 Chronic lymphocytic leukemia of B-cell type not having achieved remission: Secondary | ICD-10-CM

## 2017-10-30 DIAGNOSIS — N183 Chronic kidney disease, stage 3 unspecified: Secondary | ICD-10-CM

## 2017-11-03 ENCOUNTER — Ambulatory Visit (INDEPENDENT_AMBULATORY_CARE_PROVIDER_SITE_OTHER): Payer: Medicare Other

## 2017-11-03 VITALS — BP 112/78 | HR 95 | Temp 97.8°F | Ht 68.5 in | Wt 155.8 lb

## 2017-11-03 DIAGNOSIS — D531 Other megaloblastic anemias, not elsewhere classified: Secondary | ICD-10-CM | POA: Diagnosis not present

## 2017-11-03 DIAGNOSIS — Z0000 Encounter for general adult medical examination without abnormal findings: Secondary | ICD-10-CM

## 2017-11-03 DIAGNOSIS — N183 Chronic kidney disease, stage 3 unspecified: Secondary | ICD-10-CM

## 2017-11-03 LAB — RENAL FUNCTION PANEL
Albumin: 4.4 g/dL (ref 3.5–5.2)
BUN: 27 mg/dL — ABNORMAL HIGH (ref 6–23)
CALCIUM: 9.6 mg/dL (ref 8.4–10.5)
CHLORIDE: 104 meq/L (ref 96–112)
CO2: 29 meq/L (ref 19–32)
CREATININE: 1.43 mg/dL (ref 0.40–1.50)
GFR: 49.22 mL/min — AB (ref >60.00)
Glucose, Bld: 92 mg/dL (ref 70–99)
PHOSPHORUS: 3.2 mg/dL (ref 2.3–4.6)
POTASSIUM: 5.4 meq/L — AB (ref 3.5–5.1)
Sodium: 140 mEq/L (ref 135–145)

## 2017-11-03 LAB — MICROALBUMIN / CREATININE URINE RATIO
CREATININE, U: 110.1 mg/dL
MICROALB UR: 11.3 mg/dL — AB (ref 0.0–1.9)
MICROALB/CREAT RATIO: 10.3 mg/g (ref 0.0–30.0)

## 2017-11-03 LAB — VITAMIN B12: Vitamin B-12: >1500 pg/mL — ABNORMAL HIGH (ref 211–911)

## 2017-11-03 NOTE — Patient Instructions (Addendum)
Harold Chavez , Thank you for taking time to come for your Medicare Wellness Visit. I appreciate your ongoing commitment to your health goals. Please review the following plan we discussed and let me know if I can assist you in the future.   These are the goals we discussed: Goals    . Increase physical activity     Starting 11/03/2017, I will continue to exercise for at least 30 minutes daily.        This is a list of the screening recommended for you and due dates:  Health Maintenance  Topic Date Due  . DTaP/Tdap/Td vaccine (1 - Tdap) 11/03/2018*  . Tetanus Vaccine  11/03/2018*  . Urine Protein Check  11/03/2018  . Flu Shot  Completed  . Pneumonia vaccines  Completed  *Topic was postponed. The date shown is not the original due date.   Preventive Care for Adults  A healthy lifestyle and preventive care can promote health and wellness. Preventive health guidelines for adults include the following key practices.  . A routine yearly physical is a good way to check with your health care provider about your health and preventive screening. It is a chance to share any concerns and updates on your health and to receive a thorough exam.  . Visit your dentist for a routine exam and preventive care every 6 months. Brush your teeth twice a day and floss once a day. Good oral hygiene prevents tooth decay and gum disease.  . The frequency of eye exams is based on your age, health, family medical history, use  of contact lenses, and other factors. Follow your health care provider's recommendations for frequency of eye exams.  . Eat a healthy diet. Foods like vegetables, fruits, whole grains, low-fat dairy products, and lean protein foods contain the nutrients you need without too many calories. Decrease your intake of foods high in solid fats, added sugars, and salt. Eat the right amount of calories for you. Get information about a proper diet from your health care provider, if necessary.  . Regular  physical exercise is one of the most important things you can do for your health. Most adults should get at least 150 minutes of moderate-intensity exercise (any activity that increases your heart rate and causes you to sweat) each week. In addition, most adults need muscle-strengthening exercises on 2 or more days a week.  Silver Sneakers may be a benefit available to you. To determine eligibility, you may visit the website: www.silversneakers.com or contact program at (915) 844-6442 Mon-Fri between 8AM-8PM.   . Maintain a healthy weight. The body mass index (BMI) is a screening tool to identify possible weight problems. It provides an estimate of body fat based on height and weight. Your health care provider can find your BMI and can help you achieve or maintain a healthy weight.   For adults 20 years and older: ? A BMI below 18.5 is considered underweight. ? A BMI of 18.5 to 24.9 is normal. ? A BMI of 25 to 29.9 is considered overweight. ? A BMI of 30 and above is considered obese.   . Maintain normal blood lipids and cholesterol levels by exercising and minimizing your intake of saturated fat. Eat a balanced diet with plenty of fruit and vegetables. Blood tests for lipids and cholesterol should begin at age 79 and be repeated every 5 years. If your lipid or cholesterol levels are high, you are over 50, or you are at high risk for heart disease, you  may need your cholesterol levels checked more frequently. Ongoing high lipid and cholesterol levels should be treated with medicines if diet and exercise are not working.  . If you smoke, find out from your health care provider how to quit. If you do not use tobacco, please do not start.  . If you choose to drink alcohol, please do not consume more than 2 drinks per day. One drink is considered to be 12 ounces (355 mL) of beer, 5 ounces (148 mL) of wine, or 1.5 ounces (44 mL) of liquor.  . If you are 43-79 years old, ask your health care provider if  you should take aspirin to prevent strokes.  . Use sunscreen. Apply sunscreen liberally and repeatedly throughout the day. You should seek shade when your shadow is shorter than you. Protect yourself by wearing long sleeves, pants, a wide-brimmed hat, and sunglasses year round, whenever you are outdoors.  . Once a month, do a whole body skin exam, using a mirror to look at the skin on your back. Tell your health care provider of new moles, moles that have irregular borders, moles that are larger than a pencil eraser, or moles that have changed in shape or color.

## 2017-11-03 NOTE — Progress Notes (Signed)
PCP notes:   Health maintenance:  Tetanus vaccine - postponed/insurance Microalbumin - completed  Abnormal screenings:   Hearing - failed  Hearing Screening   125Hz  250Hz  500Hz  1000Hz  2000Hz  3000Hz  4000Hz  6000Hz  8000Hz   Right ear:   0 0 0  0    Left ear:   0 0 0  0      Patient concerns:   None  Nurse concerns:  None  Next PCP appt:   11/06/17 @ 1030

## 2017-11-03 NOTE — Progress Notes (Signed)
Pre visit review using our clinic review tool, if applicable. No additional management support is needed unless otherwise documented below in the visit note. 

## 2017-11-03 NOTE — Progress Notes (Signed)
Subjective:   Harold Chavez is a 84 y.o. male who presents for Medicare Annual/Subsequent preventive examination.  Review of Systems:  N/A Cardiac Risk Factors include: advanced age (>56men, >76 women);male gender     Objective:    Vitals: BP 112/78 (BP Location: Left Arm, Patient Position: Sitting, Cuff Size: Normal)   Pulse 95   Temp 97.8 F (36.6 C) (Oral)   Ht 5' 8.5" (1.74 m) Comment: no shoes  Wt 155 lb 12 oz (70.6 kg)   SpO2 99%   BMI 23.34 kg/m   Body mass index is 23.34 kg/m.  Advanced Directives 11/03/2017 08/28/2017 06/10/2017 02/25/2017 10/11/2016 08/26/2016 02/19/2016  Does Patient Have a Medical Advance Directive? Yes Yes No Yes Yes Yes Yes  Type of Paramedic of [place];Living will [place];Living will - - [place];Living will - Living will;Healthcare Power of Attorney  Does patient want to make changes to medical advance directive? - No - Patient declined - - - - -  Copy of [place] in Chart? No - copy requested No - copy requested - - No - copy requested - No - copy requested  Would patient like information on creating a medical advance directive? - - No - Patient declined - - - -    Tobacco Social History   Tobacco Use  Smoking Status Never Smoker  Smokeless Tobacco Never Used     Counseling given: No   Clinical Intake:  Pre-visit preparation completed: Yes  Pain : No/denies pain Pain Score: 0-No pain     Nutritional Status: BMI of 19-24  Normal Nutritional Risks: None Diabetes: No  How often do you need to have someone help you when you read instructions, pamphlets, or other written materials from your doctor or pharmacy?: 2 - Rarely What is the last grade level you completed in school?: High school diploma  Interpreter Needed?: No  Comments: pt is a widower; pt and son [name] live together Information entered by :: [employer], LPN  Past Medical History:    Diagnosis Date  . B12 deficiency anemia   . BCC (basal cell carcinoma of skin) 2017   L frontal scalp  . Benign paroxysmal positional vertigo   . C. difficile diarrhea 07/14/2014   2015   . Chronic kidney disease   . History of pneumonia 07/14/2014   2015   . History of shingles 2010  . IgG gammopathy   . 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 Leukemia ([place])    CLL  . 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 Sarcoidosis   . Sensorineural hearing loss (SNHL) of both ears 10/2015   haering aide eval rec through [facility])  . Squamous cell carcinoma in situ of skin 2017   R forearm, nose, R cheek - [place]   Past Surgical History:  Procedure Laterality Date  . MOHS SURGERY  2015   R cheek lentigo maligna in situ  . TESTICLE REMOVAL  1937   right; undescended   Family History  Problem Relation Age of Onset  . Stroke Father    Social History   Socioeconomic History  . Marital status: Married    Spouse name: None  . Number of children: None  . Years of education: None  . Highest education level: None  Social Needs  . Financial resource strain: None  . Food insecurity - worry: None  .  Food insecurity - inability: None  . Transportation needs - medical: None  . Transportation needs - non-medical: None  Occupational History  . None  Tobacco Use  . Smoking status: Never Smoker  . Smokeless tobacco: Never Used  Substance and Sexual Activity  . Alcohol use: No  . Drug use: No  . Sexual activity: No  Other Topics Concern  . 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 aspirin 81 MG tablet Take 81 mg by mouth every Monday, Wednesday, and Friday.   Marland Kitchen atenolol (TENORMIN) 25 MG tablet TAKE 1  TABLET (25 MG TOTAL) BY MOUTH DAILY.  Marland Kitchen Cyanocobalamin (B-12 PO) Take 1 tablet by mouth daily.  . meclizine (ANTIVERT) 25 MG tablet Take 1 tablet (25 mg total) by mouth 3 (three) times daily as needed for dizziness.   No facility-administered encounter medications on file as of 11/03/2017.     Activities of Daily Living In your present state of health, do you have any difficulty performing the following activities: 11/03/2017  Hearing? Y  Vision? N  Difficulty concentrating or making decisions? N  Walking or climbing stairs? N  Dressing or bathing? N  Doing errands, shopping? Y  Preparing Food and eating ? N  Using the Toilet? N  In the past six months, have you accidently leaked urine? N  Do you have problems with loss of bowel control? N  Managing your Medications? Y  Managing your Finances? Y  Housekeeping or managing your Housekeeping? N  Some recent data might be hidden    Patient Care Team: Ria Bush, MD as PCP - General (Family Medicine) Bary Castilla, Forest Gleason, MD (General Surgery) Lequita Asal, MD as Referring Physician (Hematology and Oncology) Ralene Bathe, MD as Consulting Physician (Ophthalmology)   Assessment:   This is a routine wellness examination for [facility].  Exercise Activities and Dietary recommendations Current Exercise Habits: Home exercise routine, Type of exercise: stretching;calisthenics;Other - see comments(yard work), Time (Minutes): 30, Frequency (Times/Week): 7, Weekly Exercise (Minutes/Week): 210, Intensity: Mild, Exercise limited by: None identified  Goals    . Increase physical activity     Starting 11/03/2017, I will continue to exercise for at least 30 minutes daily.        Fall Risk Fall Risk  11/03/2017 10/11/2016 10/16/2015  Falls in the past year? No Yes No  Comment - pt had accidental fall in home -  Number falls in past yr: - 1 -  Injury with Fall? - No -   Depression Screen PHQ 2/9 Scores 11/03/2017 10/11/2016 10/16/2015    PHQ - 2 Score 0 0 0  PHQ- 9 Score 0 - -    Cognitive Function MMSE - Mini Mental State Exam 11/03/2017 10/11/2016  Orientation to time 5 5  Orientation to Place 5 5  Registration 3 3  Attention/ Calculation 0 3  Recall 3 0  Language- name 2 objects 0 0  Language- repeat 1 1  Language- follow 3 step command 3 3  Language- read & follow direction 0 0  Write a sentence 0 0  Copy design 0 0  Total score 20 20     PLEASE NOTE: A Mini-Cog screen was completed. Maximum score is 20. A value  of 0 denotes this part of [place] MMSE was not completed or the patient failed this part of the Mini-Cog screening.   Mini-Cog Screening Orientation to Time - Max 5 pts Orientation to Place - Max 5 pts Registration - Max 3 pts Recall - Max 3 pts Language Repeat - Max 1 pts Language Follow 3 Step Command - Max 3 pts     Immunization History  Administered Date(s) Administered  . Influenza, High Dose Seasonal PF 08/29/2015  . Influenza,inj,Quad PF,6+ Mos 06/23/2017  . Influenza-Unspecified 05/31/2014, 06/30/2016  . Pneumococcal Conjugate-13 10/16/2015  . Pneumococcal Polysaccharide-23 05/31/2014  . Td 03/02/2007    Screening Tests Health Maintenance  Topic Date Due  . DTaP/Tdap/Td (1 - Tdap) 11/03/2018 (Originally 03/03/2007)  . TETANUS/TDAP  11/03/2018 (Originally 03/01/2017)  . URINE MICROALBUMIN  11/03/2018  . INFLUENZA VACCINE  Completed  . PNA vac Low Risk Adult  Completed      Plan:     I have personally reviewed, addressed, and noted the following in the patient's chart:  A. Medical and social history B. Use of alcohol, tobacco or illicit drugs  C. Current medications and supplements D. Functional ability and status E.  Nutritional status F.  Physical activity G. Advance directives H. List of other physicians I.  Hospitalizations, surgeries, and ER visits in previous 12 months J.  [place] to include hearing, vision, cognitive, depression L. Referrals and  appointments - none  In addition, I have reviewed and discussed with patient certain preventive protocols, quality metrics, and best practice recommendations. A written personalized care plan for preventive services as well as general preventive health recommendations were provided to patient.  See attached scanned questionnaire for additional information.   Signed,   Lindell Noe, MHA, BS, LPN Health Coach

## 2017-11-06 ENCOUNTER — Ambulatory Visit (INDEPENDENT_AMBULATORY_CARE_PROVIDER_SITE_OTHER): Payer: Medicare Other | Admitting: Family Medicine

## 2017-11-06 ENCOUNTER — Encounter: Payer: Self-pay | Admitting: Family Medicine

## 2017-11-06 VITALS — BP 122/74 | HR 76 | Temp 97.9°F | Ht 69.0 in | Wt 157.8 lb

## 2017-11-06 DIAGNOSIS — N183 Chronic kidney disease, stage 3 unspecified: Secondary | ICD-10-CM

## 2017-11-06 DIAGNOSIS — C9190 Lymphoid leukemia, unspecified not having achieved remission: Secondary | ICD-10-CM

## 2017-11-06 DIAGNOSIS — H903 Sensorineural hearing loss, bilateral: Secondary | ICD-10-CM

## 2017-11-06 DIAGNOSIS — Z7189 Other specified counseling: Secondary | ICD-10-CM

## 2017-11-06 DIAGNOSIS — D531 Other megaloblastic anemias, not elsewhere classified: Secondary | ICD-10-CM | POA: Diagnosis not present

## 2017-11-06 DIAGNOSIS — C9110 Chronic lymphocytic leukemia of B-cell type not having achieved remission: Secondary | ICD-10-CM

## 2017-11-06 DIAGNOSIS — E875 Hyperkalemia: Secondary | ICD-10-CM | POA: Insufficient documentation

## 2017-11-06 DIAGNOSIS — Z85820 Personal history of malignant melanoma of skin: Secondary | ICD-10-CM

## 2017-11-06 MED ORDER — CYANOCOBALAMIN 500 MCG PO TABS: 500.0000 ug | ORAL_TABLET | Freq: Every day | ORAL | Status: DC

## 2017-11-06 NOTE — Assessment & Plan Note (Signed)
Declined hearing aides at this time.

## 2017-11-06 NOTE — Assessment & Plan Note (Signed)
Appreciate onc care.  

## 2017-11-06 NOTE — Progress Notes (Signed)
BP 122/74 (BP Location: Left Arm, Patient Position: Sitting, Cuff Size: Normal)   Pulse 76   Temp 97.9 F (36.6 C) (Oral)   Ht 5\' 9"  (1.753 m)   Wt 157 lb 12 oz (71.6 kg)   SpO2 95%   BMI 23.30 kg/m    CC: AMW f/u visit Subjective:    Patient ID: Harold Chavez, male    DOB: 11-03-1923, 84 y.o.   MRN: [id number]  HPI: Harold Chavez is a 84 y.o. male presenting on 11/06/2017 for Annual Exam (Pt 2.)   Here with son today.   Saw Lesia earlier this week for medicare wellness visit. Note reviewed. Failed hearing screen. Has seen audiologist and decided against hearing aides.   Known small lymphocytic lymphoma and CLL followed by Dr Mike Gip [facility] ([place] Q6 months. S/p bendamustine and rituxan (2015). Did not receive 6th treatment due to PNA with sepsis and C diff hospitalization. Also with sarcoidosis, IgG gammopathy and B12 deficiency.   Preventative: Colon cancer screening - aged out Prostate cancer screening - aged out Lung cancer screening - aged out, not candidate Flu shot yearly  Tetanus shot - 2008 Pneumovax - 05/2014, prevnar 2017 zostavax - not a candidate shingrix - discussed  Advanced directive discussion - has at home. Son Harold Chavez is HCPOA. Asked to bring me copy. Seat belt use discussed Sunscreen use discussed. No changing moles on skin. 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 Interim medical history since our last visit reviewed. Allergies and medications reviewed and updated. Outpatient Medications Prior to Visit  Medication Sig Dispense Refill  . aspirin 81 MG tablet Take 81 mg by mouth every Monday, Wednesday, and Friday.     Marland Kitchen atenolol (TENORMIN) 25 MG tablet TAKE 1 TABLET (25 MG TOTAL) BY MOUTH DAILY. 30 tablet 5  .  meclizine (ANTIVERT) 25 MG tablet Take 1 tablet (25 mg total) by mouth 3 (three) times daily as needed for dizziness. 20 tablet 0  . Cyanocobalamin (B-12 PO) Take 1 tablet by mouth daily.     No facility-administered medications prior to visit.      Per HPI unless specifically indicated in ROS section below Review of Systems     Objective:    BP 122/74 (BP Location: Left Arm, Patient Position: Sitting, Cuff Size: Normal)   Pulse 76   Temp 97.9 F (36.6 C) (Oral)   Ht 5\' 9"  (1.753 m)   Wt 157 lb 12 oz (71.6 kg)   SpO2 95%   BMI 23.30 kg/m   Wt Readings from Last 3 Encounters:  11/06/17 157 lb 12 oz (71.6 kg)  11/03/17 155 lb 12 oz (70.6 kg)  08/28/17 157 lb 3.2 oz (71.3 kg)    Physical Exam  Constitutional: He is oriented to person, place, and time. He appears well-developed and well-nourished. No distress.  HENT:  Head: Normocephalic and atraumatic.  Right Ear: Hearing, tympanic membrane, external ear and ear canal normal.  Left Ear: Hearing, tympanic membrane, external ear and ear canal normal.  Nose: Nose normal.  Mouth/Throat: Uvula is midline, oropharynx is clear and moist and mucous membranes are normal. No oropharyngeal exudate, posterior oropharyngeal edema or posterior oropharyngeal erythema.  Eyes: Conjunctivae and EOM are normal. Pupils are equal, round,  and reactive to light. No scleral icterus.  Neck: Normal range of motion. Neck supple. Carotid bruit is not present. No thyromegaly present.  Cardiovascular: Normal rate, regular rhythm, normal heart sounds and intact distal pulses.  No murmur heard. Pulses:      Radial pulses are 2+ on the right side, and 2+ on the left side.  Pulmonary/Chest: Effort normal and breath sounds normal. No respiratory distress. He has no wheezes. He has no rales.  Abdominal: Soft. Bowel sounds are normal. He exhibits no distension and no mass. There is no tenderness. There is no rebound and no guarding.  Musculoskeletal: Normal range  of motion. He exhibits no edema.  Lymphadenopathy:    He has no cervical adenopathy.  Neurological: He is alert and oriented to person, place, and time.  CN grossly intact, station and gait intact  Skin: Skin is warm and dry. No rash noted.  Psychiatric: He has a normal mood and affect. His behavior is normal. Judgment and thought content normal.  Nursing note and vitals reviewed.  Results for orders placed or performed in visit on 11/03/17  Renal function panel  Result Value Ref Range   Sodium 140 135 - 145 mEq/L   Potassium 5.4 (H) 3.5 - 5.1 mEq/L   Chloride 104 96 - 112 mEq/L   CO2 29 19 - 32 mEq/L   Calcium 9.6 8.4 - 10.5 mg/dL   Albumin 4.4 3.5 - 5.2 g/dL   BUN 27 (H) 6 - 23 mg/dL   Creatinine, Ser 1.43 0.40 - 1.50 mg/dL   Glucose, Bld 92 70 - 99 mg/dL   Phosphorus 3.2 2.3 - 4.6 mg/dL   GFR 49.22 (L) >60.00 mL/min  Vitamin B12  Result Value Ref Range   Vitamin B-12 >1500 (H) 211 - 911 pg/mL  Microalbumin/Creatinine Ratio, Urine  Result Value Ref Range   Microalb, Ur 11.3 (H) 0.0 - 1.9 mg/dL   Creatinine,U 110.1 mg/dL   Microalb Creat Ratio 10.3 0.0 - 30.0 mg/g      Assessment & Plan:   Problem List Items Addressed This Visit    Advanced care planning/counseling discussion    Advanced directive discussion - has at home. Son Harold Chavez is HCPOA. Asked to bring me copy.      Chronic lymphocytic leukemia ([place])    Appreciate onc care.       CKD (chronic kidney disease) stage 3, GFR 30-59 ml/min (HCC) - Primary    Chronic, stable. Continue to monitor.       History of malignant melanoma    Sees derm regularly.      Hyperkalemia    New. First time elevated. Reviewed dietary options to lower potassium. He gets labwork routinely through onc - will review at next check.       Megaloblastic anemia due to B12 deficiency    b12 level elevated - suggested decrease dosing. They're unsure what dose he's currently taking.       Relevant Medications   cyanocobalamin (V-R  VITAMIN B-12) 500 MCG tablet   Sensorineural hearing loss (SNHL) of both ears    Declined hearing aides at this time.           Follow up plan: Return for annual exam, prior fasting for blood work.  Ria Bush, MD

## 2017-11-06 NOTE — Assessment & Plan Note (Signed)
Advanced directive discussion - has at home. Son [name] is HCPOA. Asked to bring me copy.

## 2017-11-06 NOTE — Assessment & Plan Note (Signed)
Chronic, stable. Continue to monitor.  

## 2017-11-06 NOTE — Assessment & Plan Note (Signed)
Sees derm regularly.

## 2017-11-06 NOTE — Assessment & Plan Note (Signed)
b12 level elevated - suggested decrease dosing. They're unsure what dose he's currently taking.

## 2017-11-06 NOTE — Patient Instructions (Addendum)
If interested, check with pharmacy about new 2 shot shingles series (shingrix). Check with Dr Mike Gip as well.  Bring me copy of your advanced directive  Decrease b12 dose a bit (562mcg) Decrease fruits and vegetables a little - as your potassium level was high today.  You are doing well today. Return as needed or in 1 year for next physical.

## 2017-11-06 NOTE — Assessment & Plan Note (Signed)
New. First time elevated. Reviewed dietary options to lower potassium. He gets labwork routinely through onc - will review at next check.

## 2017-11-06 NOTE — Progress Notes (Signed)
I reviewed health advisor's note, was available for consultation, and agree with documentation and plan.  

## 2017-12-17 ENCOUNTER — Other Ambulatory Visit: Payer: Self-pay | Admitting: Family Medicine

## 2017-12-17 DIAGNOSIS — H524 Presbyopia: Secondary | ICD-10-CM | POA: Diagnosis not present

## 2017-12-17 DIAGNOSIS — H2513 Age-related nuclear cataract, bilateral: Secondary | ICD-10-CM | POA: Diagnosis not present

## 2017-12-17 DIAGNOSIS — H25013 Cortical age-related cataract, bilateral: Secondary | ICD-10-CM | POA: Diagnosis not present

## 2017-12-17 DIAGNOSIS — H25043 Posterior subcapsular polar age-related cataract, bilateral: Secondary | ICD-10-CM | POA: Diagnosis not present

## 2018-02-11 DIAGNOSIS — L814 Other melanin hyperpigmentation: Secondary | ICD-10-CM | POA: Diagnosis not present

## 2018-02-11 DIAGNOSIS — L821 Other seborrheic keratosis: Secondary | ICD-10-CM | POA: Diagnosis not present

## 2018-02-11 DIAGNOSIS — D485 Neoplasm of uncertain behavior of skin: Secondary | ICD-10-CM | POA: Diagnosis not present

## 2018-02-11 DIAGNOSIS — Z85820 Personal history of malignant melanoma of skin: Secondary | ICD-10-CM | POA: Diagnosis not present

## 2018-02-11 DIAGNOSIS — D1801 Hemangioma of skin and subcutaneous tissue: Secondary | ICD-10-CM | POA: Diagnosis not present

## 2018-02-11 DIAGNOSIS — L989 Disorder of the skin and subcutaneous tissue, unspecified: Secondary | ICD-10-CM | POA: Diagnosis not present

## 2018-02-11 DIAGNOSIS — L570 Actinic keratosis: Secondary | ICD-10-CM | POA: Diagnosis not present

## 2018-02-11 DIAGNOSIS — Z85828 Personal history of other malignant neoplasm of skin: Secondary | ICD-10-CM | POA: Diagnosis not present

## 2018-02-26 ENCOUNTER — Other Ambulatory Visit: Payer: Self-pay

## 2018-02-26 ENCOUNTER — Inpatient Hospital Stay: Payer: Medicare Other | Attending: Hematology and Oncology

## 2018-02-26 ENCOUNTER — Inpatient Hospital Stay (HOSPITAL_BASED_OUTPATIENT_CLINIC_OR_DEPARTMENT_OTHER): Payer: Medicare Other | Admitting: Hematology and Oncology

## 2018-02-26 ENCOUNTER — Other Ambulatory Visit: Payer: Self-pay | Admitting: Hematology and Oncology

## 2018-02-26 ENCOUNTER — Encounter: Payer: Self-pay | Admitting: Hematology and Oncology

## 2018-02-26 VITALS — BP 129/77 | HR 67 | Temp 97.5°F | Resp 18 | Ht 69.0 in | Wt 165.0 lb

## 2018-02-26 DIAGNOSIS — C9110 Chronic lymphocytic leukemia of B-cell type not having achieved remission: Secondary | ICD-10-CM

## 2018-02-26 DIAGNOSIS — D472 Monoclonal gammopathy: Secondary | ICD-10-CM | POA: Diagnosis not present

## 2018-02-26 DIAGNOSIS — N189 Chronic kidney disease, unspecified: Secondary | ICD-10-CM | POA: Insufficient documentation

## 2018-02-26 DIAGNOSIS — C9190 Lymphoid leukemia, unspecified not having achieved remission: Secondary | ICD-10-CM

## 2018-02-26 LAB — BASIC METABOLIC PANEL
Anion gap: 9 (ref 5–15)
BUN: 28 mg/dL — ABNORMAL HIGH (ref 6–20)
CO2: 27 mmol/L (ref 22–32)
Calcium: 9.4 mg/dL (ref 8.9–10.3)
Chloride: 103 mmol/L (ref 101–111)
Creatinine, Ser: 1.5 mg/dL — ABNORMAL HIGH (ref 0.61–1.24)
GFR calc Af Amer: 45 mL/min — ABNORMAL LOW (ref >60)
GFR calc non Af Amer: 39 mL/min — ABNORMAL LOW (ref >60)
Glucose, Bld: 93 mg/dL (ref 65–99)
Potassium: 5 mmol/L (ref 3.5–5.1)
Sodium: 139 mmol/L (ref 135–145)

## 2018-02-26 LAB — CBC WITH DIFFERENTIAL/PLATELET
Basophils Absolute: 0 K/uL (ref 0–0.1)
Basophils Relative: 0 %
Eosinophils Absolute: 0.1 K/uL (ref 0–0.7)
Eosinophils Relative: 1 %
HCT: 40.2 % (ref 40.0–52.0)
Hemoglobin: 13.8 g/dL (ref 13.0–18.0)
Lymphocytes Relative: 19 %
Lymphs Abs: 1.5 K/uL (ref 1.0–3.6)
MCH: 32 pg (ref 26.0–34.0)
MCHC: 34.4 g/dL (ref 32.0–36.0)
MCV: 93.2 fL (ref 80.0–100.0)
Monocytes Absolute: 0.6 K/uL (ref 0.2–1.0)
Monocytes Relative: 8 %
Neutro Abs: 5.7 K/uL (ref 1.4–6.5)
Neutrophils Relative %: 72 %
Platelets: 256 K/uL (ref 150–440)
RBC: 4.32 MIL/uL — ABNORMAL LOW (ref 4.40–5.90)
RDW: 14.1 % (ref 11.5–14.5)
WBC: 7.9 K/uL (ref 3.8–10.6)

## 2018-02-26 NOTE — Progress Notes (Signed)
No new changes noted today 

## 2018-02-26 NOTE — Progress Notes (Signed)
Harold Chavez is a 84 y.o. male with CLL who is seen for 6 month assessment.  HPI: The patient was last seen in the medical oncology clinic on 08/28/2017.  At that time, he was doing well.  He denied any B symptoms.  Exam revealed no adenopathy or hepatosplenomegaly.  Counts were stable.  He had mild renal insufficiency (BUN 34; Cr 1.47).  During the interim, patient is doing well. 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 He is scheduled for removal in a week or so.  He is trying to be more active as of late. He works in the yard, does house work, and uses the "stretchy bands" he received from the hospital. Patient denies B symptoms. No interval infections. Patient denies and bruising, bleeding, or areas of palpable adenopathy.   Patient eating well. Weight has increased 8 pounds. Patient denies pain in clinic today.    Past Medical History:  Diagnosis Date  . B12 deficiency anemia   . BCC (basal cell carcinoma of skin) 2017   L frontal scalp  . Benign paroxysmal positional vertigo   . C. difficile diarrhea 07/14/2014   2015   . Chronic kidney disease   . History of pneumonia 07/14/2014   2015   . History of shingles 2010  . IgG gammopathy   . 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 Leukemia ([place])    CLL  . 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 Sarcoidosis   . Sensorineural hearing loss (SNHL) of both ears 10/2015   haering aide eval rec through [facility])  . Squamous cell carcinoma in situ of skin 2017   R forearm, nose, R cheek - [place]    Past Surgical History:  Procedure Laterality Date  . MOHS SURGERY  2015   R cheek lentigo maligna in situ  . TESTICLE REMOVAL  1937   right; undescended    Family History  Problem Relation Age of Onset  . Stroke Father     Social History:  reports that he has never smoked.  He has never used smokeless tobacco. He reports that he does not drink alcohol or use drugs.  The patient is accompanied by his son, Harold Chavez,  today.  Allergies: No Known Allergies  Current Medications: Current Outpatient Medications  Medication Sig Dispense Refill  . aspirin 81 MG tablet Take 81 mg by mouth every Monday, Wednesday, and Friday.     Marland Kitchen atenolol (TENORMIN) 25 MG tablet TAKE 1 TABLET (25 MG TOTAL) BY MOUTH DAILY. 30 tablet 5  . cyanocobalamin (V-R VITAMIN B-12) 500 MCG tablet Take 1 tablet (500 mcg total) by mouth daily. (Patient taking differently: Take 500 mcg by mouth every other day. )    . meclizine (ANTIVERT) 25 MG tablet Take 1 tablet (25 mg total) by mouth 3 (three) times daily as needed for dizziness. (Patient not taking: Reported on 02/26/2018) 20 tablet 0   No current facility-administered medications for this visit.     Review of Systems  Constitutional: Negative for diaphoresis, fever, malaise/fatigue and weight loss (weight up 8 pounds).       "I'm feeling pretty good"  HENT: Negative.  Negative for nosebleeds, sinus pain and sore throat.   Eyes: Negative.  Negative for blurred vision, double vision, pain and redness.  Respiratory: Negative for cough,  hemoptysis, sputum production and shortness of breath.   Cardiovascular: Positive for leg swelling (ankles). Negative for chest pain, palpitations, orthopnea and PND.  Gastrointestinal: Negative for abdominal pain, blood in stool, constipation, diarrhea, melena, nausea and vomiting.  Genitourinary: Negative for dysuria, frequency, hematuria and urgency.       Nocturia  Musculoskeletal: Negative for back pain, falls, joint pain and myalgias.  Skin: Negative for itching and rash.       Melanotic lesion to lower back; scheduled for excision  Neurological: Negative for dizziness, tremors, weakness and headaches.  Endo/Heme/Allergies: Does not bruise/bleed easily.  Psychiatric/Behavioral: Negative for depression, memory  loss and suicidal ideas. The patient is not nervous/anxious and does not have insomnia.   All other systems reviewed and are negative.  Performance status (ECOG): 1 - Symptomatic but completely ambulatory   Physical Exam: Blood pressure 129/77, pulse 67, temperature (!) 97.5 F (36.4 C), temperature source Tympanic, resp. rate 18, height '5\' 9"'  (1.753 m), weight 165 lb (74.8 kg), SpO2 96 %. GENERAL:  Thin gentleman sitting comfortably in the exam room in no acute distress. MENTAL STATUS:  Alert and oriented to person, place and time. HEAD:  Short gray hair.  Normocephalic, atraumatic, face symmetric, no Cushingoid features. EYES:  Glasses.  Blue eyes.  Pupils equal round and reactive to light and accomodation.  No conjunctivitis or scleral icterus. ENT:  Oropharynx clear without lesion.  Tongue normal. Mucous membranes moist.  RESPIRATORY:  Clear to auscultation without rales, wheezes or rhonchi. CARDIOVASCULAR:  Regular rate and rhythm without murmur, rub or gallop. ABDOMEN:  Soft, non-tender, with active bowel sounds, and no hepatosplenomegaly.  No masses. SKIN:  Left lower back s/p biopsy (site of melanoma removal).  No rashes, ulcers or lesions. EXTREMITIES:Bilateral ankle edema (left > right).  No skin discoloration or tenderness.  No palpable cords. LYMPH NODES: No palpable cervical, supraclavicular, axillary or inguinal adenopathy  NEUROLOGICAL: Unremarkable. PSYCH:  Appropriate.    Orders Only on 02/26/2018  Component Date Value Ref Range Status  . Sodium 02/26/2018 139  135 - 145 mmol/L Final  . Potassium 02/26/2018 5.0  3.5 - 5.1 mmol/L Final  . Chloride 02/26/2018 103  101 - 111 mmol/L Final  . CO2 02/26/2018 27  22 - 32 mmol/L Final  . Glucose, Bld 02/26/2018 93  65 - 99 mg/dL Final  . BUN 02/26/2018 28* 6 - 20 mg/dL Final  . Creatinine, Ser 02/26/2018 1.50* 0.61 - 1.24 mg/dL Final  . Calcium 02/26/2018 9.4  8.9 - 10.3 mg/dL Final  . GFR calc non Af Amer 02/26/2018 39* >60  mL/min Final  . GFR calc Af Amer 02/26/2018 45* >60 mL/min Final   Comment: (NOTE) The eGFR has been calculated using the CKD EPI equation. This calculation has not been validated in all clinical situations. eGFR's persistently <60 mL/min signify possible Chronic Kidney Disease.   Harold Chavez gap 02/26/2018 9  5 - 15 Final   Performed at [facility], [address] [place], [place] [address]  . WBC 02/26/2018 7.9  3.8 - 10.6 K/uL Final  . RBC 02/26/2018 4.32* 4.40 - 5.90 MIL/uL Final  . Hemoglobin 02/26/2018 13.8  13.0 - 18.0 g/dL Final  . HCT 02/26/2018 40.2  40.0 - 52.0 % Final  . MCV 02/26/2018 93.2  80.0 - 100.0 fL Final  . MCH 02/26/2018 32.0  26.0 - 34.0 pg Final  . MCHC 02/26/2018 34.4  32.0 - 36.0 g/dL Final  . RDW 02/26/2018 14.1  11.5 -  14.5 % Final  . Platelets 02/26/2018 256  150 - 440 K/uL Final  . Neutrophils Relative % 02/26/2018 72  % Final  . Neutro Abs 02/26/2018 5.7  1.4 - 6.5 K/uL Final  . Lymphocytes Relative 02/26/2018 19  % Final  . Lymphs Abs 02/26/2018 1.5  1.0 - 3.6 K/uL Final  . Monocytes Relative 02/26/2018 8  % Final  . Monocytes Absolute 02/26/2018 0.6  0.2 - 1.0 K/uL Final  . Eosinophils Relative 02/26/2018 1  % Final  . Eosinophils Absolute 02/26/2018 0.1  0 - 0.7 K/uL Final  . Basophils Relative 02/26/2018 0  % Final  . Basophils Absolute 02/26/2018 0.0  0 - 0.1 K/uL Final   Performed at [facility], [address] [place], [place] [address]    Assessment:  Harold Chavez is a 84 y.o. male with chronic lymphocytic leukemia (CLL).  He presented in 10/2013 with dehydration and syncope.  Evaluation revealed axillary adenopathy.    He received 5 of 6 cycles of planned bendamustine and Rituxan (12/17/2013 - 04/27/2014).  With cycle #5 he developed neutropenia and received GCSF.  He was subsequently admitted with sepsis and pneumonia.  Course was complicated by C diff.  Decision was made to defer his last cycle of chemotherapy.  He has a  monoclonal gammopathy of unclear significance.  IgG was 1101 on 08/19/2014.  Review of prior labs noted no M-spike, but elevated kappa light chains on 12/01/2013.  Kappa free light chains were 677.68 (high), lambda free light chains 453.8 (high), and ratio 1.49 (normal) on 12/01/2013.  Kappa free light chains were 34.64 (high), lambda free light chains 22.11 (high), with a ratio 1.57 (normal) on 11/17/2014.    Symptomatically, he is doing well.  He denies any B symptoms.  Exam reveals no adenopathy or hepatosplenomegaly.  Counts are stable.  He has mild renal insufficiency (BUN 28; Cr 1.50).  Plan: 1.  Labs today: CBC with diff, CMP, SPEP, free light chains.  2.  Follow-up regarding planned melanoma re-excision 02/2018. 3.  RTC in 6 months for MD assessment and labs (CBC, BMP).   Honor Loh, NP  02/26/2018, 11:40 AM   I saw and evaluated the patient, participating in the key portions of the service and reviewing pertinent diagnostic studies and records.  I reviewed the nurse practitioner's note and agree with the findings and the plan.  The assessment and plan were discussed with the patient.  A few questions were asked by the patient and answered.   Nolon Stalls, MD 02/26/2018,11:40 AM

## 2018-02-27 LAB — KAPPA/LAMBDA LIGHT CHAINS
Kappa free light chain: 33.6 mg/L — ABNORMAL HIGH (ref 3.3–19.4)
Kappa, lambda light chain ratio: 2.04 — ABNORMAL HIGH (ref 0.26–1.65)
Lambda free light chains: 16.5 mg/L (ref 5.7–26.3)

## 2018-03-01 LAB — MULTIPLE MYELOMA PANEL, SERUM
Albumin SerPl Elph-Mcnc: 4 g/dL (ref 2.9–4.4)
Albumin/Glob SerPl: 1.5 (ref 0.7–1.7)
Alpha 1: 0.2 g/dL (ref 0.0–0.4)
Alpha2 Glob SerPl Elph-Mcnc: 0.8 g/dL (ref 0.4–1.0)
B-Globulin SerPl Elph-Mcnc: 0.7 g/dL (ref 0.7–1.3)
Gamma Glob SerPl Elph-Mcnc: 1.1 g/dL (ref 0.4–1.8)
Globulin, Total: 2.8 g/dL (ref 2.2–3.9)
IgA: 72 mg/dL (ref 61–437)
IgG (Immunoglobin G), Serum: 1288 mg/dL (ref 700–1600)
IgM (Immunoglobulin M), Srm: 34 mg/dL (ref 15–143)
Total Protein ELP: 6.8 g/dL (ref 6.0–8.5)

## 2018-03-09 DIAGNOSIS — L905 Scar conditions and fibrosis of skin: Secondary | ICD-10-CM | POA: Diagnosis not present

## 2018-03-09 DIAGNOSIS — D0359 Melanoma in situ of other part of trunk: Secondary | ICD-10-CM | POA: Diagnosis not present

## 2018-03-12 DIAGNOSIS — L905 Scar conditions and fibrosis of skin: Secondary | ICD-10-CM | POA: Diagnosis not present

## 2018-06-16 ENCOUNTER — Other Ambulatory Visit: Payer: Self-pay | Admitting: Family Medicine

## 2018-07-23 DIAGNOSIS — D229 Melanocytic nevi, unspecified: Secondary | ICD-10-CM | POA: Diagnosis not present

## 2018-07-23 DIAGNOSIS — L819 Disorder of pigmentation, unspecified: Secondary | ICD-10-CM | POA: Diagnosis not present

## 2018-07-23 DIAGNOSIS — Z85820 Personal history of malignant melanoma of skin: Secondary | ICD-10-CM | POA: Diagnosis not present

## 2018-07-23 DIAGNOSIS — Z85828 Personal history of other malignant neoplasm of skin: Secondary | ICD-10-CM | POA: Diagnosis not present

## 2018-07-23 DIAGNOSIS — L821 Other seborrheic keratosis: Secondary | ICD-10-CM | POA: Diagnosis not present

## 2018-07-23 DIAGNOSIS — L814 Other melanin hyperpigmentation: Secondary | ICD-10-CM | POA: Diagnosis not present

## 2018-07-23 DIAGNOSIS — D485 Neoplasm of uncertain behavior of skin: Secondary | ICD-10-CM | POA: Diagnosis not present

## 2018-07-23 DIAGNOSIS — L570 Actinic keratosis: Secondary | ICD-10-CM | POA: Diagnosis not present

## 2018-07-26 DIAGNOSIS — Z23 Encounter for immunization: Secondary | ICD-10-CM | POA: Diagnosis not present

## 2018-07-31 HISTORY — PX: SKIN CANCER EXCISION: SHX779

## 2018-08-25 ENCOUNTER — Other Ambulatory Visit: Payer: Self-pay

## 2018-08-25 ENCOUNTER — Inpatient Hospital Stay (HOSPITAL_BASED_OUTPATIENT_CLINIC_OR_DEPARTMENT_OTHER): Payer: Medicare Other | Admitting: Hematology and Oncology

## 2018-08-25 ENCOUNTER — Inpatient Hospital Stay: Payer: Medicare Other | Attending: Hematology and Oncology

## 2018-08-25 VITALS — BP 119/74 | HR 71 | Temp 97.1°F | Resp 18 | Wt 152.9 lb

## 2018-08-25 DIAGNOSIS — D472 Monoclonal gammopathy: Secondary | ICD-10-CM | POA: Diagnosis not present

## 2018-08-25 DIAGNOSIS — C9110 Chronic lymphocytic leukemia of B-cell type not having achieved remission: Secondary | ICD-10-CM

## 2018-08-25 DIAGNOSIS — N189 Chronic kidney disease, unspecified: Secondary | ICD-10-CM | POA: Insufficient documentation

## 2018-08-25 LAB — CBC WITH DIFFERENTIAL/PLATELET
Abs Immature Granulocytes: 0.02 10*3/uL (ref 0.00–0.07)
BASOS PCT: 0 %
Basophils Absolute: 0 10*3/uL (ref 0.0–0.1)
Eosinophils Absolute: 0.2 10*3/uL (ref 0.0–0.5)
Eosinophils Relative: 2 %
HCT: 41.3 % (ref 39.0–52.0)
Hemoglobin: 13.4 g/dL (ref 13.0–17.0)
Immature Granulocytes: 0 %
LYMPHS ABS: 1.7 10*3/uL (ref 0.7–4.0)
Lymphocytes Relative: 22 %
MCH: 30.9 pg (ref 26.0–34.0)
MCHC: 32.4 g/dL (ref 30.0–36.0)
MCV: 95.4 fL (ref 80.0–100.0)
MONOS PCT: 6 %
Monocytes Absolute: 0.5 10*3/uL (ref 0.1–1.0)
NEUTROS PCT: 70 %
Neutro Abs: 5.3 10*3/uL (ref 1.7–7.7)
PLATELETS: 244 10*3/uL (ref 150–400)
RBC: 4.33 MIL/uL (ref 4.22–5.81)
RDW: 13.5 % (ref 11.5–15.5)
WBC: 7.7 10*3/uL (ref 4.0–10.5)
nRBC: 0 % (ref 0.0–0.2)

## 2018-08-25 LAB — COMPREHENSIVE METABOLIC PANEL
ALT: 11 U/L (ref 0–44)
AST: 18 U/L (ref 15–41)
Albumin: 4.2 g/dL (ref 3.5–5.0)
Alkaline Phosphatase: 62 U/L (ref 38–126)
Anion gap: 4 — ABNORMAL LOW (ref 5–15)
BUN: 31 mg/dL — ABNORMAL HIGH (ref 8–23)
CO2: 29 mmol/L (ref 22–32)
Calcium: 9.4 mg/dL (ref 8.9–10.3)
Chloride: 104 mmol/L (ref 98–111)
Creatinine, Ser: 1.27 mg/dL — ABNORMAL HIGH (ref 0.61–1.24)
GFR calc Af Amer: 56 mL/min — ABNORMAL LOW (ref >60)
GFR calc non Af Amer: 49 mL/min — ABNORMAL LOW (ref >60)
Glucose, Bld: 86 mg/dL (ref 70–99)
Potassium: 5 mmol/L (ref 3.5–5.1)
Sodium: 137 mmol/L (ref 135–145)
Total Bilirubin: 0.8 mg/dL (ref 0.3–1.2)
Total Protein: 7.1 g/dL (ref 6.5–8.1)

## 2018-08-25 NOTE — Progress Notes (Signed)
Here for follow up .overall pt stated he feels" pretty good "

## 2018-08-25 NOTE — Progress Notes (Signed)
[facility] day:  08/25/18  Chief Complaint: Harold Chavez is a 84 y.o. male with CLL who is seen for 6 month assessment.  HPI: The patient was last seen in the medical oncology clinic on 02/26/2018.  At that time, he was doing well.  He denied any B symptoms.  Exam revealed no adenopathy or hepatosplenomegaly.  Counts were stable.  He had mild renal insufficiency (BUN 28; Cr 1.50).  During the interim, he has felt good.  Weight is down 3 pounds.  He states that he loses weight in the summer and gains weight in the winter.  Appetite is good.  He denies any fever, sweats, bruising, bleeding, adenopathy or infections.   Past Medical History:  Diagnosis Date  . B12 deficiency anemia   . BCC (basal cell carcinoma of skin) 2017   L frontal scalp  . Benign paroxysmal positional vertigo   . C. difficile diarrhea 07/14/2014   2015   . Chronic kidney disease   . History of pneumonia 07/14/2014   2015   . History of shingles 2010  . IgG gammopathy   . 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 Leukemia ([place])    CLL  . 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 Sarcoidosis   . Sensorineural hearing loss (SNHL) of both ears 10/2015   haering aide eval rec through [facility])  . Squamous cell carcinoma in situ of skin 2017   R forearm, nose, R cheek - [place]    Past Surgical History:  Procedure Laterality Date  . MOHS SURGERY  2015   R cheek lentigo maligna in situ  . TESTICLE REMOVAL  1937   right; undescended    Family History  Problem Relation Age of Onset  . Stroke Father     Social History:  reports that he has never smoked. He has never used smokeless tobacco. He reports that he does not drink alcohol or use drugs.  The patient is accompanied by his son, [name],  today.  Allergies: No Known Allergies  Current Medications: Current Outpatient Medications  Medication Sig Dispense  Refill  . aspirin 81 MG tablet Take 81 mg by mouth every Monday, Wednesday, and Friday.     Marland Kitchen atenolol (TENORMIN) 25 MG tablet TAKE 1 TABLET (25 MG TOTAL) BY MOUTH DAILY. 90 tablet 1  . cyanocobalamin (V-R VITAMIN B-12) 500 MCG tablet Take 1 tablet (500 mcg total) by mouth daily. (Patient taking differently: Take 500 mcg by mouth every other day. )    . meclizine (ANTIVERT) 25 MG tablet Take 1 tablet (25 mg total) by mouth 3 (three) times daily as needed for dizziness. (Patient not taking: Reported on 02/26/2018) 20 tablet 0   No current facility-administered medications for this visit.     Review of Systems  Constitutional: Negative for chills, diaphoresis, fever and malaise/fatigue. Weight loss: 3 pounds.       Feels "good".  HENT: Negative.  Negative for congestion, ear pain, nosebleeds, sinus pain and sore throat.   Eyes: Negative.  Negative for blurred vision, double vision, pain and redness.  Respiratory: Negative.  Negative for cough, hemoptysis, sputum production and shortness of breath.   Cardiovascular: Negative.  Negative for chest pain, palpitations, orthopnea, leg swelling and PND.  Gastrointestinal: Negative.  Negative for abdominal pain, blood in stool, constipation, diarrhea, melena, nausea and vomiting.  Genitourinary: Negative.  Negative  for dysuria, frequency, hematuria and urgency.       Nocturia  Musculoskeletal: Negative.  Negative for back pain, falls, joint pain, myalgias and neck pain.  Skin: Negative.  Negative for itching and rash.  Neurological: Negative.  Negative for dizziness, tremors, focal weakness, weakness and headaches.  Endo/Heme/Allergies: Negative.  Does not bruise/bleed easily.  Psychiatric/Behavioral: Negative for depression and memory loss. The patient is not nervous/anxious and does not have insomnia.   All other systems reviewed and are negative.  Performance status (ECOG): 1  Physical Exam: Blood pressure 119/74, pulse 71, temperature (!) 97.1  F (36.2 C), temperature source Tympanic, resp. rate 18, weight 152 lb 14.4 oz (69.4 kg). GENERAL:  Thin elderly gentleman sitting comfortably in the exam room in no acute distress. MENTAL STATUS:  Alert and oriented to person, place and time. HEAD:  Short gray hair.  Normocephalic, atraumatic, face symmetric, no Cushingoid features. EYES:  Glasses.  Blue eyes.  Pupils equal round and reactive to light and accomodation.  No conjunctivitis or scleral icterus. ENT:  Oropharynx clear without lesion.  Tongue normal. Mucous membranes moist.  RESPIRATORY:  Clear to auscultation without rales, wheezes or rhonchi. CARDIOVASCULAR:  Regular rate and rhythm without murmur, rub or gallop. ABDOMEN:  Soft, non-tender, with active bowel sounds, and no hepatosplenomegaly.  No masses. SKIN:  Left lower back healed biopsy site s/p melanoma removal.  No rashes, ulcers or lesions. EXTREMITIES: No edema, no skin discoloration or tenderness.  No palpable cords. LYMPH NODES: No palpable cervical, supraclavicular, axillary or inguinal adenopathy  NEUROLOGICAL: Unremarkable. PSYCH:  Appropriate.    Appointment on 08/25/2018  Component Date Value Ref Range Status  . WBC 08/25/2018 7.7  4.0 - 10.5 K/uL Final  . RBC 08/25/2018 4.33  4.22 - 5.81 MIL/uL Final  . Hemoglobin 08/25/2018 13.4  13.0 - 17.0 g/dL Final  . HCT 08/25/2018 41.3  39.0 - 52.0 % Final  . MCV 08/25/2018 95.4  80.0 - 100.0 fL Final  . MCH 08/25/2018 30.9  26.0 - 34.0 pg Final  . MCHC 08/25/2018 32.4  30.0 - 36.0 g/dL Final  . RDW 08/25/2018 13.5  11.5 - 15.5 % Final  . Platelets 08/25/2018 244  150 - 400 K/uL Final  . nRBC 08/25/2018 0.0  0.0 - 0.2 % Final  . Neutrophils Relative % 08/25/2018 70  % Final  . Neutro Abs 08/25/2018 5.3  1.7 - 7.7 K/uL Final  . Lymphocytes Relative 08/25/2018 22  % Final  . Lymphs Abs 08/25/2018 1.7  0.7 - 4.0 K/uL Final  . Monocytes Relative 08/25/2018 6  % Final  . Monocytes Absolute 08/25/2018 0.5  0.1 - 1.0  K/uL Final  . Eosinophils Relative 08/25/2018 2  % Final  . Eosinophils Absolute 08/25/2018 0.2  0.0 - 0.5 K/uL Final  . Basophils Relative 08/25/2018 0  % Final  . Basophils Absolute 08/25/2018 0.0  0.0 - 0.1 K/uL Final  . Immature Granulocytes 08/25/2018 0  % Final  . Abs Immature Granulocytes 08/25/2018 0.02  0.00 - 0.07 K/uL Final   Performed at [facility], [address] [place], [place] [address]  . Sodium 08/25/2018 137  135 - 145 mmol/L Final  . Potassium 08/25/2018 5.0  3.5 - 5.1 mmol/L Final  . Chloride 08/25/2018 104  98 - 111 mmol/L Final  . CO2 08/25/2018 29  22 - 32 mmol/L Final  . Glucose, Bld 08/25/2018 86  70 - 99 mg/dL Final  . BUN 08/25/2018 31* 8 -  23 mg/dL Final  . Creatinine, Ser 08/25/2018 1.27* 0.61 - 1.24 mg/dL Final  . Calcium 08/25/2018 9.4  8.9 - 10.3 mg/dL Final  . Total Protein 08/25/2018 7.1  6.5 - 8.1 g/dL Final  . Albumin 08/25/2018 4.2  3.5 - 5.0 g/dL Final  . AST 08/25/2018 18  15 - 41 U/L Final  . ALT 08/25/2018 11  0 - 44 U/L Final  . Alkaline Phosphatase 08/25/2018 62  38 - 126 U/L Final  . Total Bilirubin 08/25/2018 0.8  0.3 - 1.2 mg/dL Final  . GFR calc non Af Amer 08/25/2018 49* >60 mL/min Final  . GFR calc Af Amer 08/25/2018 56* >60 mL/min Final  . Anion gap 08/25/2018 4* 5 - 15 Final   Performed at [facility], [address] [place], [place] [address]    Assessment:  KADIEN LINEMAN is a 84 y.o. male with chronic lymphocytic leukemia (CLL).  He presented in 10/2013 with dehydration and syncope.  Evaluation revealed axillary adenopathy.    He received 5 of 6 cycles of planned bendamustine and Rituxan (12/17/2013 - 04/27/2014).  With cycle #5 he developed neutropenia and received GCSF.  He was subsequently admitted with sepsis and pneumonia.  Course was complicated by C diff.  Decision was made to defer his last cycle of chemotherapy.  He has a monoclonal gammopathy of unclear significance.  IgG was 1101 on 08/19/2014.   Review of prior labs noted no M-spike, but elevated kappa light chains on 12/01/2013.  Kappa free light chains were 677.68 (high), lambda free light chains 453.8 (high), and ratio 1.49 (normal) on 12/01/2013.  Kappa free light chains were 34.64 (high), lambda free light chains 22.11 (high), with a ratio 1.57 (normal) on 11/17/2014.  SPEP on 02/26/2018 revealed no monoclonal protein.  Kappa free light chains were 33.6 (ratio 2.04) on 02/26/2018.  Symptomatically, he feels good.  He denies any B symptoms.  Exam reveals no adenopathy or hepatosplenomegaly.  Counts are stable.  He has mild renal insufficiency (BUN 31; Cr 1.27).  Plan: 1.  Labs today:  CBC with diff, BMP. 2.  Chronic lymphocytic leukemia:  Clinically doing well.  Counts stable.  Continue surveillance. 3.  Discuss transition to [place].  Patient would like to stay in [place]. 4.  RTC in 6 months for MD (Dr Janese Banks) assessment and labs (CBC, BMP).   Lequita Asal, MD  08/25/2018, 4:35 PM

## 2018-10-04 ENCOUNTER — Encounter: Payer: Self-pay | Admitting: Hematology and Oncology

## 2018-11-06 ENCOUNTER — Ambulatory Visit: Payer: 59

## 2018-11-10 ENCOUNTER — Telehealth: Payer: Self-pay

## 2018-11-10 DIAGNOSIS — D531 Other megaloblastic anemias, not elsewhere classified: Secondary | ICD-10-CM

## 2018-11-10 DIAGNOSIS — N183 Chronic kidney disease, stage 3 unspecified: Secondary | ICD-10-CM

## 2018-11-10 NOTE — Telephone Encounter (Signed)
CPE labs ordered 

## 2018-11-11 ENCOUNTER — Ambulatory Visit (INDEPENDENT_AMBULATORY_CARE_PROVIDER_SITE_OTHER): Payer: Medicare Other

## 2018-11-11 VITALS — BP 120/86 | HR 86 | Temp 97.8°F | Ht 69.5 in | Wt 152.6 lb

## 2018-11-11 DIAGNOSIS — N183 Chronic kidney disease, stage 3 unspecified: Secondary | ICD-10-CM

## 2018-11-11 DIAGNOSIS — Z0000 Encounter for general adult medical examination without abnormal findings: Secondary | ICD-10-CM

## 2018-11-11 DIAGNOSIS — D531 Other megaloblastic anemias, not elsewhere classified: Secondary | ICD-10-CM | POA: Diagnosis not present

## 2018-11-11 LAB — RENAL FUNCTION PANEL
Albumin: 4.2 g/dL (ref 3.5–5.2)
BUN: 34 mg/dL — ABNORMAL HIGH (ref 6–23)
CO2: 29 mEq/L (ref 19–32)
Calcium: 9.4 mg/dL (ref 8.4–10.5)
Chloride: 105 mEq/L (ref 96–112)
Creatinine, Ser: 1.55 mg/dL — ABNORMAL HIGH (ref 0.40–1.50)
GFR: 42.1 mL/min — ABNORMAL LOW (ref >60.00)
GLUCOSE: 93 mg/dL (ref 70–99)
Phosphorus: 3.5 mg/dL (ref 2.3–4.6)
Potassium: 4.6 mEq/L (ref 3.5–5.1)
SODIUM: 141 meq/L (ref 135–145)

## 2018-11-11 LAB — MICROALBUMIN / CREATININE URINE RATIO
Creatinine,U: 117 mg/dL
MICROALB/CREAT RATIO: 9.4 mg/g (ref 0.0–30.0)
Microalb, Ur: 11 mg/dL — ABNORMAL HIGH (ref 0.0–1.9)

## 2018-11-11 LAB — VITAMIN B12: VITAMIN B 12: 551 pg/mL (ref 211–911)

## 2018-11-11 NOTE — Patient Instructions (Signed)
Harold Chavez , Thank you for taking time to come for your Medicare Wellness Visit. I appreciate your ongoing commitment to your health goals. Please review the following plan we discussed and let me know if I can assist you in the future.   These are the goals we discussed: Goals    . Increase physical activity     Starting 11/11/2018, I will continue to exercise for 40-60 minutes daily        This is a list of the screening recommended for you and due dates:  Health Maintenance  Topic Date Due  . DTaP/Tdap/Td vaccine (1 - Tdap) 09/29/2020*  . Tetanus Vaccine  09/29/2020*  . Urine Protein Check  11/12/2019  . Flu Shot  Completed  . Pneumonia vaccines  Completed  *Topic was postponed. The date shown is not the original due date.   Preventive Care for Adults  A healthy lifestyle and preventive care can promote health and wellness. Preventive health guidelines for adults include the following key practices.  . A routine yearly physical is a good way to check with your health care provider about your health and preventive screening. It is a chance to share any concerns and updates on your health and to receive a thorough exam.  . Visit your dentist for a routine exam and preventive care every 6 months. Brush your teeth twice a day and floss once a day. Good oral hygiene prevents tooth decay and gum disease.  . The frequency of eye exams is based on your age, health, family medical history, use  of contact lenses, and other factors. Follow your health care provider's recommendations for frequency of eye exams.  . Eat a healthy diet. Foods like vegetables, fruits, whole grains, low-fat dairy products, and lean protein foods contain the nutrients you need without too many calories. Decrease your intake of foods high in solid fats, added sugars, and salt. Eat the right amount of calories for you. Get information about a proper diet from your health care provider, if necessary.  . Regular  physical exercise is one of the most important things you can do for your health. Most adults should get at least 150 minutes of moderate-intensity exercise (any activity that increases your heart rate and causes you to sweat) each week. In addition, most adults need muscle-strengthening exercises on 2 or more days a week.  Silver Sneakers may be a benefit available to you. To determine eligibility, you may visit the website: www.silversneakers.com or contact program at (971)878-6527 Mon-Fri between 8AM-8PM.   . Maintain a healthy weight. The body mass index (BMI) is a screening tool to identify possible weight problems. It provides an estimate of body fat based on height and weight. Your health care provider can find your BMI and can help you achieve or maintain a healthy weight.   For adults 20 years and older: ? A BMI below 18.5 is considered underweight. ? A BMI of 18.5 to 24.9 is normal. ? A BMI of 25 to 29.9 is considered overweight. ? A BMI of 30 and above is considered obese.   . Maintain normal blood lipids and cholesterol levels by exercising and minimizing your intake of saturated fat. Eat a balanced diet with plenty of fruit and vegetables. Blood tests for lipids and cholesterol should begin at age 10 and be repeated every 5 years. If your lipid or cholesterol levels are high, you are over 50, or you are at high risk for heart disease, you may need  your cholesterol levels checked more frequently. Ongoing high lipid and cholesterol levels should be treated with medicines if diet and exercise are not working.  . If you smoke, find out from your health care provider how to quit. If you do not use tobacco, please do not start.  . If you choose to drink alcohol, please do not consume more than 2 drinks per day. One drink is considered to be 12 ounces (355 mL) of beer, 5 ounces (148 mL) of wine, or 1.5 ounces (44 mL) of liquor.  . If you are 57-56 years old, ask your health care provider if  you should take aspirin to prevent strokes.  . Use sunscreen. Apply sunscreen liberally and repeatedly throughout the day. You should seek shade when your shadow is shorter than you. Protect yourself by wearing long sleeves, pants, a wide-brimmed hat, and sunglasses year round, whenever you are outdoors.  . Once a month, do a whole body skin exam, using a mirror to look at the skin on your back. Tell your health care provider of new moles, moles that have irregular borders, moles that are larger than a pencil eraser, or moles that have changed in shape or color.

## 2018-11-11 NOTE — Progress Notes (Signed)
PCP notes:   Health maintenance:  Microalbumin - completed Tetanus vaccine - postponed/insurance   Abnormal screenings:   Hearing - failed  Hearing Screening   125Hz  250Hz  500Hz  1000Hz  2000Hz  3000Hz  4000Hz  6000Hz  8000Hz   Right ear:   0 0 0  40    Left ear:   0 0 0  40     Patient concerns:   None  Nurse concerns:  None  Next PCP appt:   11/12/18 @ 0930

## 2018-11-11 NOTE — Progress Notes (Signed)
Subjective:   Harold Chavez is a 85 y.o. male who presents for Medicare Annual/Subsequent preventive examination.  Review of Systems:  N/A Cardiac Risk Factors include: advanced age (>56men, >61 women);male gender     Objective:    Vitals: BP 120/86 (BP Location: Right Arm, Patient Position: Sitting, Cuff Size: Normal)   Pulse 86   Temp 97.8 F (36.6 C) (Oral)   Ht 5' 9.5" (1.765 m)   Wt 152 lb 9.6 oz (69.2 kg)   SpO2 91%   BMI 22.21 kg/m   Body mass index is 22.21 kg/m.  Advanced Directives 11/11/2018 08/25/2018 02/26/2018 11/03/2017 08/28/2017 06/10/2017 02/25/2017  Does Patient Have a Medical Advance Directive? Yes Yes No Yes Yes No Yes  Type of Paramedic of [place];Living will [place];Living will - [place];Living will [place];Living will - -  Does patient want to make changes to medical advance directive? - - - - No - Patient declined - -  Copy of [place] in Chart? No - copy requested No - copy requested - No - copy requested No - copy requested - -  Would patient like information on creating a medical advance directive? - - No - Patient declined - - No - Patient declined -    Tobacco Social History   Tobacco Use  Smoking Status Never Smoker  Smokeless Tobacco Never Used     Counseling given: No   Clinical Intake:  Pre-visit preparation completed: Yes  Pain : No/denies pain Pain Score: 0-No pain     Nutritional Status: BMI of 19-24  Normal Nutritional Risks: None Diabetes: No CBG done?: No Did pt. bring in CBG monitor from home?: No  How often do you need to have someone help you when you read instructions, pamphlets, or other written materials from your doctor or pharmacy?: 1 - Never  Interpreter Needed?: No  Information entered by :: LPinson, LPN  Past Medical History:  Diagnosis Date  . B12 deficiency anemia   . BCC (basal cell carcinoma of  skin) 2017   L frontal scalp  . Benign paroxysmal positional vertigo   . C. difficile diarrhea 07/14/2014   2015   . Chronic kidney disease   . History of pneumonia 07/14/2014   2015   . History of shingles 2010  . IgG gammopathy   . Lentigo maligna of right cheek ([place]) 2015   s/p Mohs ([place]/Lesh[place])  . Leukemia ([place])    CLL  . 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 Sarcoidosis   . Sensorineural hearing loss (SNHL) of both ears 10/2015   haering aide eval rec through [facility])  . Squamous cell carcinoma in situ of skin 2017   R forearm, nose, R cheek - [place]   Past Surgical History:  Procedure Laterality Date  . MOHS SURGERY  2015   R cheek lentigo maligna in situ  . SKIN CANCER EXCISION  07/2018  . TESTICLE REMOVAL  1937   right; undescended   Family History  Problem Relation Age of Onset  . Stroke Father    Social History   Socioeconomic History  . Marital status: Married    Spouse name: Not on file  . Number of children: Not on file  . Years of education: Not on file  . Highest education level: Not on file  Occupational History  . Not on file  Social Needs  .  Financial resource strain: Not on file  . Food insecurity:    Worry: Not on file    Inability: Not on file  . Transportation needs:    Medical: Not on file    Non-medical: Not on file  Tobacco Use  . Smoking status: Never Smoker  . Smokeless tobacco: Never Used  Substance and Sexual Activity  . Alcohol use: No  . Drug use: No  . Sexual activity: Never  Lifestyle  . Physical activity:    Days per week: Not on file    Minutes per session: Not on file  . Stress: Not on file  Relationships  . Social connections:    Talks on phone: Not on file    Gets together: Not on file    Attends religious service: Not on file    Active member of club or organization: Not on file    Attends meetings of clubs or organizations: Not on file    Relationship status: Not on file  Other  Topics Concern  . 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 aspirin 81 MG tablet Take 81 mg by mouth every Monday, Wednesday, and Friday.   Marland Kitchen atenolol (TENORMIN) 25 MG tablet TAKE 1 TABLET (25 MG TOTAL) BY MOUTH DAILY.  . cyanocobalamin (V-R VITAMIN B-12) 500 MCG tablet Take 1 tablet (500 mcg total) by mouth daily. (Patient taking differently: Take 500 mcg by mouth every other day. )  . meclizine (ANTIVERT) 25 MG tablet Take 1 tablet (25 mg total) by mouth 3 (three) times daily as needed for dizziness. (Patient not taking: Reported on 02/26/2018)   No facility-administered encounter medications on file as of 11/11/2018.     Activities of Daily Living In your present state of health, do you have any difficulty performing the following activities: 11/11/2018  Hearing? Y  Vision? N  Difficulty concentrating or making decisions? N  Walking or climbing stairs? Y  Dressing or bathing? N  Doing errands, shopping? Y  Preparing Food and eating ? N  Using the Toilet? N  In the past six months, have you accidently leaked urine? N  Do you have problems with loss of bowel control? N  Managing your Medications? N  Managing your Finances? N  Housekeeping or managing your Housekeeping? N  Some recent data might be hidden    Patient Care Team: Ria Bush, MD as PCP - General (Family Medicine) Bary Castilla, Forest Gleason, MD (General Surgery) Lequita Asal, MD as Referring Physician (Hematology and Oncology) Ralene Bathe, MD as Consulting Physician (Ophthalmology)   Assessment:   This is a routine wellness examination for [facility].   Hearing Screening   125Hz  250Hz  500Hz  1000Hz  2000Hz  3000Hz  4000Hz  6000Hz  8000Hz   Right ear:   0 0 0  40    Left ear:   0 0 0   40    Vision Screening Comments: Vision exam in March 2019   Exercise Activities and Dietary recommendations Current Exercise Habits: Home exercise routine, Type of exercise: stretching;walking, Time (Minutes): 45, Frequency (Times/Week): 7, Weekly Exercise (Minutes/Week): 315, Intensity: Mild, Exercise limited by: None identified  Goals    . Increase physical activity     Starting 11/11/2018, I will continue  to exercise for 40-60 minutes daily        Fall Risk Fall Risk  11/11/2018 11/03/2017 10/11/2016 10/16/2015  Falls in the past year? 0 No Yes No  Comment - - pt had accidental fall in home -  Number falls in past yr: - - 1 -  Injury with Fall? - - No -   Depression Screen PHQ 2/9 Scores 11/11/2018 11/03/2017 10/11/2016 10/16/2015  PHQ - 2 Score 0 0 0 0  PHQ- 9 Score 0 0 - -    Cognitive Function MMSE - Mini Mental State Exam 11/11/2018 11/03/2017 10/11/2016  Orientation to time 5 5 5   Orientation to Place 5 5 5   Registration 3 3 3   Attention/ Calculation 0 0 3  Recall 3 3 0  Language- name 2 objects 0 0 0  Language- repeat 1 1 1   Language- follow 3 step command 3 3 3   Language- read & follow direction 0 0 0  Write a sentence 0 0 0  Copy design 0 0 0  Total score 20 20 20        PLEASE NOTE: A Mini-Cog screen was completed. Maximum score is 20. A value of 0 denotes this part of [place] MMSE was not completed or the patient failed this part of the Mini-Cog screening.   Mini-Cog Screening Orientation to Time - Max 5 pts Orientation to Place - Max 5 pts Registration - Max 3 pts Recall - Max 3 pts Language Repeat - Max 1 pts Language Follow 3 Step Command - Max 3 pts   Immunization History  Administered Date(s) Administered  . Influenza, High Dose Seasonal PF 08/29/2015, 07/26/2018  . Influenza,inj,Quad PF,6+ Mos 06/23/2017  . Influenza-Unspecified 05/31/2014, 06/30/2016  . Pneumococcal Conjugate-13 10/16/2015  . Pneumococcal Polysaccharide-23 05/31/2014  . Td 03/02/2007     Screening Tests Health Maintenance  Topic Date Due  . DTaP/Tdap/Td (1 - Tdap) 09/29/2020 (Originally 06/06/1935)  . TETANUS/TDAP  09/29/2020 (Originally 03/01/2017)  . URINE MICROALBUMIN  11/12/2019  . INFLUENZA VACCINE  Completed  . PNA vac Low Risk Adult  Completed       Plan:     I have personally reviewed, addressed, and noted the following in the patient's chart:  A. Medical and social history B. Use of alcohol, tobacco or illicit drugs  C. Current medications and supplements D. Functional ability and status E.  Nutritional status F.  Physical activity G. Advance directives H. List of other physicians I.  Hospitalizations, surgeries, and ER visits in previous 12 months [place] to include hearing, vision, cognitive, depression L. Referrals and appointments - none  In addition, I have reviewed and discussed with patient certain preventive protocols, quality metrics, and best practice recommendations. A written personalized care plan for preventive services as well as general preventive health recommendations were provided to patient.  See attached scanned questionnaire for additional information.   Signed,   Lindell Noe, MHA, BS, LPN Health Coach

## 2018-11-12 ENCOUNTER — Encounter: Payer: Self-pay | Admitting: Family Medicine

## 2018-11-12 ENCOUNTER — Ambulatory Visit (INDEPENDENT_AMBULATORY_CARE_PROVIDER_SITE_OTHER): Payer: Medicare Other | Admitting: Family Medicine

## 2018-11-12 VITALS — BP 160/108 | HR 87 | Temp 97.8°F | Ht 69.5 in | Wt 154.2 lb

## 2018-11-12 DIAGNOSIS — D472 Monoclonal gammopathy: Secondary | ICD-10-CM | POA: Diagnosis not present

## 2018-11-12 DIAGNOSIS — H903 Sensorineural hearing loss, bilateral: Secondary | ICD-10-CM

## 2018-11-12 DIAGNOSIS — D531 Other megaloblastic anemias, not elsewhere classified: Secondary | ICD-10-CM

## 2018-11-12 DIAGNOSIS — C9110 Chronic lymphocytic leukemia of B-cell type not having achieved remission: Secondary | ICD-10-CM | POA: Diagnosis not present

## 2018-11-12 DIAGNOSIS — N183 Chronic kidney disease, stage 3 unspecified: Secondary | ICD-10-CM

## 2018-11-12 DIAGNOSIS — I10 Essential (primary) hypertension: Secondary | ICD-10-CM

## 2018-11-12 DIAGNOSIS — Z85820 Personal history of malignant melanoma of skin: Secondary | ICD-10-CM | POA: Diagnosis not present

## 2018-11-12 NOTE — Assessment & Plan Note (Signed)
Yearly derm check

## 2018-11-12 NOTE — Assessment & Plan Note (Signed)
Continue oral B12 520mcg MWF - blood levels stable.

## 2018-11-12 NOTE — Progress Notes (Signed)
BP (!) 160/108 (BP Location: Left Arm, Patient Position: Sitting, Cuff Size: Normal)   Pulse 87   Temp 97.8 F (36.6 C) (Oral)   Ht 5' 9.5" (1.765 m)   Wt 154 lb 4 oz (70 kg)   SpO2 94%   BMI 22.45 kg/m   BP markedly elevated on repeat  CC: AMW f/u visit Subjective:    Patient ID: Harold Chavez, male    DOB: 01-Jul-1924, 85 y.o.   MRN: [id number]  HPI: Harold Chavez is a 85 y.o. male presenting on 11/12/2018 for Annual Exam (Pt 2. Pt accompanied by his son, Harold Chavez. )   Annia Belt this week for medicare wellness visit. Note reviewed. Failed hearing screen. Not interested in audiology evaluation.   Doing well at home. No trouble with urination.  No trouble with constipation. Takes stool softener PRN.   BP elevated today - he did just finish exercise routine at home. Will recheck prior to leaving. Hasn't taken atenolol yet.   Known CLL followed by Dr Mike Gip [facility] Q6 months, planning to transition to Dr Janese Banks. S/p bendamustine and rituxan (2015). Did not receive 6th treatment due to PNA with sepsis and C diff hospitalization. Also with sarcoidosis, IgG gammopathy of undetermined significance and B12 deficiency.   Preventative: Colon cancer screening - aged out Prostate cancer screening - aged out Lung cancer screening - aged out, not candidate Flu shot yearly  Tetanus shot - 2008 Pneumovax - 05/2014, [id number] zostavax - not a candidate shingrix - discussed  Advanced directive discussion - has at home. Son Harold Chavez is HCPOA. Asked to bring me copy. Seat belt use discussed Sunscreen use discussed. No changing moles on skin. Sees derm yearly. 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 Interim medical history  since our last visit reviewed. Allergies and medications reviewed and updated. Outpatient Medications Prior to Visit  Medication Sig Dispense Refill  . atenolol (TENORMIN) 25 MG tablet TAKE 1 TABLET (25 MG TOTAL) BY MOUTH DAILY. 90 tablet 1  . cyanocobalamin (V-R VITAMIN B-12) 500 MCG tablet Take 1 tablet (500 mcg total) by mouth daily. (Patient taking differently: Take 500 mcg by mouth every other day. )    . aspirin 81 MG tablet Take 81 mg by mouth every Monday, Wednesday, and Friday.     . meclizine (ANTIVERT) 25 MG tablet Take 1 tablet (25 mg total) by mouth 3 (three) times daily as needed for dizziness. 20 tablet 0   No facility-administered medications prior to visit.      Per HPI unless specifically indicated in ROS section below Review of Systems Objective:    BP (!) 160/108 (BP Location: Left Arm, Patient Position: Sitting, Cuff Size: Normal)   Pulse 87   Temp 97.8 F (36.6 C) (Oral)   Ht 5' 9.5" (1.765 m)   Wt 154 lb 4 oz (70 kg)   SpO2 94%   BMI 22.45 kg/m   Wt Readings from Last 3 Encounters:  11/12/18 154 lb 4 oz (70 kg)  11/11/18 152 lb 9.6 oz (69.2 kg)  08/25/18 152 lb 14.4 oz (69.4 kg)    Physical Exam Vitals signs and nursing note reviewed.  Constitutional:  General: He is not in acute distress.    Appearance: Normal appearance. He is well-developed.  HENT:     Head: Normocephalic and atraumatic.     Right Ear: Hearing, tympanic membrane, ear canal and external ear normal. There is impacted cerumen.     Left Ear: Hearing, tympanic membrane, ear canal and external ear normal.     Ears:     Comments: Hard of hearing    Nose: Nose normal.     Mouth/Throat:     Mouth: Mucous membranes are moist.     Pharynx: Uvula midline. No oropharyngeal exudate or posterior oropharyngeal erythema.  Eyes:     General: No scleral icterus.    Conjunctiva/sclera: Conjunctivae normal.     Pupils: Pupils are equal, round, and reactive to light.  Neck:      Musculoskeletal: Normal range of motion and neck supple.     Vascular: No carotid bruit.  Cardiovascular:     Rate and Rhythm: Normal rate and regular rhythm.     Pulses: Normal pulses.          Radial pulses are 2+ on the right side and 2+ on the left side.     Heart sounds: Normal heart sounds. No murmur.  Pulmonary:     Effort: Pulmonary effort is normal. No respiratory distress.     Breath sounds: Normal breath sounds. No wheezing, rhonchi or rales.  Abdominal:     General: Bowel sounds are normal. There is no distension.     Palpations: Abdomen is soft. There is no mass.     Tenderness: There is no abdominal tenderness. There is no guarding or rebound.  Musculoskeletal: Normal range of motion.  Lymphadenopathy:     Cervical: No cervical adenopathy.  Skin:    General: Skin is warm and dry.     Findings: No rash.  Neurological:     Mental Status: He is alert and oriented to person, place, and time.     Comments: CN grossly intact, station and gait intact  Psychiatric:        Mood and Affect: Mood normal.        Behavior: Behavior normal.        Thought Content: Thought content normal.        Judgment: Judgment normal.       Results for orders placed or performed in visit on 11/11/18  Microalbumin/Creatinine Ratio, Urine  Result Value Ref Range   Microalb, Ur 11.0 (H) 0.0 - 1.9 mg/dL   Creatinine,U 117.0 mg/dL   Microalb Creat Ratio 9.4 0.0 - 30.0 mg/g  Vitamin B12  Result Value Ref Range   Vitamin B-12 551 211 - 911 pg/mL  Renal Function Panel  Result Value Ref Range   Sodium 141 135 - 145 mEq/L   Potassium 4.6 3.5 - 5.1 mEq/L   Chloride 105 96 - 112 mEq/L   CO2 29 19 - 32 mEq/L   Calcium 9.4 8.4 - 10.5 mg/dL   Albumin 4.2 3.5 - 5.2 g/dL   BUN 34 (H) 6 - 23 mg/dL   Creatinine, Ser 1.55 (H) 0.40 - 1.50 mg/dL   Glucose, Bld 93 70 - 99 mg/dL   Phosphorus 3.5 2.3 - 4.6 mg/dL   GFR 42.10 (L) >60.00 mL/min   Assessment & Plan:   Problem List Items Addressed This  Visit    Sensorineural hearing loss (SNHL) of both ears    Declines hearing aides.       Megaloblastic anemia  due to B12 deficiency    Continue oral B12 542mcg MWF - blood levels stable.       IgG gammopathy    Appreciate onc care.       History of malignant melanoma    Yearly derm check      Essential hypertension - Primary    With h/o tachycardia. Newly elevated blood pressures. Advised he start monitoring BP at home, with option to increase atenolol to 25mg  bid. RTC 1 mo f/u visit.       CKD (chronic kidney disease) stage 3, GFR 30-59 ml/min (HCC)    Chronic, stable. Reviewed with patient and son importance of good hydration status.       Chronic lymphocytic leukemia ([place])    Appreciate onc care          No orders of the defined types were placed in this encounter.  No orders of the defined types were placed in this encounter.   Follow up plan: Return in about 4 weeks (around 12/10/2018) for follow up visit.  Ria Bush, MD

## 2018-11-12 NOTE — Assessment & Plan Note (Signed)
Chronic, stable. Reviewed with patient and son importance of good hydration status.

## 2018-11-12 NOTE — Patient Instructions (Addendum)
If interested, check with pharmacy about new 2 shot shingles series (shingrix).  Ok to stop aspirin.  Blood pressure is staying elevated - take atenolol when you get home. Keep an eye on blood pressure at home. If staying high, may increase atenolol to 25mg  twice daily.  Bring in stool softener to review.  Return in 1 month for blood pressure follow up office visit.   Work on low salt/sodium diet - goal <1.5gm (1,500mg ) per day. Eat a diet high in fruits/vegetables and whole grains.  Look into mediterranean and DASH diet. Goal activity is 159min/wk of moderate intensity exercise.  This can be split into 30 minute chunks.  If you are not at this level, you can start with smaller 10-15 min increments and slowly build up activity. Look at [employer] for more resources

## 2018-11-12 NOTE — Assessment & Plan Note (Signed)
With h/o tachycardia. Newly elevated blood pressures. Advised he start monitoring BP at home, with option to increase atenolol to 25mg  bid. RTC 1 mo f/u visit.

## 2018-11-12 NOTE — Assessment & Plan Note (Signed)
Declines hearing aides.

## 2018-11-12 NOTE — Assessment & Plan Note (Signed)
Appreciate onc care.  

## 2018-11-13 NOTE — Progress Notes (Signed)
   Subjective:    Patient ID: Harold Chavez, male    DOB: 09/03/1924, 85 y.o.   MRN: [id number]  HPI I reviewed health advisor's note, was available for consultation, and agree with documentation and plan.    Review of Systems     Objective:   Physical Exam         Assessment & Plan:

## 2018-11-24 DIAGNOSIS — D485 Neoplasm of uncertain behavior of skin: Secondary | ICD-10-CM | POA: Diagnosis not present

## 2018-12-10 ENCOUNTER — Telehealth: Payer: Self-pay | Admitting: Family Medicine

## 2018-12-10 NOTE — Telephone Encounter (Signed)
Good to hear - agree with cancelling visit. Just continue current med as up to now.

## 2018-12-10 NOTE — Telephone Encounter (Signed)
Pt's son called to cancel appt due to virus concerns and pt's health. He said pt's BP has been good- he is taking twice a day.130/80 average over the past couple of days. Pt is taking the atenolol twice a day currently. I canceled the appt but pt wants you to be aware and let them know if you have any concerns.

## 2018-12-10 NOTE — Telephone Encounter (Signed)
Spoke with pt's son, [name] (on dpr), relaying Dr. Synthia Innocent message and instructions. Verbalizes understanding.

## 2018-12-11 ENCOUNTER — Other Ambulatory Visit: Payer: Self-pay | Admitting: Family Medicine

## 2018-12-11 ENCOUNTER — Ambulatory Visit: Payer: Medicare Other | Admitting: Family Medicine

## 2019-02-08 ENCOUNTER — Telehealth: Payer: Self-pay | Admitting: Family Medicine

## 2019-02-08 MED ORDER — ATENOLOL 25 MG PO TABS: 25.0000 mg | ORAL_TABLET | Freq: Two times a day (BID) | ORAL | 1 refills | Status: DC

## 2019-02-08 NOTE — Telephone Encounter (Signed)
plz notify this was sent in. Thanks.

## 2019-02-08 NOTE — Telephone Encounter (Signed)
Pt's son, [name], called to request refill for atenolol. He said he requested a refill at [facility] but on file is 1 x day but it was changed to 2 x day at March OV by Dr. Danise Mina.  Pt has 2 doses left, will be out tomorrow morning.

## 2019-02-08 NOTE — Telephone Encounter (Signed)
Spoke with pt's son, [name] (on dpr), notifying him rx was sent to pharmacy. Verbalizes understanding.

## 2019-02-23 ENCOUNTER — Ambulatory Visit: Payer: Medicare Other | Admitting: Oncology

## 2019-02-23 ENCOUNTER — Other Ambulatory Visit: Payer: Medicare Other

## 2019-02-24 ENCOUNTER — Other Ambulatory Visit: Payer: Self-pay

## 2019-02-25 ENCOUNTER — Inpatient Hospital Stay: Payer: Medicare Other | Attending: Oncology

## 2019-02-25 ENCOUNTER — Other Ambulatory Visit: Payer: Self-pay

## 2019-02-25 DIAGNOSIS — D472 Monoclonal gammopathy: Secondary | ICD-10-CM | POA: Insufficient documentation

## 2019-02-25 DIAGNOSIS — C9110 Chronic lymphocytic leukemia of B-cell type not having achieved remission: Secondary | ICD-10-CM | POA: Diagnosis not present

## 2019-02-25 DIAGNOSIS — N189 Chronic kidney disease, unspecified: Secondary | ICD-10-CM | POA: Diagnosis not present

## 2019-02-25 LAB — BASIC METABOLIC PANEL
Anion gap: 7 (ref 5–15)
BUN: 32 mg/dL — ABNORMAL HIGH (ref 8–23)
CO2: 27 mmol/L (ref 22–32)
Calcium: 9.1 mg/dL (ref 8.9–10.3)
Chloride: 105 mmol/L (ref 98–111)
Creatinine, Ser: 1.54 mg/dL — ABNORMAL HIGH (ref 0.61–1.24)
GFR calc Af Amer: 45 mL/min — ABNORMAL LOW (ref >60)
GFR calc non Af Amer: 39 mL/min — ABNORMAL LOW (ref >60)
Glucose, Bld: 100 mg/dL — ABNORMAL HIGH (ref 70–99)
Potassium: 4.7 mmol/L (ref 3.5–5.1)
Sodium: 139 mmol/L (ref 135–145)

## 2019-02-25 LAB — CBC WITH DIFFERENTIAL/PLATELET
Abs Immature Granulocytes: 0.02 10*3/uL (ref 0.00–0.07)
Basophils Absolute: 0 10*3/uL (ref 0.0–0.1)
Basophils Relative: 0 %
Eosinophils Absolute: 0.1 10*3/uL (ref 0.0–0.5)
Eosinophils Relative: 2 %
HCT: 42.6 % (ref 39.0–52.0)
Hemoglobin: 13.9 g/dL (ref 13.0–17.0)
Immature Granulocytes: 0 %
Lymphocytes Relative: 24 %
Lymphs Abs: 1.9 10*3/uL (ref 0.7–4.0)
MCH: 31 pg (ref 26.0–34.0)
MCHC: 32.6 g/dL (ref 30.0–36.0)
MCV: 94.9 fL (ref 80.0–100.0)
Monocytes Absolute: 0.5 10*3/uL (ref 0.1–1.0)
Monocytes Relative: 7 %
Neutro Abs: 5.2 10*3/uL (ref 1.7–7.7)
Neutrophils Relative %: 67 %
Platelets: 267 10*3/uL (ref 150–400)
RBC: 4.49 MIL/uL (ref 4.22–5.81)
RDW: 13.8 % (ref 11.5–15.5)
WBC: 7.7 10*3/uL (ref 4.0–10.5)
nRBC: 0 % (ref 0.0–0.2)

## 2019-02-26 ENCOUNTER — Telehealth: Payer: Self-pay | Admitting: Oncology

## 2019-03-01 ENCOUNTER — Inpatient Hospital Stay: Payer: Medicare Other | Attending: Oncology | Admitting: Oncology

## 2019-03-01 ENCOUNTER — Encounter: Payer: Self-pay | Admitting: Oncology

## 2019-03-01 DIAGNOSIS — C9110 Chronic lymphocytic leukemia of B-cell type not having achieved remission: Secondary | ICD-10-CM

## 2019-03-01 NOTE — Progress Notes (Signed)
Switching from corcoran to rao. For CLL. He states he has no lymph nodes enlarged, no fevers. No pain. She is doing well

## 2019-03-01 NOTE — Progress Notes (Signed)
I connected with Harold Chavez on 03/01/19 at 11:15 AM EDT by video enabled telemedicine visit and verified that I am speaking with the correct person using two identifiers.   I discussed the limitations, risks, security and privacy concerns of performing an evaluation and management service by telemedicine and the availability of in-person appointments. I also discussed with the patient that there may be a patient responsible charge related to this service. The patient expressed understanding and agreed to proceed.  Other persons participating in the visit and their role in the encounter:  Patients grandson  Patient's location:  home Provider's location:  home  Chief Complaint:  Routine f/u of CLL   History of present illness: Patient is a 85 year old gentleman with history of CLL that was diagnosed in February 2015.  He presented with bulky axillary adenopathy and required treatment with bendamustine and Rituxan at that time.  He only completed 5 cycles due to neutropenia and hospitalization due to C. difficile.  Patient also noted to have elevated kappa light chain 677 back in 2015.  No M protein noted on myeloma panel.  Suspect this was secondary to CLL at that time.  He has remained in remission since then.  He is now transferring his care from Dr. Mike Gip to me  Interval history: Overall patient feels well and denies any symptoms of unintentional weight loss, fatigue or drenching night sweats.  His appetite is good and he denies any lumps or bumps anywhere.   Review of Systems  Constitutional: Negative for chills, fever, malaise/fatigue and weight loss.  HENT: Negative for congestion, ear discharge and nosebleeds.   Eyes: Negative for blurred vision.  Respiratory: Negative for cough, hemoptysis, sputum production, shortness of breath and wheezing.   Cardiovascular: Negative for chest pain, palpitations, orthopnea and claudication.  Gastrointestinal: Negative for abdominal pain, blood in  stool, constipation, diarrhea, heartburn, melena, nausea and vomiting.  Genitourinary: Negative for dysuria, flank pain, frequency, hematuria and urgency.  Musculoskeletal: Negative for back pain, joint pain and myalgias.  Skin: Negative for rash.  Neurological: Negative for dizziness, tingling, focal weakness, seizures, weakness and headaches.  Endo/Heme/Allergies: Does not bruise/bleed easily.  Psychiatric/Behavioral: Negative for depression and suicidal ideas. The patient does not have insomnia.     No Known Allergies  Past Medical History:  Diagnosis Date  . B12 deficiency anemia   . BCC (basal cell carcinoma of skin) 2017   L frontal scalp  . Benign paroxysmal positional vertigo   . C. difficile diarrhea 07/14/2014   2015   . Chronic kidney disease   . History of pneumonia 07/14/2014   2015   . History of shingles 2010  . IgG gammopathy   . 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 Leukemia ([place])    CLL  . 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 Sarcoidosis   . Sensorineural hearing loss (SNHL) of both ears 10/2015   haering aide eval rec [facility])  . Squamous cell carcinoma in situ of skin 2017   R forearm, nose, R cheek - [place]    Past Surgical History:  Procedure Laterality Date  . MOHS SURGERY  2015   R cheek lentigo maligna in situ  . SKIN CANCER EXCISION  07/2018  . TESTICLE REMOVAL  1937   right; undescended    Social History   Socioeconomic History  . Marital status: Married    Spouse name: Not on file  .  Number of children: Not on file  . Years of education: Not on file  . Highest education level: Not on file  Occupational History  . Not on file  Social Needs  . Financial resource strain: Not on file  . Food insecurity:    Worry: Not on file    Inability: Not on file  . Transportation needs:    Medical: Not on file    Non-medical: Not on file  Tobacco Use  . Smoking status:  Never Smoker  . Smokeless tobacco: Never Used  Substance and Sexual Activity  . Alcohol use: No  . Drug use: No  . Sexual activity: Never  Lifestyle  . Physical activity:    Days per week: Not on file    Minutes per session: Not on file  . Stress: Not on file  Relationships  . Social connections:    Talks on phone: Not on file    Gets together: Not on file    Attends religious service: Not on file    Active member of club or organization: Not on file    Attends meetings of clubs or organizations: Not on file    Relationship status: Not on file  . Intimate partner violence:    Fear of current or ex partner: Not on file    Emotionally abused: Not on file    Physically abused: Not on file    Forced sexual activity: Not on file  Other Topics Concern  . 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 Cancer Neg Hx   . Stroke Neg Hx   . CAD Neg Hx   . Diabetes Neg Hx      Current Outpatient Medications:  .  atenolol (TENORMIN) 25 MG tablet, Take 1 tablet (25 mg total) by mouth 2 (two) times daily., Disp: 180 tablet, Rfl: 1 .  cyanocobalamin (V-R VITAMIN B-12) 500 MCG tablet, Take 1 tablet (500 mcg total) by mouth daily. (Patient taking differently: Take 500 mcg by mouth every other day. ), Disp: , Rfl:   No results found.  No images are attached to the encounter.   CMP Latest Ref Rng & Units 02/25/2019  Glucose 70 - 99 mg/dL 100(H)  BUN 8 - 23 mg/dL 32(H)  Creatinine 0.61 - 1.24 mg/dL 1.54(H)  Sodium 135 - 145 mmol/L 139  Potassium 3.5 - 5.1 mmol/L 4.7  Chloride 98 - 111 mmol/L 105  CO2 22 - 32 mmol/L 27  Calcium 8.9 - 10.3 mg/dL 9.1  Total Protein 6.5 - 8.1 g/dL -  Total Bilirubin 0.3 - 1.2 mg/dL -  Alkaline Phos 38 - 126 U/L -  AST 15 - 41 U/L -  ALT 0 - 44  U/L -   CBC Latest Ref Rng & Units 02/25/2019  WBC 4.0 - 10.5 K/uL 7.7  Hemoglobin 13.0 - 17.0 g/dL 13.9  Hematocrit 39.0 - 52.0 % 42.6  Platelets 150 - 400 K/uL 267     Observation/objective: Appears in no acute distress of a video visit today.  Breathing is nonlabored  Assessment and plan: Patient is a 85 year old male with a history of CLL in 2015 requiring treatment with bendamustine and Rituxan.  He is currently in remission and  under observation  No suspicious symptoms of recurrence.  CBC with differential is within normal limits and no evidence of cytopenias.  I suspect that the elevated kappa light chain that he had back in 2015 was secondary to CLL as CLL can produce light chains.  Myeloma panel presently shows no M protein when checked 6 months ago.  He has baseline CKD and his creatinine is around 1.5 which is at his baseline  Follow-up instructions: I will see him back in 6 months with a CBC with differential, CMP and serum free light chains  I discussed the assessment and treatment plan with the patient. The patient was provided an opportunity to ask questions and all were answered. The patient agreed with the plan and demonstrated an understanding of the instructions.   The patient was advised to call back or seek an in-person evaluation if the symptoms worsen or if the condition fails to improve as anticipated  Visit Diagnosis: No diagnosis found.  Dr. Randa Evens, MD, MPH [place] [facility] [phone] 03/01/2019 1:06 PM

## 2019-03-02 ENCOUNTER — Inpatient Hospital Stay: Payer: Medicare Other | Admitting: Oncology

## 2019-07-21 DIAGNOSIS — D1801 Hemangioma of skin and subcutaneous tissue: Secondary | ICD-10-CM | POA: Diagnosis not present

## 2019-07-21 DIAGNOSIS — L720 Epidermal cyst: Secondary | ICD-10-CM | POA: Diagnosis not present

## 2019-07-21 DIAGNOSIS — Z85828 Personal history of other malignant neoplasm of skin: Secondary | ICD-10-CM | POA: Diagnosis not present

## 2019-07-21 DIAGNOSIS — Z85820 Personal history of malignant melanoma of skin: Secondary | ICD-10-CM | POA: Diagnosis not present

## 2019-07-21 DIAGNOSIS — L814 Other melanin hyperpigmentation: Secondary | ICD-10-CM | POA: Diagnosis not present

## 2019-07-21 DIAGNOSIS — L821 Other seborrheic keratosis: Secondary | ICD-10-CM | POA: Diagnosis not present

## 2019-07-21 DIAGNOSIS — L819 Disorder of pigmentation, unspecified: Secondary | ICD-10-CM | POA: Diagnosis not present

## 2019-07-21 DIAGNOSIS — L570 Actinic keratosis: Secondary | ICD-10-CM | POA: Diagnosis not present

## 2019-07-21 DIAGNOSIS — D229 Melanocytic nevi, unspecified: Secondary | ICD-10-CM | POA: Diagnosis not present

## 2019-08-01 ENCOUNTER — Other Ambulatory Visit: Payer: Self-pay | Admitting: Family Medicine

## 2019-08-31 ENCOUNTER — Other Ambulatory Visit: Payer: Self-pay

## 2019-08-31 ENCOUNTER — Inpatient Hospital Stay (HOSPITAL_BASED_OUTPATIENT_CLINIC_OR_DEPARTMENT_OTHER): Payer: Medicare Other | Admitting: Oncology

## 2019-08-31 ENCOUNTER — Inpatient Hospital Stay: Payer: Medicare Other | Attending: Oncology

## 2019-08-31 ENCOUNTER — Encounter: Payer: Self-pay | Admitting: Oncology

## 2019-08-31 ENCOUNTER — Inpatient Hospital Stay: Payer: Medicare Other | Admitting: Oncology

## 2019-08-31 DIAGNOSIS — C9111 Chronic lymphocytic leukemia of B-cell type in remission: Secondary | ICD-10-CM | POA: Insufficient documentation

## 2019-08-31 DIAGNOSIS — Z85820 Personal history of malignant melanoma of skin: Secondary | ICD-10-CM

## 2019-08-31 DIAGNOSIS — C9110 Chronic lymphocytic leukemia of B-cell type not having achieved remission: Secondary | ICD-10-CM | POA: Diagnosis not present

## 2019-08-31 LAB — BASIC METABOLIC PANEL
Anion gap: 7 (ref 5–15)
BUN: 33 mg/dL — ABNORMAL HIGH (ref 8–23)
CO2: 26 mmol/L (ref 22–32)
Calcium: 9.1 mg/dL (ref 8.9–10.3)
Chloride: 105 mmol/L (ref 98–111)
Creatinine, Ser: 1.5 mg/dL — ABNORMAL HIGH (ref 0.61–1.24)
GFR calc Af Amer: 46 mL/min — ABNORMAL LOW (ref >60)
GFR calc non Af Amer: 40 mL/min — ABNORMAL LOW (ref >60)
Glucose, Bld: 106 mg/dL — ABNORMAL HIGH (ref 70–99)
Potassium: 4.5 mmol/L (ref 3.5–5.1)
Sodium: 138 mmol/L (ref 135–145)

## 2019-08-31 LAB — CBC WITH DIFFERENTIAL/PLATELET
Abs Immature Granulocytes: 0.02 10*3/uL (ref 0.00–0.07)
Basophils Absolute: 0 10*3/uL (ref 0.0–0.1)
Basophils Relative: 0 %
Eosinophils Absolute: 0.1 10*3/uL (ref 0.0–0.5)
Eosinophils Relative: 1 %
HCT: 40.2 % (ref 39.0–52.0)
Hemoglobin: 13.1 g/dL (ref 13.0–17.0)
Immature Granulocytes: 0 %
Lymphocytes Relative: 31 %
Lymphs Abs: 2.3 10*3/uL (ref 0.7–4.0)
MCH: 31.6 pg (ref 26.0–34.0)
MCHC: 32.6 g/dL (ref 30.0–36.0)
MCV: 96.9 fL (ref 80.0–100.0)
Monocytes Absolute: 0.5 10*3/uL (ref 0.1–1.0)
Monocytes Relative: 6 %
Neutro Abs: 4.6 10*3/uL (ref 1.7–7.7)
Neutrophils Relative %: 62 %
Platelets: 225 10*3/uL (ref 150–400)
RBC: 4.15 MIL/uL — ABNORMAL LOW (ref 4.22–5.81)
RDW: 14 % (ref 11.5–15.5)
WBC: 7.6 10*3/uL (ref 4.0–10.5)
nRBC: 0 % (ref 0.0–0.2)

## 2019-08-31 NOTE — Progress Notes (Signed)
Patient stated that he had been doing well with no concerns. 

## 2019-09-03 NOTE — Progress Notes (Signed)
I connected with Harold Chavez on 09/03/19 at  3:30 PM EST by video enabled telemedicine visit and verified that I am speaking with the correct person using two identifiers.   I discussed the limitations, risks, security and privacy concerns of performing an evaluation and management service by telemedicine and the availability of in-person appointments. I also discussed with the patient that there may be a patient responsible charge related to this service. The patient expressed understanding and agreed to proceed.  Other persons participating in the visit and their role in the encounter:  Patients son  Patient's location:  home Provider's location:  work  Risk analyst Complaint: Routine follow-up of CLL Diagnosis: Monoclonal B-cell lymphocytosis/CLL  History of present illness: Patient is a 85 year old gentleman with history of CLL that was diagnosed in February 2015.  He presented with bulky axillary adenopathy and required treatment with bendamustine and Rituxan at that time.  He only completed 5 cycles due to neutropenia and hospitalization due to C. difficile.  Patient also noted to have elevated kappa light chain 677 back in 2015.  No M protein noted on myeloma panel.  Suspect this was secondary to CLL at that time.  He has remained in remission since then.    Interval history patient reports doing well.  Appetite and weight are stable.  Denies any unusual fatigue, unintentional weight loss, drenching night sweats.  Denies any abdominal pain or discomfort or lumps or bumps anywhere.   Review of Systems  Constitutional: Negative for chills, fever, malaise/fatigue and weight loss.  HENT: Negative for congestion, ear discharge and nosebleeds.   Eyes: Negative for blurred vision.  Respiratory: Negative for cough, hemoptysis, sputum production, shortness of breath and wheezing.   Cardiovascular: Negative for chest pain, palpitations, orthopnea and claudication.  Gastrointestinal: Negative for  abdominal pain, blood in stool, constipation, diarrhea, heartburn, melena, nausea and vomiting.  Genitourinary: Negative for dysuria, flank pain, frequency, hematuria and urgency.  Musculoskeletal: Negative for back pain, joint pain and myalgias.  Skin: Negative for rash.  Neurological: Negative for dizziness, tingling, focal weakness, seizures, weakness and headaches.  Endo/Heme/Allergies: Does not bruise/bleed easily.  Psychiatric/Behavioral: Negative for depression and suicidal ideas. The patient does not have insomnia.     No Known Allergies  Past Medical History:  Diagnosis Date  . B12 deficiency anemia   . BCC (basal cell carcinoma of skin) 2017   L frontal scalp  . Benign paroxysmal positional vertigo   . C. difficile diarrhea 07/14/2014   2015   . Chronic kidney disease   . History of pneumonia 07/14/2014   2015   . History of shingles 2010  . IgG gammopathy   . 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 Leukemia ([place])    CLL  . 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 Sarcoidosis   . Sensorineural hearing loss (SNHL) of both ears 10/2015   haering aide eval rec through [facility])  . Squamous cell carcinoma in situ of skin 2017   R forearm, nose, R cheek - [place]    Past Surgical History:  Procedure Laterality Date  . MOHS SURGERY  2015   R cheek lentigo maligna in situ  . SKIN CANCER EXCISION  07/2018  . TESTICLE REMOVAL  1937   right; undescended    Social History   Socioeconomic History  . Marital status: Married    Spouse name: Not on file  . Number of children:  Not on file  . Years of education: Not on file  . Highest education level: Not on file  Occupational History  . Not on file  Social Needs  . Financial resource strain: Not on file  . Food insecurity    Worry: Not on file    Inability: Not on file  . Transportation needs    Medical: Not on file    Non-medical: Not on file  Tobacco Use   . Smoking status: Never Smoker  . Smokeless tobacco: Never Used  Substance and Sexual Activity  . Alcohol use: No  . Drug use: No  . Sexual activity: Never  Lifestyle  . Physical activity    Days per week: Not on file    Minutes per session: Not on file  . Stress: Not on file  Relationships  . Social Herbalist on phone: Not on file    Gets together: Not on file    Attends religious service: Not on file    Active member of club or organization: Not on file    Attends meetings of clubs or organizations: Not on file    Relationship status: Not on file  . Intimate partner violence    Fear of current or ex partner: Not on file    Emotionally abused: Not on file    Physically abused: Not on file    Forced sexual activity: Not on file  Other Topics Concern  . 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 Cancer Neg Hx   . Stroke Neg Hx   . CAD Neg Hx   . Diabetes Neg Hx      Current Outpatient Medications:  .  atenolol (TENORMIN) 25 MG tablet, TAKE 1 TABLET BY MOUTH TWICE A DAY, Disp: 180 tablet, Rfl: 1 .  cyanocobalamin (V-R VITAMIN B-12) 500 MCG tablet, Take 1 tablet (500 mcg total) by mouth daily. (Patient taking differently: Take 500 mcg by mouth every other day. ), Disp: , Rfl:   No results found.  No images are attached to the encounter.   CMP Latest Ref Rng & Units 08/31/2019  Glucose 70 - 99 mg/dL 106(H)  BUN 8 - 23 mg/dL 33(H)  Creatinine 0.61 - 1.24 mg/dL 1.50(H)  Sodium 135 - 145 mmol/L 138  Potassium 3.5 - 5.1 mmol/L 4.5  Chloride 98 - 111 mmol/L 105  CO2 22 - 32 mmol/L 26  Calcium 8.9 - 10.3 mg/dL 9.1  Total Protein 6.5 - 8.1 g/dL -  Total Bilirubin 0.3 - 1.2 mg/dL -  Alkaline Phos 38 - 126 U/L -  AST 15 - 41 U/L -  ALT 0 - 44 U/L -    CBC Latest Ref Rng & Units 08/31/2019  WBC 4.0 - 10.5 K/uL 7.6  Hemoglobin 13.0 - 17.0 g/dL 13.1  Hematocrit 39.0 - 52.0 % 40.2  Platelets 150 - 400 K/uL 225     Observation/objective: Appears in no acute distress of a video visit today.  Breathing is nonlabored  Assessment and plan: Patient is a 85 year old male with a history of CLL in 2015 requiring treatment with bendamustine and Rituxan.  He is currently in remission under observation  Patient's white cell count is  normal.  No anemia or thrombocytopenia.  No B symptoms.  Patient was not brought for in person evaluation due to Covid pandemic.  Follow-up instructions: I will see him back in 1 years time with labs CBC with differential, CMP  I discussed the assessment and treatment plan with the patient. The patient was provided an opportunity to ask questions and all were answered. The patient agreed with the plan and demonstrated an understanding of the instructions.   The patient was advised to call back or seek an in-person evaluation if the symptoms worsen or if the condition fails to improve as anticipated.   Visit Diagnosis: 1. Chronic lymphocytic leukemia ([place])     Dr. Randa Evens, MD, MPH [facility] at [facility] [phone] 09/03/2019 8:18 AM

## 2019-11-09 ENCOUNTER — Other Ambulatory Visit: Payer: Self-pay | Admitting: Family Medicine

## 2019-11-09 DIAGNOSIS — D869 Sarcoidosis, unspecified: Secondary | ICD-10-CM

## 2019-11-09 DIAGNOSIS — I10 Essential (primary) hypertension: Secondary | ICD-10-CM

## 2019-11-09 DIAGNOSIS — D531 Other megaloblastic anemias, not elsewhere classified: Secondary | ICD-10-CM

## 2019-11-09 DIAGNOSIS — C9110 Chronic lymphocytic leukemia of B-cell type not having achieved remission: Secondary | ICD-10-CM

## 2019-11-09 DIAGNOSIS — N1832 Chronic kidney disease, stage 3b: Secondary | ICD-10-CM

## 2019-11-11 ENCOUNTER — Other Ambulatory Visit (INDEPENDENT_AMBULATORY_CARE_PROVIDER_SITE_OTHER): Payer: Medicare Other

## 2019-11-11 ENCOUNTER — Ambulatory Visit (INDEPENDENT_AMBULATORY_CARE_PROVIDER_SITE_OTHER): Payer: Medicare Other

## 2019-11-11 ENCOUNTER — Other Ambulatory Visit: Payer: Self-pay

## 2019-11-11 DIAGNOSIS — N1832 Chronic kidney disease, stage 3b: Secondary | ICD-10-CM

## 2019-11-11 DIAGNOSIS — I10 Essential (primary) hypertension: Secondary | ICD-10-CM

## 2019-11-11 DIAGNOSIS — D531 Other megaloblastic anemias, not elsewhere classified: Secondary | ICD-10-CM | POA: Diagnosis not present

## 2019-11-11 DIAGNOSIS — Z0000 Encounter for general adult medical examination without abnormal findings: Secondary | ICD-10-CM

## 2019-11-11 LAB — VITAMIN D 25 HYDROXY (VIT D DEFICIENCY, FRACTURES): VITD: 11.2 ng/mL — ABNORMAL LOW (ref 30.00–100.00)

## 2019-11-11 LAB — COMPREHENSIVE METABOLIC PANEL
ALT: 8 U/L (ref 0–53)
AST: 15 U/L (ref 0–37)
Albumin: 4 g/dL (ref 3.5–5.2)
Alkaline Phosphatase: 70 U/L (ref 39–117)
BUN: 33 mg/dL — ABNORMAL HIGH (ref 6–23)
CO2: 29 mEq/L (ref 19–32)
Calcium: 9.4 mg/dL (ref 8.4–10.5)
Chloride: 104 mEq/L (ref 96–112)
Creatinine, Ser: 1.59 mg/dL — ABNORMAL HIGH (ref 0.40–1.50)
GFR: 40.8 mL/min — ABNORMAL LOW (ref >60.00)
Glucose, Bld: 93 mg/dL (ref 70–99)
Potassium: 4.9 mEq/L (ref 3.5–5.1)
Sodium: 140 mEq/L (ref 135–145)
Total Bilirubin: 0.8 mg/dL (ref 0.2–1.2)
Total Protein: 7.5 g/dL (ref 6.0–8.3)

## 2019-11-11 LAB — VITAMIN B12: Vitamin B-12: 346 pg/mL (ref 211–911)

## 2019-11-11 LAB — MICROALBUMIN / CREATININE URINE RATIO
Creatinine,U: 74.6 mg/dL
Microalb Creat Ratio: 29 mg/g (ref 0.0–30.0)
Microalb, Ur: 21.6 mg/dL — ABNORMAL HIGH (ref 0.0–1.9)

## 2019-11-11 NOTE — Progress Notes (Signed)
Subjective:   JAQUORI HUNSINGER is a 86 y.o. male who presents for Medicare Annual/Subsequent preventive examination.  Review of Systems: N/A   This visit is being conducted through telemedicine via telephone at the nurse health advisor's home address due to the COVID-19 pandemic. This patient has given me verbal consent via doximity to conduct this visit, patient states they are participating from their home address. Patient and myself are on the telephone call. There is no referral for this visit. Some vital signs may be absent or patient reported.    Patient identification: identified by name, DOB, and current address   Cardiac Risk Factors include: advanced age (>7men, >71 women);hypertension;male gender     Objective:    Vitals: There were no vitals taken for this visit.  There is no height or weight on file to calculate BMI.  Advanced Directives 11/11/2019 08/31/2019 03/01/2019 11/11/2018 08/25/2018 02/26/2018 11/03/2017  Does Patient Have a Medical Advance Directive? Yes Yes Yes Yes Yes No Yes  Type of Paramedic of [place];Living will Living will;Healthcare Power of [place];Living will [place];Living will - [place];Living will  Does patient want to make changes to medical advance directive? - No - Patient declined No - Patient declined - - - -  Copy of [place] in Chart? No - copy requested No - copy requested No - copy requested No - copy requested No - copy requested - No - copy requested  Would patient like information on creating a medical advance directive? - - - - - No - Patient declined -    Tobacco Social History   Tobacco Use  Smoking Status Never Smoker  Smokeless Tobacco Never Used     Counseling given: Not Answered   Clinical Intake:  Pre-visit preparation completed: Yes  Pain : No/denies pain     Nutritional Risks:  None Diabetes: No  How often do you need to have someone help you when you read instructions, pamphlets, or other written materials from your doctor or pharmacy?: 1 - Never What is the last grade level you completed in school?: 11th  Interpreter Needed?: No  Information entered by :: CJohnson, LPN  Past Medical History:  Diagnosis Date  . B12 deficiency anemia   . BCC (basal cell carcinoma of skin) 2017   L frontal scalp  . Benign paroxysmal positional vertigo   . C. difficile diarrhea 07/14/2014   2015   . Chronic kidney disease   . History of pneumonia 07/14/2014   2015   . History of shingles 2010  . IgG gammopathy   . 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 Leukemia ([place])    CLL  . 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 Sarcoidosis   . Sensorineural hearing loss (SNHL) of both ears 10/2015   haering aide eval rec through [facility])  . Squamous cell carcinoma in situ of skin 2017   R forearm, nose, R cheek - [place]   Past Surgical History:  Procedure Laterality Date  . MOHS SURGERY  2015   R cheek lentigo maligna in situ  . SKIN CANCER EXCISION  07/2018  . TESTICLE REMOVAL  1937   right; undescended   Family History  Problem Relation Age of Onset  . Cancer Neg Hx   . Stroke Neg Hx   . CAD Neg  Hx   . Diabetes Neg Hx    Social History   Socioeconomic History  . Marital status: Married    Spouse name: Not on file  . Number of children: Not on file  . Years of education: Not on file  . Highest education level: Not on file  Occupational History  . Not on file  Tobacco Use  . Smoking status: Never Smoker  . Smokeless tobacco: Never Used  Substance and Sexual Activity  . Alcohol use: No  . Drug use: No  . Sexual activity: Never  Other Topics Concern  . 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 Difficulty of Paying Living Expenses: Not hard at all  Food Insecurity: No Food Insecurity  . Worried About Charity fundraiser in the Last Year: Never true  . Ran Out of Food in the Last Year: Never true  Transportation Needs: No Transportation Needs  . Lack of Transportation (Medical): No  . Lack of Transportation (Non-Medical): No  Physical Activity: Sufficiently Active  . Days of Exercise per Week: 7 days  . Minutes of Exercise per Session: 60 min  Stress: No Stress Concern Present  . Feeling of Stress : Not at all  Social Connections:   . Frequency of Communication with Friends and Family: Not on file  . Frequency of Social Gatherings with Friends and Family: Not on file  . Attends Religious Services: Not on file  . Active Member of Clubs or Organizations: Not on file  . Attends Archivist Meetings: Not on file  . Marital Status: Not on file    Outpatient Encounter Medications as of 11/11/2019  Medication Sig  . atenolol (TENORMIN) 25 MG tablet TAKE 1 TABLET BY MOUTH TWICE A DAY  . cyanocobalamin (V-R VITAMIN B-12) 500 MCG tablet Take 1 tablet (500 mcg total) by mouth daily. (Patient taking differently: Take 500 mcg by mouth every other day. )   No facility-administered encounter medications on file as of 11/11/2019.    Activities of Daily Living In your present state of health, do you have any difficulty performing the following activities: 11/11/2019 11/11/2018  Hearing? Y Y  Comment some hearing loss -  Vision? N N  Difficulty concentrating or making decisions? N N  Walking or climbing stairs? N Y  Dressing or bathing? N N  Doing errands, shopping? N Y  Conservation officer, nature and eating ? N N  Using the Toilet? N N  In the past six months, have you accidently leaked urine? N N  Do you have problems with loss of bowel control? N N    Managing your Medications? N N  Managing your Finances? N N  Housekeeping or managing your Housekeeping? N N  Some recent data might be hidden    Patient Care Team: Ria Bush, MD as PCP - General (Family Medicine) Bary Castilla, Forest Gleason, MD (General Surgery) Lequita Asal, MD as Referring Physician (Hematology and Oncology) Ralene Bathe, MD as Consulting Physician (Ophthalmology)   Assessment:   This is a routine wellness examination for [facility].  Exercise Activities and Dietary recommendations Current Exercise Habits: Home exercise routine, Type of exercise: stretching;walking, Time (Minutes): 60, Frequency (Times/Week): 7,  Weekly Exercise (Minutes/Week): 420, Intensity: Moderate, Exercise limited by: None identified  Goals    . Increase physical activity     Starting 11/11/2018, I will continue to exercise for 40-60 minutes daily     . Patient Stated     11/11/2019, I will continue to exercise for 40-60 minutes daily.       Fall Risk Fall Risk  11/11/2019 11/11/2018 11/03/2017 10/11/2016 10/16/2015  Falls in the past year? 0 0 No Yes No  Comment - - - pt had accidental fall in home -  Number falls in past yr: 0 - - 1 -  Injury with Fall? 0 - - No -  Risk for fall due to : Medication side effect - - - -  Follow up Falls evaluation completed;Falls prevention discussed - - - -   Is the patient's home free of loose throw rugs in walkways, pet beds, electrical cords, etc?   yes      Grab bars in the bathroom? yes      Handrails on the stairs?   yes      Adequate lighting?   yes  Timed Get Up and Go Performed: N/A  Depression Screen PHQ 2/9 Scores 11/11/2019 11/11/2018 11/03/2017 10/11/2016  PHQ - 2 Score 0 0 0 0  PHQ- 9 Score 0 0 0 -    Cognitive Function MMSE - Mini Mental State Exam 11/11/2019 11/11/2018 11/03/2017 10/11/2016  Orientation to time 5 5 5 5   Orientation to Place 5 5 5 5   Registration 3 3 3 3   Attention/ Calculation 5 0 0 3  Recall 3 3 3  0  Language-  name 2 objects - 0 0 0  Language- repeat 1 1 1 1   Language- follow 3 step command - 3 3 3   Language- read & follow direction - 0 0 0  Write a sentence - 0 0 0  Copy design - 0 0 0  Total score - 20 20 20   Mini Cog  Mini-Cog screen was completed. Maximum score is 22. A value of 0 denotes this part of the MMSE was not completed or the patient failed this part of the Mini-Cog screening.       Immunization History  Administered Date(s) Administered  . Fluad Quad(high Dose 65+) 06/18/2019  . Influenza, High Dose Seasonal PF 08/29/2015, 07/26/2018  . Influenza,inj,Quad PF,6+ Mos 06/23/2017  . Influenza-Unspecified 05/31/2014, 06/30/2016  . Pneumococcal Conjugate-13 10/16/2015  . Pneumococcal Polysaccharide-23 05/31/2014  . Td 03/02/2007    Qualifies for Shingles Vaccine? Yes  Screening Tests Health Maintenance  Topic Date Due  . DTaP/Tdap/Td (2 - Tdap) 09/29/2020 (Originally 03/01/2017)  . TETANUS/TDAP  09/29/2020 (Originally 03/01/2017)  . DTAP VACCINES (1) 11/10/2021 (Originally 08/06/1924)  . INFLUENZA VACCINE  Completed  . PNA vac Low Risk Adult  Completed   Cancer Screenings: Lung: Low Dose CT Chest recommended if Age 71-80 years, 30 pack-year currently smoking OR have quit w/in 15years. Patient does not qualify. Colorectal: no longer required  Additional Screenings:  Hepatitis C Screening: N/A      Plan:   Patient will continue to exercise for 40-60 minutes daily.   I have personally reviewed and noted the following in the patient's chart:   . Medical and social history . Use of alcohol, tobacco or illicit drugs  . Current medications and supplements . Functional ability and status . Nutritional status . Physical activity . Advanced directives . List of other physicians . Hospitalizations, surgeries, and ER visits in  previous 12 months . Vitals . Screenings to include cognitive, depression, and falls . Referrals and appointments  In addition, I have reviewed  and discussed with patient certain preventive protocols, quality metrics, and best practice recommendations. A written personalized care plan for preventive services as well as general preventive health recommendations were provided to patient.     Andrez Grime, LPN  [id number]

## 2019-11-11 NOTE — Progress Notes (Signed)
PCP notes:  Health Maintenance: Tdap- insurance/financial  Abnormal Screenings: none   Patient concerns: none   Nurse concerns: none   Next PCP appt.: 11/18/2019 @ 9:30 am

## 2019-11-11 NOTE — Patient Instructions (Signed)
Harold Chavez , Thank you for taking time to come for your Medicare Wellness Visit. I appreciate your ongoing commitment to your health goals. Please review the following plan we discussed and let me know if I can assist you in the future.   Screening recommendations/referrals: Colonoscopy: no longer required Recommended yearly ophthalmology/optometry visit for glaucoma screening and checkup Recommended yearly dental visit for hygiene and checkup  Vaccinations: Influenza vaccine: Up to date, completed 06/18/2019 Pneumococcal vaccine: Completed series Tdap vaccine: decline Shingles vaccine: discussed    Advanced directives: Please bring a copy of your POA (Power of Attorney) and/or Living Will to your next appointment.   Conditions/risks identified: hypertension  Next appointment: 11/18/2019 @ 9:30 am   Preventive Care 65 Years and Older, Male Preventive care refers to lifestyle choices and visits with your health care provider that can promote health and wellness. What does preventive care include?  A yearly physical exam. This is also called an annual well check.  Dental exams once or twice a year.  Routine eye exams. Ask your health care provider how often you should have your eyes checked.  Personal lifestyle choices, including:  Daily care of your teeth and gums.  Regular physical activity.  Eating a healthy diet.  Avoiding tobacco and drug use.  Limiting alcohol use.  Practicing safe sex.  Taking low doses of aspirin every day.  Taking vitamin and mineral supplements as recommended by your health care provider. What happens during an annual well check? The services and screenings done by your health care provider during your annual well check will depend on your age, overall health, lifestyle risk factors, and family history of disease. Counseling  Your health care provider may ask you questions about your:  Alcohol use.  Tobacco use.  Drug use.  Emotional  well-being.  Home and relationship well-being.  Sexual activity.  Eating habits.  History of falls.  Memory and ability to understand (cognition).  Work and work Statistician. Screening  You may have the following tests or measurements:  Height, weight, and BMI.  Blood pressure.  Lipid and cholesterol levels. These may be checked every 5 years, or more frequently if you are over 68 years old.  Skin check.  Lung cancer screening. You may have this screening every year starting at age 34 if you have a 30-pack-year history of smoking and currently smoke or have quit within the past 15 years.  Fecal occult blood test (FOBT) of the stool. You may have this test every year starting at age 39.  Flexible sigmoidoscopy or colonoscopy. You may have a sigmoidoscopy every 5 years or a colonoscopy every 10 years starting at age 46.  Prostate cancer screening. Recommendations will vary depending on your family history and other risks.  Hepatitis C blood test.  Hepatitis B blood test.  Sexually transmitted disease (STD) testing.  Diabetes screening. This is done by checking your blood sugar (glucose) after you have not eaten for a while (fasting). You may have this done every 1-3 years.  Abdominal aortic aneurysm (AAA) screening. You may need this if you are a current or former smoker.  Osteoporosis. You may be screened starting at age 64 if you are at high risk. Talk with your health care provider about your test results, treatment options, and if necessary, the need for more tests. Vaccines  Your health care provider may recommend certain vaccines, such as:  Influenza vaccine. This is recommended every year.  Tetanus, diphtheria, and acellular pertussis (Tdap, Td)  vaccine. You may need a Td booster every 10 years.  Zoster vaccine. You may need this after age 29.  Pneumococcal 13-valent conjugate (PCV13) vaccine. One dose is recommended after age 72.  Pneumococcal  polysaccharide (PPSV23) vaccine. One dose is recommended after age 30. Talk to your health care provider about which screenings and vaccines you need and how often you need them. This information is not intended to replace advice given to you by your health care provider. Make sure you discuss any questions you have with your health care provider. Document Released: 10/13/2015 Document Revised: 06/05/2016 Document Reviewed: 07/18/2015 Elsevier Interactive Patient Education  2017 [employer] Prevention in the Home Falls can cause injuries. They can happen to people of all ages. There are many things you can do to make your home safe and to help prevent falls. What can I do on the outside of my home?  Regularly fix the edges of walkways and driveways and fix any cracks.  Remove anything that might make you trip as you walk through a door, such as a raised step or threshold.  Trim any bushes or trees on the path to your home.  Use bright outdoor lighting.  Clear any walking paths of anything that might make someone trip, such as rocks or tools.  Regularly check to see if handrails are loose or broken. Make sure that both sides of any steps have handrails.  Any raised decks and porches should have guardrails on the edges.  Have any leaves, snow, or ice cleared regularly.  Use sand or salt on walking paths during winter.  Clean up any spills in your garage right away. This includes oil or grease spills. What can I do in the bathroom?  Use night lights.  Install grab bars by the toilet and in the tub and shower. Do not use towel bars as grab bars.  Use non-skid mats or decals in the tub or shower.  If you need to sit down in the shower, use a plastic, non-slip stool.  Keep the floor dry. Clean up any water that spills on the floor as soon as it happens.  Remove soap buildup in the tub or shower regularly.  Attach bath mats securely with double-sided non-slip rug  tape.  Do not have throw rugs and other things on the floor that can make you trip. What can I do in the bedroom?  Use night lights.  Make sure that you have a light by your bed that is easy to reach.  Do not use any sheets or blankets that are too big for your bed. They should not hang down onto the floor.  Have a firm chair that has side arms. You can use this for support while you get dressed.  Do not have throw rugs and other things on the floor that can make you trip. What can I do in the kitchen?  Clean up any spills right away.  Avoid walking on wet floors.  Keep items that you use a lot in easy-to-reach places.  If you need to reach something above you, use a strong step stool that has a grab bar.  Keep electrical cords out of the way.  Do not use floor polish or wax that makes floors slippery. If you must use wax, use non-skid floor wax.  Do not have throw rugs and other things on the floor that can make you trip. What can I do with my stairs?  Do not leave  any items on the stairs.  Make sure that there are handrails on both sides of the stairs and use them. Fix handrails that are broken or loose. Make sure that handrails are as long as the stairways.  Check any carpeting to make sure that it is firmly attached to the stairs. Fix any carpet that is loose or worn.  Avoid having throw rugs at the top or bottom of the stairs. If you do have throw rugs, attach them to the floor with carpet tape.  Make sure that you have a light switch at the top of the stairs and the bottom of the stairs. If you do not have them, ask someone to add them for you. What else can I do to help prevent falls?  Wear shoes that:  Do not have high heels.  Have rubber bottoms.  Are comfortable and fit you well.  Are closed at the toe. Do not wear sandals.  If you use a stepladder:  Make sure that it is fully opened. Do not climb a closed stepladder.  Make sure that both sides of the  stepladder are locked into place.  Ask someone to hold it for you, if possible.  Clearly mark and make sure that you can see:  Any grab bars or handrails.  First and last steps.  Where the edge of each step is.  Use tools that help you move around (mobility aids) if they are needed. These include:  Canes.  Walkers.  Scooters.  Crutches.  Turn on the lights when you go into a dark area. Replace any light bulbs as soon as they burn out.  Set up your furniture so you have a clear path. Avoid moving your furniture around.  If any of your floors are uneven, fix them.  If there are any pets around you, be aware of where they are.  Review your medicines with your doctor. Some medicines can make you feel dizzy. This can increase your chance of falling. Ask your doctor what other things that you can do to help prevent falls. This information is not intended to replace advice given to you by your health care provider. Make sure you discuss any questions you have with your health care provider. Document Released: 07/13/2009 Document Revised: 02/22/2016 Document Reviewed: 10/21/2014 Elsevier Interactive Patient Education  2017 [employer].

## 2019-11-12 LAB — PARATHYROID HORMONE, INTACT (NO CA): PTH: 59 pg/mL (ref 14–64)

## 2019-11-18 ENCOUNTER — Ambulatory Visit: Payer: Medicare Other | Admitting: Family Medicine

## 2019-11-18 ENCOUNTER — Ambulatory Visit: Payer: Medicare Other

## 2019-11-23 ENCOUNTER — Encounter: Payer: Self-pay | Admitting: Family Medicine

## 2019-11-23 ENCOUNTER — Other Ambulatory Visit: Payer: Self-pay

## 2019-11-23 ENCOUNTER — Ambulatory Visit (INDEPENDENT_AMBULATORY_CARE_PROVIDER_SITE_OTHER): Payer: Medicare Other | Admitting: Family Medicine

## 2019-11-23 VITALS — BP 132/84 | HR 81 | Temp 97.6°F | Ht 68.25 in | Wt 147.4 lb

## 2019-11-23 DIAGNOSIS — I10 Essential (primary) hypertension: Secondary | ICD-10-CM | POA: Diagnosis not present

## 2019-11-23 DIAGNOSIS — D531 Other megaloblastic anemias, not elsewhere classified: Secondary | ICD-10-CM | POA: Diagnosis not present

## 2019-11-23 DIAGNOSIS — C9110 Chronic lymphocytic leukemia of B-cell type not having achieved remission: Secondary | ICD-10-CM | POA: Diagnosis not present

## 2019-11-23 DIAGNOSIS — R2231 Localized swelling, mass and lump, right upper limb: Secondary | ICD-10-CM | POA: Insufficient documentation

## 2019-11-23 DIAGNOSIS — D869 Sarcoidosis, unspecified: Secondary | ICD-10-CM

## 2019-11-23 DIAGNOSIS — Z7189 Other specified counseling: Secondary | ICD-10-CM

## 2019-11-23 DIAGNOSIS — N1832 Chronic kidney disease, stage 3b: Secondary | ICD-10-CM | POA: Diagnosis not present

## 2019-11-23 DIAGNOSIS — Z85820 Personal history of malignant melanoma of skin: Secondary | ICD-10-CM | POA: Diagnosis not present

## 2019-11-23 MED ORDER — ATENOLOL 25 MG PO TABS: 25.0000 mg | ORAL_TABLET | Freq: Two times a day (BID) | ORAL | 3 refills | Status: AC

## 2019-11-23 MED ORDER — VITAMIN D3 25 MCG (1000 UT) PO CAPS: 1.0000 | ORAL_CAPSULE | Freq: Every day | ORAL | Status: AC

## 2019-11-23 MED ORDER — CYANOCOBALAMIN 500 MCG PO TABS: 500.0000 ug | ORAL_TABLET | Freq: Every day | ORAL | Status: AC

## 2019-11-23 NOTE — Patient Instructions (Addendum)
I'm glad you got the covid vaccine  If interested, check with pharmacy about new 2 shot shingles series (shingrix).  Bring me copy of advanced directive to update chart.  Increase vitamin b12 to 523mcg daily Start vitamin D 1000 units daily.  You are doing well today, return as needed or in 1 year for next wellness visit  Health Maintenance After Age 84 After age 75, you are at a higher risk for certain long-term diseases and infections as well as injuries from falls. Falls are a major cause of broken bones and head injuries in people who are older than age 70. Getting regular preventive care can help to keep you healthy and well. Preventive care includes getting regular testing and making lifestyle changes as recommended by your health care provider. Talk with your health care provider about:  Which screenings and tests you should have. A screening is a test that checks for a disease when you have no symptoms.  A diet and exercise plan that is right for you. What should I know about screenings and tests to prevent falls? Screening and testing are the best ways to find a health problem early. Early diagnosis and treatment give you the best chance of managing medical conditions that are common after age 77. Certain conditions and lifestyle choices may make you more likely to have a fall. Your health care provider may recommend:  Regular vision checks. Poor vision and conditions such as cataracts can make you more likely to have a fall. If you wear glasses, make sure to get your prescription updated if your vision changes.  Medicine review. Work with your health care provider to regularly review all of the medicines you are taking, including over-the-counter medicines. Ask your health care provider about any side effects that may make you more likely to have a fall. Tell your health care provider if any medicines that you take make you feel dizzy or sleepy.  Osteoporosis screening. Osteoporosis is  a condition that causes the bones to get weaker. This can make the bones weak and cause them to break more easily.  Blood pressure screening. Blood pressure changes and medicines to control blood pressure can make you feel dizzy.  Strength and balance checks. Your health care provider may recommend certain tests to check your strength and balance while standing, walking, or changing positions.  Foot health exam. Foot pain and numbness, as well as not wearing proper footwear, can make you more likely to have a fall.  Depression screening. You may be more likely to have a fall if you have a fear of falling, feel emotionally low, or feel unable to do activities that you used to do.  Alcohol use screening. Using too much alcohol can affect your balance and may make you more likely to have a fall. What actions can I take to lower my risk of falls? General instructions  Talk with your health care provider about your risks for falling. Tell your health care provider if: ? You fall. Be sure to tell your health care provider about all falls, even ones that seem minor. ? You feel dizzy, sleepy, or off-balance.  Take over-the-counter and prescription medicines only as told by your health care provider. These include any supplements.  Eat a healthy diet and maintain a healthy weight. A healthy diet includes low-fat dairy products, low-fat (lean) meats, and fiber from whole grains, beans, and lots of fruits and vegetables. Home safety  Remove any tripping hazards, such as rugs, cords,  and clutter.  Install safety equipment such as grab bars in bathrooms and safety rails on stairs.  Keep rooms and walkways well-lit. Activity   Follow a regular exercise program to stay fit. This will help you maintain your balance. Ask your health care provider what types of exercise are appropriate for you.  If you need a cane or walker, use it as recommended by your health care provider.  Wear supportive shoes  that have nonskid soles. Lifestyle  Do not drink alcohol if your health care provider tells you not to drink.  If you drink alcohol, limit how much you have: ? 0-1 drink a day for women. ? 0-2 drinks a day for men.  Be aware of how much alcohol is in your drink. In the [place] one drink equals one typical bottle of beer (12 oz), one-half glass of wine (5 oz), or one shot of hard liquor (1 oz).  Do not use any products that contain nicotine or tobacco, such as cigarettes and e-cigarettes. If you need help quitting, ask your health care provider. Summary  Having a healthy lifestyle and getting preventive care can help to protect your health and wellness after age 26.  Screening and testing are the best way to find a health problem early and help you avoid having a fall. Early diagnosis and treatment give you the best chance for managing medical conditions that are more common for people who are older than age 36.  Falls are a major cause of broken bones and head injuries in people who are older than age 12. Take precautions to prevent a fall at home.  Work with your health care provider to learn what changes you can make to improve your health and wellness and to prevent falls. This information is not intended to replace advice given to you by your health care provider. Make sure you discuss any questions you have with your health care provider. Document Revised: 01/07/2019 Document Reviewed: 07/30/2017 Elsevier Patient Education  2020 [employer].

## 2019-11-23 NOTE — Progress Notes (Signed)
This visit was conducted in person.  BP 132/84 (BP Location: Left Arm, Patient Position: Sitting, Cuff Size: Normal)   Pulse 81   Temp 97.6 F (36.4 C) (Temporal)   Ht 5' 8.25" (1.734 m)   Wt 147 lb 7 oz (66.9 kg)   SpO2 96%   BMI 22.25 kg/m    CC: AMW f/u visit Subjective:    Patient ID: Harold Chavez, male    DOB: 25-Sep-1924, 86 y.o.   MRN: [id number]  HPI: Harold Chavez is a 86 y.o. male presenting on 11/23/2019 for Annual Exam (Prt 2.  Pt accompanied by son, [name]- temp 98.0.)   Saw health advisor earlier this month for medicare wellness visit. Note reviewed. No concerns identified.   No exam data present    Clinical Support from 11/11/2019 in [place] at [facility] Total Score  0      Fall Risk  11/11/2019 11/11/2018 11/03/2017 10/11/2016 10/16/2015  Falls in the past year? 0 0 No Yes No  Comment - - - pt had accidental fall in home -  Number falls in past yr: 0 - - 1 -  Injury with Fall? 0 - - No -  Risk for fall due to : Medication side effect - - - -  Follow up Falls evaluation completed;Falls prevention discussed - - - -    Known CLL followed by Dr Janese Banks [facility] Q12 months. S/p bendamustine and rituxan (2015). Did not receive 6th treatment due to PNA with sepsis and C diff hospitalization. CLL in remission. Also with sarcoidosis, IgG gammopathy of undetermined significance and B12 deficiency.   Preventative: Colon cancer screening - aged out Prostate cancer screening - aged out Lung cancer screening - aged out, not candidate Flu shot yearly  Tetanus shot - 2008 Pneumovax - 05/2014, prevnar2017 zostavax- not a candidate shingrix - discussed, will consider. Discussed need to wait 30 days after covid vaccine Pfizer covid vaccine 10/2019, 11/2019 Advanced directive discussion - has at home. Son [name] is HCPOA. Asked to bring me copy. Seat belt use discussed Sunscreen use discussed. No changing moles on skin. 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 Interim medical history since our last visit reviewed. Allergies and medications reviewed and updated. Outpatient Medications Prior to Visit  Medication Sig Dispense Refill  . Docusate Calcium (STOOL SOFTENER PO) Take by mouth as needed.    Marland Kitchen atenolol (TENORMIN) 25 MG tablet TAKE 1 TABLET BY MOUTH TWICE A DAY 180 tablet 1  . cyanocobalamin (V-R VITAMIN B-12) 500 MCG tablet Take 1 tablet (500 mcg total) by mouth daily. (Patient taking differently: Take 500 mcg by mouth every other day. )     No facility-administered medications prior to visit.     Per HPI unless specifically indicated in ROS section below Review of Systems Objective:    BP 132/84 (BP Location: Left Arm, Patient Position: Sitting, Cuff Size: Normal)   Pulse 81   Temp 97.6 F (36.4 C) (Temporal)   Ht 5' 8.25" (1.734 m)   Wt 147 lb 7 oz (66.9 kg)   SpO2 96%  BMI 22.25 kg/m   Wt Readings from Last 3 Encounters:  11/23/19 147 lb 7 oz (66.9 kg)  11/12/18 154 lb 4 oz (70 kg)  11/11/18 152 lb 9.6 oz (69.2 kg)    Physical Exam Vitals and nursing note reviewed.  Constitutional:      General: He is not in acute distress.    Appearance: Normal appearance. He is well-developed. He is not ill-appearing.  HENT:     Head: Normocephalic and atraumatic.     Right Ear: Hearing, tympanic membrane, ear canal and external ear normal. There is impacted cerumen.     Left Ear: Hearing, tympanic membrane, ear canal and external ear normal.     Mouth/Throat:     Pharynx: Uvula midline.  Eyes:     General: No scleral icterus.    Extraocular Movements: Extraocular movements intact.      Conjunctiva/sclera: Conjunctivae normal.     Pupils: Pupils are equal, round, and reactive to light.  Neck:     Vascular: No carotid bruit.  Cardiovascular:     Rate and Rhythm: Normal rate and regular rhythm.     Pulses: Normal pulses.          Radial pulses are 2+ on the right side and 2+ on the left side.     Heart sounds: Normal heart sounds. No murmur.  Pulmonary:     Effort: Pulmonary effort is normal. No respiratory distress.     Breath sounds: No wheezing, rhonchi or rales.     Comments: Coarse crackles bibasilarly Abdominal:     General: Abdomen is flat. Bowel sounds are normal. There is no distension.     Palpations: Abdomen is soft. There is no mass.     Tenderness: There is no abdominal tenderness. There is no guarding or rebound.     Hernia: No hernia is present.  Musculoskeletal:        General: Normal range of motion.     Cervical back: Normal range of motion and neck supple.     Right lower leg: No edema.     Left lower leg: No edema.     Comments: Chronic R lateral elbow mass   Lymphadenopathy:     Cervical: No cervical adenopathy.  Skin:    General: Skin is warm and dry.     Findings: No rash.  Neurological:     General: No focal deficit present.     Mental Status: He is alert and oriented to person, place, and time.     Comments: CN grossly intact, station and gait intact  Psychiatric:        Mood and Affect: Mood normal.        Behavior: Behavior normal.        Thought Content: Thought content normal.        Judgment: Judgment normal.       Results for orders placed or performed in visit on 11/11/19  Parathyroid hormone, intact (no Ca)  Result Value Ref Range   PTH 59 14 - 64 pg/mL  VITAMIN D 25 Hydroxy (Vit-D Deficiency, Fractures)  Result Value Ref Range   VITD 11.20 (L) 30.00 - 100.00 ng/mL  Microalbumin / creatinine urine ratio  Result Value Ref Range   Microalb, Ur 21.6 (H) 0.0 - 1.9 mg/dL   Creatinine,U 74.6 mg/dL   Microalb Creat Ratio  29.0 0.0 - 30.0 mg/g  Vitamin B12  Result Value Ref Range   Vitamin B-12 346 211 - 911 pg/mL  Comprehensive metabolic panel  Result Value Ref Range   Sodium 140 135 - 145 mEq/L   Potassium 4.9 3.5 - 5.1 mEq/L   Chloride 104 96 - 112 mEq/L   CO2 29 19 - 32 mEq/L   Glucose, Bld 93 70 - 99 mg/dL   BUN 33 (H) 6 - 23 mg/dL   Creatinine, Ser 1.59 (H) 0.40 - 1.50 mg/dL   Total Bilirubin 0.8 0.2 - 1.2 mg/dL   Alkaline Phosphatase 70 39 - 117 U/L   AST 15 0 - 37 U/L   ALT 8 0 - 53 U/L   Total Protein 7.5 6.0 - 8.3 g/dL   Albumin 4.0 3.5 - 5.2 g/dL   GFR 40.80 (L) >60.00 mL/min   Calcium 9.4 8.4 - 10.5 mg/dL   Lab Results  Component Value Date   WBC 7.6 08/31/2019   HGB 13.1 08/31/2019   HCT 40.2 08/31/2019   MCV 96.9 08/31/2019   PLT 225 08/31/2019    Assessment & Plan:  This visit occurred during the SARS-CoV-2 public health emergency.  Safety protocols were in place, including screening questions prior to the visit, additional usage of staff PPE, and extensive cleaning of exam room while observing appropriate contact time as indicated for disinfecting solutions.   Problem List Items Addressed This Visit    Sarcoidosis    Stable period. Consider updated CXR next visit.       Megaloblastic anemia due to B12 deficiency    B12 replaced. No longer anemic.       Relevant Medications   vitamin B-12 (V-R VITAMIN B-12) 500 MCG tablet   History of malignant melanoma    Sees derm Q6 mo      Essential hypertension    Chronic, stable continue current regimen.       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       CKD (chronic kidney disease) stage 3, GFR 30-59 ml/min    Chronic, stable. Reviewed with patient and son, encouraged good hydration status.       Chronic lymphocytic leukemia ([place])    Appreciate onc care Janese Banks)- stable period in remission.      Advanced care planning/counseling discussion -  Primary    Encouraged he bring me copy - has this at home. Son is HCPOA.           Meds ordered this encounter  Medications  . atenolol (TENORMIN) 25 MG tablet    Sig: Take 1 tablet (25 mg total) by mouth 2 (two) times daily.    Dispense:  180 tablet    Refill:  3  . vitamin B-12 (V-R VITAMIN B-12) 500 MCG tablet    Sig: Take 1 tablet (500 mcg total) by mouth daily.    Dispense:     . Cholecalciferol (VITAMIN D3) 25 MCG (1000 UT) CAPS    Sig: Take 1 capsule (1,000 Units total) by mouth daily.    Dispense:  30 capsule   No orders of the defined types were placed in this encounter.   Patient instructions: I'm glad you got the covid vaccine  If interested, check with pharmacy about new 2 shot shingles series (shingrix).  Bring me copy of advanced directive to update chart.  Increase vitamin b12 to 556mcg daily Start vitamin D 1000 units daily.  You are doing well today, return as needed or in 1 year for next wellness visit  Follow up plan: Return in about 1 year (around 11/22/2020) for medicare wellness visit.  Ria Bush, MD

## 2019-11-23 NOTE — Assessment & Plan Note (Addendum)
Chronic, stable. Reviewed with patient and son, encouraged good hydration status.

## 2019-11-23 NOTE — Assessment & Plan Note (Signed)
Appreciate onc care Harold Chavez)- stable period in remission.

## 2019-11-23 NOTE — Assessment & Plan Note (Signed)
Chronic, stable continue current regimen.  

## 2019-11-23 NOTE — Assessment & Plan Note (Signed)
B12 replaced. No longer anemic.

## 2019-11-23 NOTE — Assessment & Plan Note (Signed)
Encouraged he bring me copy - has this at home. Son is HCPOA.

## 2019-11-23 NOTE — Assessment & Plan Note (Signed)
Sees derm Q6 mo

## 2019-11-23 NOTE — Assessment & Plan Note (Signed)
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

## 2019-11-23 NOTE — Assessment & Plan Note (Signed)
Stable period. Consider updated CXR next visit.

## 2019-12-20 ENCOUNTER — Encounter: Payer: Self-pay | Admitting: Family Medicine

## 2019-12-20 ENCOUNTER — Ambulatory Visit (INDEPENDENT_AMBULATORY_CARE_PROVIDER_SITE_OTHER): Payer: Medicare Other | Admitting: Family Medicine

## 2019-12-20 ENCOUNTER — Other Ambulatory Visit: Payer: Self-pay

## 2019-12-20 VITALS — BP 132/76 | HR 74 | Temp 97.9°F | Ht 68.25 in | Wt 148.2 lb

## 2019-12-20 DIAGNOSIS — R350 Frequency of micturition: Secondary | ICD-10-CM | POA: Diagnosis not present

## 2019-12-20 DIAGNOSIS — R32 Unspecified urinary incontinence: Secondary | ICD-10-CM | POA: Insufficient documentation

## 2019-12-20 LAB — POC URINALSYSI DIPSTICK (AUTOMATED)
Bilirubin, UA: NEGATIVE
Glucose, UA: NEGATIVE
Ketones, UA: NEGATIVE
Nitrite, UA: NEGATIVE
Protein, UA: POSITIVE — AB
Spec Grav, UA: 1.02 (ref 1.010–1.025)
Urobilinogen, UA: 0.2 E.U./dL
pH, UA: 6 (ref 5.0–8.0)

## 2019-12-20 MED ORDER — CEPHALEXIN 500 MG PO CAPS: 500.0000 mg | ORAL_CAPSULE | Freq: Two times a day (BID) | ORAL | 0 refills | Status: DC

## 2019-12-20 NOTE — Assessment & Plan Note (Signed)
Urinary frequency with new onset urinary incontinence over last few days. UA/micro suspicious for UTI - will treat as such with keflex 500mg  7d course. Update if not improved with treatment. UCx sent.

## 2019-12-20 NOTE — Progress Notes (Addendum)
This visit was conducted in person.  BP 132/76 (BP Location: Right Arm, Patient Position: Sitting, Cuff Size: Normal)   Pulse 74   Temp 97.9 F (36.6 C) (Temporal)   Ht 5' 8.25" (1.734 m)   Wt 148 lb 3 oz (67.2 kg)   SpO2 96%   BMI 22.37 kg/m    CC: frequent urination Subjective:    Patient ID: Harold Chavez, male    DOB: [birth year], 86 y.o.   MRN: [id number]  HPI: Harold Chavez is a 86 y.o. male presenting on 12/20/2019 for Urinary Urgency (C/o having to urinate often.  Started a few wks ago, worsening in last couple of days.  Denies any other urinary sxs.  Pt accompanied by son, [name]- temp 97.9.)   Several week h/o increased urination without other urinary symptoms.  He has started drinking more fluids to ensure staying well hydrated.  Yesterday had 3 episodes of urinary incontinence. He did have urge to go but was unable to make it to the bathroom.  Notes weakening stream.  He notes unsteady walking when on uneven ground.   Denies dysuria, discomfort, hematuria, abd pain, fevers, nausea/vomiting, diarrhea or bowel changes. No lower back pain, leg pain or numbness or weakness or tingling of feet/legs.  He started using depends yesterday after 3rd accident.      Relevant past medical, surgical, family and social history reviewed and updated as indicated. Interim medical history since our last visit reviewed. Allergies and medications reviewed and updated. Outpatient Medications Prior to Visit  Medication Sig Dispense Refill  . atenolol (TENORMIN) 25 MG tablet Take 1 tablet (25 mg total) by mouth 2 (two) times daily. 180 tablet 3  . Cholecalciferol (VITAMIN D3) 25 MCG (1000 UT) CAPS Take 1 capsule (1,000 Units total) by mouth daily. 30 capsule   . Docusate Calcium (STOOL SOFTENER PO) Take by mouth as needed.    . vitamin B-12 (V-R VITAMIN B-12) 500 MCG tablet Take 1 tablet (500 mcg total) by mouth daily.     No facility-administered medications prior to visit.     Per  HPI unless specifically indicated in ROS section below Review of Systems Objective:    BP 132/76 (BP Location: Right Arm, Patient Position: Sitting, Cuff Size: Normal)   Pulse 74   Temp 97.9 F (36.6 C) (Temporal)   Ht 5' 8.25" (1.734 m)   Wt 148 lb 3 oz (67.2 kg)   SpO2 96%   BMI 22.37 kg/m   Wt Readings from Last 3 Encounters:  12/20/19 148 lb 3 oz (67.2 kg)  11/23/19 147 lb 7 oz (66.9 kg)  11/12/18 154 lb 4 oz (70 kg)    Physical Exam Vitals and nursing note reviewed.  Constitutional:      Appearance: Normal appearance. He is not ill-appearing.  Eyes:     Extraocular Movements: Extraocular movements intact.     Pupils: Pupils are equal, round, and reactive to light.  Cardiovascular:     Rate and Rhythm: Normal rate and regular rhythm.     Pulses: Normal pulses.     Heart sounds: Normal heart sounds. No murmur.  Pulmonary:     Effort: Pulmonary effort is normal. No respiratory distress.     Breath sounds: Normal breath sounds. No wheezing, rhonchi or rales.  Abdominal:     General: Abdomen is flat. Bowel sounds are normal. There is no distension.     Palpations: Abdomen is soft. There is no mass.  Tenderness: There is no abdominal tenderness. There is no right CVA tenderness, left CVA tenderness, guarding or rebound.     Hernia: No hernia is present.  Musculoskeletal:        General: Normal range of motion.     Comments:  No midline spine tenderness No paraspinous mm tenderness  Skin:    General: Skin is warm and dry.     Findings: No rash.  Neurological:     Mental Status: He is alert.       Results for orders placed or performed in visit on 12/20/19  POCT Urinalysis Dipstick (Automated)  Result Value Ref Range   Color, UA yellow    Clarity, UA cloudy    Glucose, UA Negative Negative   Bilirubin, UA negative    Ketones, UA negative    Spec Grav, UA 1.020 1.010 - 1.025   Blood, UA 3+    pH, UA 6.0 5.0 - 8.0   Protein, UA Positive (A) Negative    Urobilinogen, UA 0.2 0.2 or 1.0 E.U./dL   Nitrite, UA negative    Leukocytes, UA Large (3+) (A) Negative   Assessment & Plan:  This visit occurred during the SARS-CoV-2 public health emergency.  Safety protocols were in place, including screening questions prior to the visit, additional usage of staff PPE, and extensive cleaning of exam room while observing appropriate contact time as indicated for disinfecting solutions.   Problem List Items Addressed This Visit    Urinary frequency - Primary    Urinary frequency with new onset urinary incontinence over last few days. UA/micro suspicious for UTI - will treat as such with keflex 500mg  7d course. Update if not improved with treatment. UCx sent.       Relevant Orders   POCT Urinalysis Dipstick (Automated) (Completed)   Urine Culture       Meds ordered this encounter  Medications  . cephALEXin (KEFLEX) 500 MG capsule    Sig: Take 1 capsule (500 mg total) by mouth 2 (two) times daily.    Dispense:  14 capsule    Refill:  0   Orders Placed This Encounter  Procedures  . Urine Culture  . POCT Urinalysis Dipstick (Automated)   Patient instructions: Looks like you may have a UTI - treat with keflex twice daily for 7 days. I have sent this to your pharmacy. Let us know if not improving with treatment. I will look under microscope and if a lot of blood, we may have you return for repeat urine test in 2-3  weeks after treatment. We will be in touch with urine culture results.   Follow up plan: Return if symptoms worsen or fail to improve.  Ria Bush, MD

## 2019-12-20 NOTE — Patient Instructions (Signed)
Looks like you may have a UTI - treat with keflex twice daily for 7 days. I have sent this to your pharmacy. Let us know if not improving with treatment. I will look under microscope and if a lot of blood, we may have you return for repeat urine test in 2-3  weeks after treatment. We will be in touch with urine culture results.    Urinary Tract Infection, Adult  A urinary tract infection (UTI) is an infection of any part of the urinary tract. The urinary tract includes the kidneys, ureters, bladder, and urethra. These organs make, store, and get rid of urine in the body. Your health care provider may use other names to describe the infection. An upper UTI affects the ureters and kidneys (pyelonephritis). A lower UTI affects the bladder (cystitis) and urethra (urethritis). What are the causes? Most urinary tract infections are caused by bacteria in your genital area, around the entrance to your urinary tract (urethra). These bacteria grow and cause inflammation of your urinary tract. What increases the risk? You are more likely to develop this condition if:  You have a urinary catheter that stays in place (indwelling).  You are not able to control when you urinate or have a bowel movement (you have incontinence).  You are male and you: ? Use a spermicide or diaphragm for birth control. ? Have low estrogen levels. ? Are pregnant.  You have certain genes that increase your risk (genetics).  You are sexually active.  You take antibiotic medicines.  You have a condition that causes your flow of urine to slow down, such as: ? An enlarged prostate, if you are male. ? Blockage in your urethra (stricture). ? A kidney stone. ? A nerve condition that affects your bladder control (neurogenic bladder). ? Not getting enough to drink, or not urinating often.  You have certain medical conditions, such as: ? Diabetes. ? A weak disease-fighting system (immunesystem). ? Sickle cell  disease. ? Gout. ? Spinal cord injury. What are the signs or symptoms? Symptoms of this condition include:  Needing to urinate right away (urgently).  Frequent urination or passing small amounts of urine frequently.  Pain or burning with urination.  Blood in the urine.  Urine that smells bad or unusual.  Trouble urinating.  Cloudy urine.  Vaginal discharge, if you are male.  Pain in the abdomen or the lower back. You may also have:  Vomiting or a decreased appetite.  Confusion.  Irritability or tiredness.  A fever.  Diarrhea. The first symptom in older adults may be confusion. In some cases, they may not have any symptoms until the infection has worsened. How is this diagnosed? This condition is diagnosed based on your medical history and a physical exam. You may also have other tests, including:  Urine tests.  Blood tests.  Tests for sexually transmitted infections (STIs). If you have had more than one UTI, a cystoscopy or imaging studies may be done to determine the cause of the infections. How is this treated? Treatment for this condition includes:  Antibiotic medicine.  Over-the-counter medicines to treat discomfort.  Drinking enough water to stay hydrated. If you have frequent infections or have other conditions such as a kidney stone, you may need to see a health care provider who specializes in the urinary tract (urologist). In rare cases, urinary tract infections can cause sepsis. Sepsis is a life-threatening condition that occurs when the body responds to an infection. Sepsis is treated in the hospital with  IV antibiotics, fluids, and other medicines. Follow these instructions at home:  Medicines  Take over-the-counter and prescription medicines only as told by your health care provider.  If you were prescribed an antibiotic medicine, take it as told by your health care provider. Do not stop using the antibiotic even if you start to feel  better. General instructions  Make sure you: ? Empty your bladder often and completely. Do not hold urine for long periods of time. ? Empty your bladder after sex. ? Wipe from front to back after a bowel movement if you are male. Use each tissue one time when you wipe.  Drink enough fluid to keep your urine pale yellow.  Keep all follow-up visits as told by your health care provider. This is important. Contact a health care provider if:  Your symptoms do not get better after 1-2 days.  Your symptoms go away and then return. Get help right away if you have:  Severe pain in your back or your lower abdomen.  A fever.  Nausea or vomiting. Summary  A urinary tract infection (UTI) is an infection of any part of the urinary tract, which includes the kidneys, ureters, bladder, and urethra.  Most urinary tract infections are caused by bacteria in your genital area, around the entrance to your urinary tract (urethra).  Treatment for this condition often includes antibiotic medicines.  If you were prescribed an antibiotic medicine, take it as told by your health care provider. Do not stop using the antibiotic even if you start to feel better.  Keep all follow-up visits as told by your health care provider. This is important. This information is not intended to replace advice given to you by your health care provider. Make sure you discuss any questions you have with your health care provider. Document Revised: 09/03/2018 Document Reviewed: 03/26/2018 Elsevier Patient Education  2020 [employer].

## 2019-12-21 LAB — URINE CULTURE
MICRO NUMBER:: 10277049
SPECIMEN QUALITY:: ADEQUATE

## 2019-12-27 ENCOUNTER — Ambulatory Visit (INDEPENDENT_AMBULATORY_CARE_PROVIDER_SITE_OTHER): Payer: Medicare Other | Admitting: Family Medicine

## 2019-12-27 ENCOUNTER — Encounter: Payer: Self-pay | Admitting: Family Medicine

## 2019-12-27 ENCOUNTER — Other Ambulatory Visit: Payer: Self-pay

## 2019-12-27 VITALS — BP 120/70 | HR 85 | Temp 97.9°F | Ht 68.25 in | Wt 149.0 lb

## 2019-12-27 DIAGNOSIS — N3941 Urge incontinence: Secondary | ICD-10-CM

## 2019-12-27 DIAGNOSIS — N3944 Nocturnal enuresis: Secondary | ICD-10-CM

## 2019-12-27 LAB — POC URINALSYSI DIPSTICK (AUTOMATED)
Bilirubin, UA: NEGATIVE
Glucose, UA: NEGATIVE
Ketones, UA: NEGATIVE
Nitrite, UA: NEGATIVE
Protein, UA: POSITIVE — AB
Spec Grav, UA: 1.02 (ref 1.010–1.025)
Urobilinogen, UA: 0.2 E.U./dL
pH, UA: 6 (ref 5.0–8.0)

## 2019-12-27 MED ORDER — OXYBUTYNIN CHLORIDE 5 MG PO TABS: 5.0000 mg | ORAL_TABLET | Freq: Every evening | ORAL | 1 refills | Status: DC | PRN

## 2019-12-27 MED ORDER — SULFAMETHOXAZOLE-TRIMETHOPRIM 800-160 MG PO TABS: 1.0000 | ORAL_TABLET | Freq: Two times a day (BID) | ORAL | 0 refills | Status: DC

## 2019-12-27 NOTE — Assessment & Plan Note (Signed)
Worsening urge incontinence with episodes of nocturnal enuresis (nightly last few nights). No benefit with keflex course. Prior UCx with insignificant growth. UA again concerning for infection (3+ RBC, 3+ WBC) - will send UCx. ?prostatitis - will treat with bactrim DS 2 wk course. Will also start ditropan 5mg  at night time and refer to urology for further evaluation. If above treatment effective, may cancel urology referral.

## 2019-12-27 NOTE — Progress Notes (Addendum)
This visit was conducted in person.  BP 120/70 (BP Location: Left Arm, Patient Position: Sitting, Cuff Size: Normal)   Pulse 85   Temp 97.9 F (36.6 C) (Temporal)   Ht 5' 8.25" (1.734 m)   Wt 149 lb (67.6 kg)   SpO2 93%   BMI 22.49 kg/m    CC: nocturia Subjective:    Patient ID: Harold Chavez, male    DOB: 25-Jan-1924, 86 y.o.   MRN: [id number]  HPI: Harold Chavez is a 86 y.o. male presenting on 12/27/2019 for Nocturia (Per pt's son, [name], pt has urinated on himself ever night since 12/23/19.  Pt seen and tx, with abx, on 12/20/19 for urinary frequency.  Pt's accompanied by son, [name]- temp 98.2. )   See prior note for details.  Seen here last week with several weeks of increased urinary frequency and urgency associated with new urge incontinence.  From prior visit:  Denies dysuria, discomfort, hematuria, abd pain, fevers, nausea/vomiting, diarrhea or bowel changes. No lower back pain, leg pain or numbness or weakness or tingling of feet/legs. He started using depends yesterday after 3rd accident.   UA - 3+ blood (3-5 RBC/hpf on micro), 3+ LE.  UCx - insignificant growth.  Treated with keflex 7d course - without improvement.   Since 12/23/2019 starting to have more urinary accidents at night time - has wet bed every night since Thursday.  They note he is more unsteady on his feet.  No changes in mentation or increased confusion.  Noted weaker stream.  Main trouble at night as well as upon first standing after prolonged period of time seated.  No stress incontinence symptoms.   Still no dysuria, fevers, abd pain, lower back pain. No pain anywhere. No constipation. 1 BM every 1-2 days.  Appetite stays good. Avoids spicy foods Avoids caffeine. No alcohol.  Drinks good water intake.   Lab Results  Component Value Date   PSA 0.2 06/01/2003   PSA 0.34 12/30/1999   PSA 0.34 12/30/1999       Relevant past medical, surgical, family and social history reviewed and updated as  indicated. Interim medical history since our last visit reviewed. Allergies and medications reviewed and updated. Outpatient Medications Prior to Visit  Medication Sig Dispense Refill  . atenolol (TENORMIN) 25 MG tablet Take 1 tablet (25 mg total) by mouth 2 (two) times daily. 180 tablet 3  . Cholecalciferol (VITAMIN D3) 25 MCG (1000 UT) CAPS Take 1 capsule (1,000 Units total) by mouth daily. 30 capsule   . Docusate Calcium (STOOL SOFTENER PO) Take by mouth as needed.    . Probiotic Product (PROBIOTIC PO) Take by mouth daily.    . vitamin B-12 (V-R VITAMIN B-12) 500 MCG tablet Take 1 tablet (500 mcg total) by mouth daily.    . cephALEXin (KEFLEX) 500 MG capsule Take 1 capsule (500 mg total) by mouth 2 (two) times daily. 14 capsule 0   No facility-administered medications prior to visit.     Per HPI unless specifically indicated in ROS section below Review of Systems Objective:    BP 120/70 (BP Location: Left Arm, Patient Position: Sitting, Cuff Size: Normal)   Pulse 85   Temp 97.9 F (36.6 C) (Temporal)   Ht 5' 8.25" (1.734 m)   Wt 149 lb (67.6 kg)   SpO2 93%   BMI 22.49 kg/m   Wt Readings from Last 3 Encounters:  12/27/19 149 lb (67.6 kg)  12/20/19 148 lb 3 oz (67.2 kg)  11/23/19 147 lb 7 oz (66.9 kg)    Physical Exam Vitals and nursing note reviewed.  Constitutional:      Appearance: Normal appearance.  HENT:     Head: Normocephalic and atraumatic.     Mouth/Throat:     Mouth: Mucous membranes are dry.     Pharynx: Oropharynx is clear. No oropharyngeal exudate or posterior oropharyngeal erythema.  Eyes:     Extraocular Movements: Extraocular movements intact.     Pupils: Pupils are equal, round, and reactive to light.  Cardiovascular:     Rate and Rhythm: Normal rate and regular rhythm.     Pulses: Normal pulses.     Heart sounds: Normal heart sounds. No murmur.  Pulmonary:     Effort: Pulmonary effort is normal. No respiratory distress.     Breath sounds: Normal  breath sounds. No wheezing, rhonchi or rales.  Musculoskeletal:     Right lower leg: No edema.     Left lower leg: No edema.  Skin:    General: Skin is warm and dry.     Capillary Refill: Capillary refill takes less than 2 seconds.     Findings: No rash.  Neurological:     General: No focal deficit present.     Mental Status: He is alert. Mental status is at baseline.     Cranial Nerves: Cranial nerves are intact.     Sensory: Sensation is intact.     Motor: Motor function is intact.     Coordination: Romberg sign negative. Finger-Nose-Finger Test normal.     Gait: Gait abnormal.     Deep Tendon Reflexes:     Reflex Scores:      Patellar reflexes are 2+ on the right side and 2+ on the left side.      Achilles reflexes are 2+ on the right side and 2+ on the left side.    Comments:  CN 2-12 intact FTN intact EOMI No pronator drift Strength 5/5 BUE, BLE  Psychiatric:        Mood and Affect: Mood normal.        Behavior: Behavior normal.       Results for orders placed or performed in visit on 12/27/19  POCT Urinalysis Dipstick (Automated)  Result Value Ref Range   Color, UA yellow    Clarity, UA cloudy    Glucose, UA Negative Negative   Bilirubin, UA negative    Ketones, UA negative    Spec Grav, UA 1.020 1.010 - 1.025   Blood, UA 3+    pH, UA 6.0 5.0 - 8.0   Protein, UA Positive (A) Negative   Urobilinogen, UA 0.2 0.2 or 1.0 E.U./dL   Nitrite, UA negative    Leukocytes, UA Large (3+) (A) Negative   Lab Results  Component Value Date   CREATININE 1.59 (H) 11/11/2019   BUN 33 (H) 11/11/2019   NA 140 11/11/2019   K 4.9 11/11/2019   CL 104 11/11/2019   CO2 29 11/11/2019    Assessment & Plan:  This visit occurred during the SARS-CoV-2 public health emergency.  Safety protocols were in place, including screening questions prior to the visit, additional usage of staff PPE, and extensive cleaning of exam room while observing appropriate contact time as indicated for  disinfecting solutions.   Problem List Items Addressed This Visit    Urinary incontinence - Primary    Worsening urge incontinence with episodes of nocturnal enuresis (nightly last few nights). No benefit with keflex course.  Prior UCx with insignificant growth. UA again concerning for infection (3+ RBC, 3+ WBC) - will send UCx. ?prostatitis - will treat with bactrim DS 2 wk course. Will also start ditropan 5mg  at night time and refer to urology for further evaluation. If above treatment effective, may cancel urology referral.       Relevant Medications   oxybutynin (DITROPAN) 5 MG tablet   Other Relevant Orders   POCT Urinalysis Dipstick (Automated) (Completed)   Urine Culture   POCT Urinalysis Dipstick (Automated) (Completed)       Meds ordered this encounter  Medications  . oxybutynin (DITROPAN) 5 MG tablet    Sig: Take 1 tablet (5 mg total) by mouth at bedtime as needed for bladder spasms.    Dispense:  30 tablet    Refill:  1  . sulfamethoxazole-trimethoprim (BACTRIM DS) 800-160 MG tablet    Sig: Take 1 tablet by mouth 2 (two) times daily.    Dispense:  28 tablet    Refill:  0   Orders Placed This Encounter  Procedures  . Urine Culture  . POCT Urinalysis Dipstick (Automated)    Patient instructions: Back off caffeinated beverages.  Try oxybutynin medicine at night time to help night time urination. Watch for dry mouth, constipation, urinary retention, confusion, or worsening unsteadiness. If these side effects develop, stop medicine. If not improving with medicine, we will refer you to urologist.    Follow up plan: Return if symptoms worsen or fail to improve.  Ria Bush, MD

## 2019-12-27 NOTE — Patient Instructions (Addendum)
Back off caffeinated beverages.  Try oxybutynin medicine at night time to help night time urination. Watch for dry mouth, constipation, urinary retention, confusion, or worsening unsteadiness. If these side effects develop, stop medicine. If not improving with medicine, we will refer you to urologist.    Urinary Incontinence  Urinary incontinence refers to a condition in which a person is unable to control where and when to pass urine. A person with this condition will urinate when he or she does not mean to (involuntarily). What are the causes? This condition may be caused by:  Medicines.  Infections.  Constipation.  Overactive bladder muscles.  Weak bladder muscles.  Weak pelvic floor muscles. These muscles provide support for the bladder, intestine, and, in women, the uterus.  Enlarged prostate in men. The prostate is a gland near the bladder. When it gets too big, it can pinch the urethra. With the urethra blocked, the bladder can weaken and lose the ability to empty properly.  Surgery.  Emotional factors, such as anxiety, stress, or post-traumatic stress disorder (PTSD).  Pelvic organ prolapse. This happens in women when organs shift out of place and into the vagina. This shift can prevent the bladder and urethra from working properly. What increases the risk? The following factors may make you more likely to develop this condition:  Older age.  Obesity and physical inactivity.  Pregnancy and childbirth.  Menopause.  Diseases that affect the nerves or spinal cord (neurological diseases).  Long-term (chronic) coughing. This can increase pressure on the bladder and pelvic floor muscles. What are the signs or symptoms? Symptoms may vary depending on the type of urinary incontinence you have. They include:  A sudden urge to urinate, but passing urine involuntarily before you can get to a bathroom (urge incontinence).  Suddenly passing urine with any activity that  forces urine to pass, such as coughing, laughing, exercise, or sneezing (stress incontinence).  Needing to urinate often, but urinating only a small amount, or constantly dribbling urine (overflow incontinence).  Urinating because you cannot get to the bathroom in time due to a physical disability, such as arthritis or injury, or communication and thinking problems, such as Alzheimer disease (functional incontinence). How is this diagnosed? This condition may be diagnosed based on:  Your medical history.  A physical exam.  Tests, such as: ? Urine tests. ? X-rays of your kidney and bladder. ? Ultrasound. ? CT scan. ? Cystoscopy. In this procedure, a health care provider inserts a tube with a light and camera (cystoscope) through the urethra and into the bladder in order to check for problems. ? Urodynamic testing. These tests assess how well the bladder, urethra, and sphincter can store and release urine. There are different types of urodynamic tests, and they vary depending on what the test is measuring. To help diagnose your condition, your health care provider may recommend that you keep a log of when you urinate and how much you urinate. How is this treated? Treatment for this condition depends on the type of incontinence that you have and its cause. Treatment may include:  Lifestyle changes, such as: ? Quitting smoking. ? Maintaining a healthy weight. ? Staying active. Try to get 150 minutes of moderate-intensity exercise every week. Ask your health care provider which activities are safe for you. ? Eating a healthy diet.  Avoid high-fat foods, like fried foods.  Avoid refined carbohydrates like white bread and white rice.  Limit how much alcohol and caffeine you drink.  Increase your fiber  intake. Foods such as fresh fruits, vegetables, beans, and whole grains are healthy sources of fiber.  Pelvic floor muscle exercises.  Bladder training, such as lengthening the amount of  time between bathroom breaks, or using the bathroom at regular intervals.  Using techniques to suppress bladder urges. This can include distraction techniques or controlled breathing exercises.  Medicines to relax the bladder muscles and prevent bladder spasms.  Medicines to help slow or prevent the growth of a man's prostate.  Botox injections. These can help relax the bladder muscles.  Using pulses of electricity to help change bladder reflexes (electrical nerve stimulation).  For women, using a medical device to prevent urine leaks. This is a small, tampon-like, disposable device that is inserted into the urethra.  Injecting collagen or carbon beads (bulking agents) into the urinary sphincter. These can help thicken tissue and close the bladder opening.  Surgery. Follow these instructions at home: Lifestyle  Limit alcohol and caffeine. These can fill your bladder quickly and irritate it.  Keep yourself clean to help prevent odors and skin damage. Ask your doctor about special skin creams and cleansers that can protect the skin from urine.  Consider wearing pads or adult diapers. Make sure to change them regularly, and always change them right after experiencing incontinence. General instructions  Take over-the-counter and prescription medicines only as told by your health care provider.  Use the bathroom about every 3-4 hours, even if you do not feel the need to urinate. Try to empty your bladder completely every time. After urinating, wait a minute. Then try to urinate again.  Make sure you are in a relaxed position while urinating.  If your incontinence is caused by nerve problems, keep a log of the medicines you take and the times you go to the bathroom.  Keep all follow-up visits as told by your health care provider. This is important. Contact a health care provider if:  You have pain that gets worse.  Your incontinence gets worse. Get help right away if:  You have a  fever or chills.  You are unable to urinate.  You have redness in your groin area or down your legs. Summary  Urinary incontinence refers to a condition in which a person is unable to control where and when to pass urine.  This condition may be caused by medicines, infection, weak bladder muscles, weak pelvic floor muscles, enlargement of the prostate (in men), or surgery.  The following factors increase your risk for developing this condition: older age, obesity, pregnancy and childbirth, menopause, neurological diseases, and chronic coughing.  There are several types of urinary incontinence. They include urge incontinence, stress incontinence, overflow incontinence, and functional incontinence.  This condition is usually treated first with lifestyle and behavioral changes, such as quitting smoking, eating a healthier diet, and doing regular pelvic floor exercises. Other treatment options include medicines, bulking agents, medical devices, electrical nerve stimulation, or surgery. This information is not intended to replace advice given to you by your health care provider. Make sure you discuss any questions you have with your health care provider. Document Revised: 09/26/2017 Document Reviewed: 12/26/2016 Elsevier Patient Education  [place].

## 2019-12-29 LAB — URINE CULTURE
MICRO NUMBER:: 10302651
Result:: NO GROWTH
SPECIMEN QUALITY:: ADEQUATE

## 2020-01-03 ENCOUNTER — Telehealth: Payer: Self-pay | Admitting: Family Medicine

## 2020-01-03 DIAGNOSIS — N3941 Urge incontinence: Secondary | ICD-10-CM

## 2020-01-03 DIAGNOSIS — N3944 Nocturnal enuresis: Secondary | ICD-10-CM

## 2020-01-03 NOTE — Telephone Encounter (Signed)
[facility] urology referral placed. If they notice worsening unsteadiness, recommend we get head CT. Let me know.  Will forward to [place].

## 2020-01-03 NOTE — Telephone Encounter (Signed)
Spoke with pt relaying Dr. Synthia Innocent message.  Pt verbalizes understanding but I asked him to have son, [name] (on dpr) call me back.  Need to relay Dr. Synthia Innocent message to [place] also.

## 2020-01-03 NOTE — Telephone Encounter (Signed)
[name] called pt not any better needs referral to urology he would like to go to [facility] if possible

## 2020-01-04 ENCOUNTER — Telehealth: Payer: Self-pay | Admitting: Family Medicine

## 2020-01-04 DIAGNOSIS — N3941 Urge incontinence: Secondary | ICD-10-CM

## 2020-01-04 NOTE — Addendum Note (Signed)
Addended by: Ria Bush on: 01/04/2020 08:19 AM   Modules accepted: Orders

## 2020-01-04 NOTE — Telephone Encounter (Signed)
Called [facility] to try to move up Appt for patient. No sooner appointments at this time but have on cancellation list and will call to schedule if something sooner comes up.

## 2020-01-04 NOTE — Telephone Encounter (Signed)
Pt's son, [name] returning call.  I relayed Dr. Synthia Innocent message.  Verbalizes understanding.  States pt did seem unsteady on 01/01/20.  They stopped the oxybutynin and pt seems better.  However, they agree to head CT if Dr. Darnell Level thinks it's necessary.

## 2020-01-04 NOTE — Telephone Encounter (Signed)
Spoke with [facility] relaying Dr. Synthia Innocent message.  Verbalizes understanding.

## 2020-01-04 NOTE — Telephone Encounter (Addendum)
Stay off oxybutynin.  Let's watch unsteadiness, hold CT for now, but notify me if worsening unsteadiness.

## 2020-01-04 NOTE — Telephone Encounter (Signed)
Thank you :)

## 2020-01-07 ENCOUNTER — Telehealth: Payer: Self-pay | Admitting: Family Medicine

## 2020-01-07 DIAGNOSIS — R2681 Unsteadiness on feet: Secondary | ICD-10-CM

## 2020-01-07 DIAGNOSIS — N3941 Urge incontinence: Secondary | ICD-10-CM

## 2020-01-07 NOTE — Telephone Encounter (Signed)
Ct scheduled for [facility] on 01/12/20 at 4:15pm. Son notified.

## 2020-01-07 NOTE — Telephone Encounter (Signed)
Patient's son [name] called today to give you an update on the patient He stated they have stopped the medication like advised and it has been a week since doing so. The patient is not improving. He is still having trouble urinating and still unstable when walking. They would like to know what they should do from here .

## 2020-01-07 NOTE — Telephone Encounter (Signed)
Let's proceed with head CT.  Ordered.

## 2020-01-12 ENCOUNTER — Ambulatory Visit
Admission: RE | Admit: 2020-01-12 | Discharge: 2020-01-12 | Disposition: A | Discharge status: Home / Self Care | Payer: Medicare Other | Source: Ambulatory Visit | Attending: Family Medicine | Admitting: Family Medicine

## 2020-01-12 ENCOUNTER — Other Ambulatory Visit: Payer: Self-pay

## 2020-01-12 DIAGNOSIS — N3941 Urge incontinence: Secondary | ICD-10-CM | POA: Diagnosis not present

## 2020-01-12 DIAGNOSIS — R270 Ataxia, unspecified: Secondary | ICD-10-CM | POA: Diagnosis not present

## 2020-01-12 DIAGNOSIS — R2681 Unsteadiness on feet: Secondary | ICD-10-CM

## 2020-01-18 ENCOUNTER — Other Ambulatory Visit: Payer: Self-pay | Admitting: Family Medicine

## 2020-02-02 ENCOUNTER — Encounter: Payer: Self-pay | Admitting: Urology

## 2020-02-02 ENCOUNTER — Ambulatory Visit (INDEPENDENT_AMBULATORY_CARE_PROVIDER_SITE_OTHER): Payer: Medicare Other | Admitting: Urology

## 2020-02-02 ENCOUNTER — Other Ambulatory Visit: Payer: Self-pay

## 2020-02-02 VITALS — BP 130/77 | HR 71 | Ht 68.0 in | Wt 148.0 lb

## 2020-02-02 DIAGNOSIS — N3941 Urge incontinence: Secondary | ICD-10-CM | POA: Diagnosis not present

## 2020-02-02 DIAGNOSIS — R351 Nocturia: Secondary | ICD-10-CM

## 2020-02-02 DIAGNOSIS — R8281 Pyuria: Secondary | ICD-10-CM | POA: Diagnosis not present

## 2020-02-02 LAB — BLADDER SCAN AMB NON-IMAGING: Scan Result: 43

## 2020-02-02 NOTE — Progress Notes (Signed)
02/02/20 8:07 AM   Harold Chavez 24-Feb-1924 [id number]  Referring provider: Ria Bush, MD [address] [place],  [place] [address] Chief Complaint  Patient presents with  . Urinary Incontinence   HPI: Harold Chavez is a 86 y.o. white male who presents today for the evaluation and management for lower urinary tract symptoms.  -Onset urinary urgency with urge incontinence 1 month ago -Subsequently developed nocturia x4-6 with nighttime wetting -Saw Dr. Danise Mina and UAs x2 with leukocytes on dipstick however urine cultures with insignificant growth -Treated with 2-week course Septra DS without improvement -Trial Oxybutin in 12/2019 due to bedwetting but discontinued secondary to side effects -No dysuria or gross hematuria - Denies prior urology problems -IPSS 19/35  PMH: Past Medical History:  Diagnosis Date  . B12 deficiency anemia   . BCC (basal cell carcinoma of skin) 2017   L frontal scalp  . Benign paroxysmal positional vertigo   . C. difficile diarrhea 07/14/2014   2015   . Chronic kidney disease   . History of pneumonia 07/14/2014   2015   . History of shingles 2010  . IgG gammopathy   . 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 Leukemia ([place])    CLL  . 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 Sarcoidosis   . Sensorineural hearing loss (SNHL) of both ears 10/2015   haering aide eval rec through [facility])  . Squamous cell carcinoma in situ of skin 2017   R forearm, nose, R cheek - [place]    Surgical History: Past Surgical History:  Procedure Laterality Date  . MOHS SURGERY  2015   R cheek lentigo maligna in situ  . SKIN CANCER EXCISION  07/2018  . TESTICLE REMOVAL  1937   right; undescended    Home Medications:  Allergies as of 02/02/2020   No Known Allergies     Medication List       Accurate as of Feb 02, 2020 11:59 PM. If you have any questions, ask your nurse or doctor.          STOP taking these medications   sulfamethoxazole-trimethoprim 800-160 MG tablet Commonly known as: BACTRIM DS Stopped by: Abbie Sons, MD     TAKE these medications   atenolol 25 MG tablet Commonly known as: TENORMIN Take 1 tablet (25 mg total) by mouth 2 (two) times daily.   PROBIOTIC PO Take by mouth daily.   STOOL SOFTENER PO Take by mouth as needed.   vitamin B-12 500 MCG tablet Commonly known as: V-R VITAMIN B-12 Take 1 tablet (500 mcg total) by mouth daily.   Vitamin D3 25 MCG (1000 UT) Caps Take 1 capsule (1,000 Units total) by mouth daily.       Allergies: No Known Allergies  Family History: Family History  Problem Relation Age of Onset  . Cancer Neg Hx   . Stroke Neg Hx   . CAD Neg Hx   . Diabetes Neg Hx     Social History:  reports that he has never smoked. He has never used smokeless tobacco. He reports that he does not drink alcohol or use drugs.   Physical Exam: BP 130/77   Pulse 71   Ht 5\' 8"  (1.727 m)   Wt 148 lb (67.1 kg)   BMI 22.50 kg/m   Constitutional:  Alert and oriented, No acute distress. HEENT: [place] AT, moist mucus membranes.  Trachea midline, no masses. Cardiovascular: No clubbing, cyanosis, or edema. Respiratory: Normal respiratory effort, no increased work of breathing. Rectal: Prostate 30 g, smooth without nodules Skin: No rashes, bruises or suspicious lesions. Neurologic: Grossly intact, no focal deficits, moving all 4 extremities. Psychiatric: Normal mood and affect.  Laboratory Data:  Lab Results  Component Value Date   CREATININE 1.59 (H) 11/11/2019   Urinalysis -UA on 02/02/2020 showed greater than 30 white blood cells, 3-10 red blood cells  Pertinent Imaging: -Bladder scan residual on 02/02/2020 showed 43 CC.  Assessment & Plan:    - Lower urinary tract symptoms Relative acute onset of storage elated voiding symptoms and nocturia.  No significant PVR.  Urinalysis with micro today showed greater than 30 WBCs  and 3-10 RBCs.  Urine culture was repeated.  Given 25 mg of Myrbetriq samples, will follow up in 1 month to evaluate efficacy.  If urine culture negative would recommend cystoscopy   [place] [address] [place], [place] [address] [phone]  I, Joneen Boers Peace, am acting as a Education administrator for Dr. Nicki Reaper C. Kim Lauver.  I have reviewed the above documentation for accuracy and completeness, and I agree with the above.    Abbie Sons, MD

## 2020-02-03 ENCOUNTER — Encounter: Payer: Self-pay | Admitting: Urology

## 2020-02-03 LAB — URINALYSIS, COMPLETE
Bilirubin, UA: NEGATIVE
Glucose, UA: NEGATIVE
Ketones, UA: NEGATIVE
Nitrite, UA: NEGATIVE
Specific Gravity, UA: 1.02 (ref 1.005–1.030)
Urobilinogen, Ur: 0.2 mg/dL (ref 0.2–1.0)
pH, UA: 6 (ref 5.0–7.5)

## 2020-02-03 LAB — MICROSCOPIC EXAMINATION: WBC, UA: >30 /hpf — AB (ref 0–5)

## 2020-02-04 LAB — CULTURE, URINE COMPREHENSIVE

## 2020-02-07 ENCOUNTER — Telehealth: Payer: Self-pay | Admitting: *Deleted

## 2020-02-07 NOTE — Telephone Encounter (Signed)
Notified patient as instructed, patient pleased °

## 2020-02-07 NOTE — Telephone Encounter (Signed)
-----   Message from Abbie Sons, MD sent at 02/06/2020 11:31 AM EDT ----- Urine culture was negative.  Recommend scheduling cystoscopy

## 2020-03-09 ENCOUNTER — Encounter: Payer: Self-pay | Admitting: Urology

## 2020-03-09 ENCOUNTER — Other Ambulatory Visit: Payer: Self-pay | Admitting: Urology

## 2020-03-09 ENCOUNTER — Other Ambulatory Visit: Payer: Self-pay

## 2020-03-09 ENCOUNTER — Ambulatory Visit (INDEPENDENT_AMBULATORY_CARE_PROVIDER_SITE_OTHER): Payer: Medicare Other | Admitting: Urology

## 2020-03-09 VITALS — BP 108/74 | HR 67 | Ht 68.0 in | Wt 145.0 lb

## 2020-03-09 DIAGNOSIS — N3941 Urge incontinence: Secondary | ICD-10-CM

## 2020-03-09 DIAGNOSIS — R8281 Pyuria: Secondary | ICD-10-CM

## 2020-03-09 LAB — MICROSCOPIC EXAMINATION: WBC, UA: >30 /hpf — AB (ref 0–5)

## 2020-03-09 LAB — URINALYSIS, COMPLETE
Bilirubin, UA: NEGATIVE
Glucose, UA: NEGATIVE
Ketones, UA: NEGATIVE
Nitrite, UA: NEGATIVE
Specific Gravity, UA: 1.02 (ref 1.005–1.030)
Urobilinogen, Ur: 0.2 mg/dL (ref 0.2–1.0)
pH, UA: 6 (ref 5.0–7.5)

## 2020-03-09 NOTE — Progress Notes (Signed)
   03/09/20  CC:  Chief Complaint  Patient presents with  . Cysto   Indications: Sterile pyuria and urinary incontinence; see note 02/02/2020  HPI: No improvement in symptoms on Myrbetriq 25 mg  Blood pressure 108/74, pulse 67, height 5\' 8"  (1.727 m), weight 145 lb (65.8 kg). NED. A&Ox3.   No respiratory distress   Abd soft, NT, ND Normal phallus with bilateral descended testicles  Cystoscopy Procedure Note  Patient identification was confirmed, informed consent was obtained, and patient was prepped using Betadine solution.  Lidocaine jelly was administered per urethral meatus.     Pre-Procedure: - Inspection reveals a normal caliber urethral meatus.  Procedure: The flexible cystoscope was introduced without difficulty - No urethral strictures/lesions are present. - Moderate lateral lobe enlargement distal prostatic urethra; open bladder neck  - Normal bladder neck - Suboptimal visualization due to cloudy urine; 100 cc aspirated which was PVR - Bilateral ureteral orifices identified - Bladder mucosa  reveals no ulcers, tumors, or lesions - No bladder stones - No trabeculation  Retroflexion shows no intravesical median lobe   Post-Procedure: - Patient tolerated the procedure well  Assessment/ Plan: -No mucosal abnormality seen on cystoscopy -Chronic pyuria, asymptomatic; PVR not elevated -He tolerated Myrbetriq 25 mg well and was given 50 mg samples -Urine cytology sent   Abbie Sons, MD

## 2020-03-10 LAB — CYTOLOGY - NON PAP

## 2020-03-13 ENCOUNTER — Telehealth: Payer: Self-pay | Admitting: *Deleted

## 2020-03-13 LAB — PATHOLOGY

## 2020-03-13 NOTE — Telephone Encounter (Signed)
Notified patient as instructed, patient pleased. Discussed follow-up appointments, patient agrees  

## 2020-03-13 NOTE — Telephone Encounter (Signed)
-----   Message from Abbie Sons, MD sent at 03/12/2020 11:40 AM EDT ----- Urine cytology showed no findings suspicious for cancer.  Atypical cells were present most likely secondary to inflammation

## 2020-03-17 ENCOUNTER — Telehealth: Payer: Medicare Other | Admitting: Family Medicine

## 2020-03-21 ENCOUNTER — Encounter: Payer: Self-pay | Admitting: Family Medicine

## 2020-03-21 ENCOUNTER — Other Ambulatory Visit: Payer: Self-pay

## 2020-03-21 ENCOUNTER — Ambulatory Visit (INDEPENDENT_AMBULATORY_CARE_PROVIDER_SITE_OTHER)
Admission: RE | Admit: 2020-03-21 | Discharge: 2020-03-21 | Disposition: A | Discharge status: Home / Self Care | Payer: Medicare Other | Source: Ambulatory Visit | Attending: Family Medicine | Admitting: Family Medicine

## 2020-03-21 ENCOUNTER — Ambulatory Visit (INDEPENDENT_AMBULATORY_CARE_PROVIDER_SITE_OTHER): Payer: Medicare Other | Admitting: Family Medicine

## 2020-03-21 VITALS — BP 120/70 | HR 74 | Temp 97.5°F | Ht 68.0 in | Wt 142.2 lb

## 2020-03-21 DIAGNOSIS — R5381 Other malaise: Secondary | ICD-10-CM | POA: Insufficient documentation

## 2020-03-21 DIAGNOSIS — E559 Vitamin D deficiency, unspecified: Secondary | ICD-10-CM | POA: Insufficient documentation

## 2020-03-21 DIAGNOSIS — B001 Herpesviral vesicular dermatitis: Secondary | ICD-10-CM | POA: Insufficient documentation

## 2020-03-21 DIAGNOSIS — D869 Sarcoidosis, unspecified: Secondary | ICD-10-CM

## 2020-03-21 DIAGNOSIS — N3941 Urge incontinence: Secondary | ICD-10-CM

## 2020-03-21 DIAGNOSIS — R5383 Other fatigue: Secondary | ICD-10-CM

## 2020-03-21 DIAGNOSIS — N1832 Chronic kidney disease, stage 3b: Secondary | ICD-10-CM

## 2020-03-21 DIAGNOSIS — D531 Other megaloblastic anemias, not elsewhere classified: Secondary | ICD-10-CM | POA: Diagnosis not present

## 2020-03-21 DIAGNOSIS — D472 Monoclonal gammopathy: Secondary | ICD-10-CM

## 2020-03-21 DIAGNOSIS — J3489 Other specified disorders of nose and nasal sinuses: Secondary | ICD-10-CM

## 2020-03-21 DIAGNOSIS — C9110 Chronic lymphocytic leukemia of B-cell type not having achieved remission: Secondary | ICD-10-CM

## 2020-03-21 LAB — CBC WITH DIFFERENTIAL/PLATELET
Basophils Absolute: 0 10*3/uL (ref 0.0–0.1)
Basophils Relative: 0.5 % (ref 0.0–3.0)
Eosinophils Absolute: 0.1 10*3/uL (ref 0.0–0.7)
Eosinophils Relative: 0.7 % (ref 0.0–5.0)
HCT: 34.3 % — ABNORMAL LOW (ref 39.0–52.0)
Hemoglobin: 11.6 g/dL — ABNORMAL LOW (ref 13.0–17.0)
Lymphocytes Relative: 20.2 % (ref 12.0–46.0)
Lymphs Abs: 1.5 10*3/uL (ref 0.7–4.0)
MCHC: 33.8 g/dL (ref 30.0–36.0)
MCV: 96.8 fl (ref 78.0–100.0)
Monocytes Absolute: 0.2 10*3/uL (ref 0.1–1.0)
Monocytes Relative: 2.7 % — ABNORMAL LOW (ref 3.0–12.0)
Neutro Abs: 5.5 10*3/uL (ref 1.4–7.7)
Neutrophils Relative %: 75.9 % (ref 43.0–77.0)
Platelets: 306 10*3/uL (ref 150.0–400.0)
RBC: 3.54 Mil/uL — ABNORMAL LOW (ref 4.22–5.81)
RDW: 15.7 % — ABNORMAL HIGH (ref 11.5–15.5)
WBC: 7.3 10*3/uL (ref 4.0–10.5)

## 2020-03-21 LAB — COMPREHENSIVE METABOLIC PANEL
ALT: 18 U/L (ref 0–53)
AST: 20 U/L (ref 0–37)
Albumin: 3.6 g/dL (ref 3.5–5.2)
Alkaline Phosphatase: 86 U/L (ref 39–117)
BUN: 45 mg/dL — ABNORMAL HIGH (ref 6–23)
CO2: 28 mEq/L (ref 19–32)
Calcium: 9.1 mg/dL (ref 8.4–10.5)
Chloride: 101 mEq/L (ref 96–112)
Creatinine, Ser: 2.03 mg/dL — ABNORMAL HIGH (ref 0.40–1.50)
GFR: 30.75 mL/min — ABNORMAL LOW (ref >60.00)
Glucose, Bld: 113 mg/dL — ABNORMAL HIGH (ref 70–99)
Potassium: 4.7 mEq/L (ref 3.5–5.1)
Sodium: 135 mEq/L (ref 135–145)
Total Bilirubin: 0.7 mg/dL (ref 0.2–1.2)
Total Protein: 7.9 g/dL (ref 6.0–8.3)

## 2020-03-21 LAB — TSH: TSH: 2.35 u[IU]/mL (ref 0.35–4.50)

## 2020-03-21 LAB — VITAMIN B12: Vitamin B-12: 756 pg/mL (ref 211–911)

## 2020-03-21 LAB — VITAMIN D 25 HYDROXY (VIT D DEFICIENCY, FRACTURES): VITD: 31.53 ng/mL (ref 30.00–100.00)

## 2020-03-21 MED ORDER — MUPIROCIN 2 % EX OINT: 1.0000 "application " | TOPICAL_OINTMENT | Freq: Two times a day (BID) | CUTANEOUS | 0 refills | Status: AC

## 2020-03-21 NOTE — Patient Instructions (Addendum)
Labs today Increase water during the day.  Use antibiotic ointment sent to pharmacy to nose 2-3 times daily for the next week.  We will be in touch with results.

## 2020-03-21 NOTE — Assessment & Plan Note (Signed)
This is improving on its own.

## 2020-03-21 NOTE — Assessment & Plan Note (Signed)
Rx mupirocin to cover possible MRSA

## 2020-03-21 NOTE — Addendum Note (Signed)
Addended by: Ria Bush on: 03/21/2020 09:46 AM   Modules accepted: Orders

## 2020-03-21 NOTE — Assessment & Plan Note (Signed)
Ongoing so far reassuring urological eval. Medications haven't helped to date (oxybutynin, myrbetriq). Told prostate actually small.

## 2020-03-21 NOTE — Assessment & Plan Note (Signed)
Update b12 on 584mcg replacement.

## 2020-03-21 NOTE — Progress Notes (Addendum)
This visit was conducted in person.  BP 120/70 (BP Location: Right Arm, Patient Position: Sitting, Cuff Size: Normal)   Pulse 74   Temp (!) 97.5 F (36.4 C) (Temporal)   Ht 5\' 8"  (1.727 m)   Wt 142 lb 3 oz (64.5 kg)   SpO2 99%   BMI 21.62 kg/m    CC: fatigue Subjective:    Patient ID: Harold Chavez, male    DOB: 1924/01/02, 86 y.o.   MRN: [id number]  HPI: Harold Chavez is a 86 y.o. male presenting on 03/21/2020 for Fatigue (C/o fatigue, loss of energy, sleeping late and sleeping during the day.  Also, c/o walking a little slower.  Pt accompanied by son, Harold Chavez- temp 98.0.)   Ongoing fatigue, daytime somnolence, slowed gait associated with increasing unsteadiness for the past 3 months, again over the past few weeks and acutely worse this week. Ongoing urinary urgency worse at night affecting ability to get a good night's rest. Daytime somnolence. No changes in mentation. Notes cold intolerance. Last week had some cold sores and possible sore in R nostril.   No fevers/chills, night sweats, weight loss, or swollen glands. No dyspnea or chest pain, abd pain, cough. No changes in mentation. No tremors or stiffness. No paresthesias.   Recent labs 11/2019 reviewed - marked vit D deficiency, low normal B12, stable CKD stage 3.   CT head reassuringly ok (12/2019) - with atrophy and chronic small vessel ischemic changes.   Relatively new onset LUTS s/p reassuring urological evaluation.  Cystoscopy 02/2020 reassuring [facility]). Told small size prostate.  Myrbetriq 50mg  may have worsened imbalance and may not have helped bladder symptoms so they stopped it.  Previously did not tolerate oxybutynin.      Relevant past medical, surgical, family and social history reviewed and updated as indicated. Interim medical history since our last visit reviewed. Allergies and medications reviewed and updated. Outpatient Medications Prior to Visit  Medication Sig Dispense Refill  . atenolol (TENORMIN) 25  MG tablet Take 1 tablet (25 mg total) by mouth 2 (two) times daily. 180 tablet 3  . Cholecalciferol (VITAMIN D3) 25 MCG (1000 UT) CAPS Take 1 capsule (1,000 Units total) by mouth daily. 30 capsule   . Docusate Calcium (STOOL SOFTENER PO) Take by mouth as needed.    . Probiotic Product (PROBIOTIC PO) Take by mouth daily.    . vitamin B-12 (V-R VITAMIN B-12) 500 MCG tablet Take 1 tablet (500 mcg total) by mouth daily.     No facility-administered medications prior to visit.     Per HPI unless specifically indicated in ROS section below Review of Systems Objective:  BP 120/70 (BP Location: Right Arm, Patient Position: Sitting, Cuff Size: Normal)   Pulse 74   Temp (!) 97.5 F (36.4 C) (Temporal)   Ht 5\' 8"  (1.727 m)   Wt 142 lb 3 oz (64.5 kg)   SpO2 99%   BMI 21.62 kg/m   Wt Readings from Last 3 Encounters:  03/21/20 142 lb 3 oz (64.5 kg)  03/09/20 145 lb (65.8 kg)  02/02/20 148 lb (67.1 kg)      Physical Exam Vitals and nursing note reviewed.  Constitutional:      Appearance: Normal appearance. He is not ill-appearing.     Comments: Uses cane  HENT:     Nose:     Comments: Sore to R nostril    Mouth/Throat:     Mouth: Mucous membranes are dry.     Tongue:  No lesions.     Palate: No mass.     Pharynx: No posterior oropharyngeal erythema.      Comments: Dry MM with thick white post nasal drainage Sore to R upper lip as well as L skin below nose superior to lip  Eyes:     Extraocular Movements: Extraocular movements intact.     Pupils: Pupils are equal, round, and reactive to light.  Cardiovascular:     Rate and Rhythm: Normal rate and regular rhythm.     Pulses: Normal pulses.     Heart sounds: Normal heart sounds. No murmur heard.   Pulmonary:     Effort: Pulmonary effort is normal. No respiratory distress.     Breath sounds: Normal breath sounds. No wheezing, rhonchi or rales.  Musculoskeletal:     Right lower leg: No edema.     Left lower leg: No edema.  Skin:     General: Skin is warm and dry.     Findings: No rash.  Neurological:     General: No focal deficit present.     Mental Status: He is alert.     Motor: Motor function is intact.     Coordination: Romberg sign negative.     Comments: Walks with cane. CN grossly intact. Neg romberg.   Psychiatric:        Mood and Affect: Mood normal.        Behavior: Behavior normal.       Lab Results  Component Value Date   TSH 2.46 10/13/2015    Assessment & Plan:  This visit occurred during the SARS-CoV-2 public health emergency.  Safety protocols were in place, including screening questions prior to the visit, additional usage of staff PPE, and extensive cleaning of exam room while observing appropriate contact time as indicated for disinfecting solutions.   Problem List Items Addressed This Visit    Vitamin D deficiency    Update vit D on 1000 IU daily replacement.       Relevant Orders   VITAMIN D 25 Hydroxy (Vit-D Deficiency, Fractures)   Urinary incontinence    Ongoing so far reassuring urological eval. Medications haven't helped to date (oxybutynin, myrbetriq). Told prostate actually small.       Sarcoidosis    Update CXR.       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       Relevant Orders   Vitamin B12   Malaise and fatigue - Primary    Ongoing for months, acutely worse this weekend. No localizing symptoms. Start eval with blood work for reversible causes of fatigue, check CXR today (in h/o sarcoid per chart).       Relevant Orders   Vitamin B12   VITAMIN D 25 Hydroxy (Vit-D Deficiency, Fractures)   TSH   CBC with Differential/Platelet   Comprehensive metabolic panel   DG Chest 2 View   IgG gammopathy   Fever blister    This is improving on its own.       Relevant Medications   mupirocin ointment (BACTROBAN) 2 %   CKD (chronic  kidney disease) stage 3, GFR 30-59 ml/min   Chronic lymphocytic leukemia ([place])       Meds ordered this encounter  Medications  . mupirocin ointment (BACTROBAN) 2 %  Sig: Place 1 application into the nose 2 (two) times daily.    Dispense:  22 g    Refill:  0    Formulate ointment vs cream based on pt affordability/insurance formulary   Orders Placed This Encounter  Procedures  . DG Chest 2 View    Standing Status:   Future    Number of Occurrences:   1    Standing Expiration Date:   03/21/2021    Order Specific Question:   Reason for Exam (SYMPTOM  OR DIAGNOSIS REQUIRED)    Answer:   sarcoid f/u, weight loss, fatigue    Order Specific Question:   Preferred imaging location?    Answer:   [facility]    Order Specific Question:   Radiology Contrast Protocol - do NOT remove file path    Answer:   [email or web address]  . Vitamin B12  . VITAMIN D 25 Hydroxy (Vit-D Deficiency, Fractures)  . TSH  . CBC with Differential/Platelet  . Comprehensive metabolic panel    Patient Instructions  Labs today Increase water during the day.  Use antibiotic ointment sent to pharmacy to nose 2-3 times daily for the next week.  We will be in touch with results.   Follow up plan: Return if symptoms worsen or fail to improve.  Ria Bush, MD

## 2020-03-21 NOTE — Assessment & Plan Note (Signed)
Update vit D on 1000 IU daily replacement.

## 2020-03-21 NOTE — Assessment & Plan Note (Addendum)
Ongoing for months, acutely worse this weekend. No localizing symptoms. Start eval with blood work for reversible causes of fatigue, check CXR today (in h/o sarcoid per chart).

## 2020-03-21 NOTE — Assessment & Plan Note (Signed)
Update CXR.

## 2020-03-22 ENCOUNTER — Emergency Department
Admission: EM | Admit: 2020-03-22 | Discharge: 2020-03-30 | Disposition: E | Payer: Medicare Other | Attending: Emergency Medicine | Admitting: Emergency Medicine

## 2020-03-22 ENCOUNTER — Emergency Department: Payer: Medicare Other

## 2020-03-22 ENCOUNTER — Telehealth: Payer: Self-pay

## 2020-03-22 DIAGNOSIS — Z20822 Contact with and (suspected) exposure to covid-19: Secondary | ICD-10-CM | POA: Insufficient documentation

## 2020-03-22 DIAGNOSIS — N183 Chronic kidney disease, stage 3 unspecified: Secondary | ICD-10-CM | POA: Diagnosis not present

## 2020-03-22 DIAGNOSIS — Z79899 Other long term (current) drug therapy: Secondary | ICD-10-CM | POA: Insufficient documentation

## 2020-03-22 DIAGNOSIS — Z85828 Personal history of other malignant neoplasm of skin: Secondary | ICD-10-CM | POA: Diagnosis not present

## 2020-03-22 DIAGNOSIS — I469 Cardiac arrest, cause unspecified: Secondary | ICD-10-CM

## 2020-03-22 DIAGNOSIS — Z856 Personal history of leukemia: Secondary | ICD-10-CM | POA: Diagnosis not present

## 2020-03-22 LAB — COMPREHENSIVE METABOLIC PANEL
ALT: 18 U/L (ref 0–44)
AST: 25 U/L (ref 15–41)
Albumin: 3.2 g/dL — ABNORMAL LOW (ref 3.5–5.0)
Alkaline Phosphatase: 81 U/L (ref 38–126)
Anion gap: 10 (ref 5–15)
BUN: 46 mg/dL — ABNORMAL HIGH (ref 8–23)
CO2: 23 mmol/L (ref 22–32)
Calcium: 8.7 mg/dL — ABNORMAL LOW (ref 8.9–10.3)
Chloride: 101 mmol/L (ref 98–111)
Creatinine, Ser: 2.19 mg/dL — ABNORMAL HIGH (ref 0.61–1.24)
GFR calc Af Amer: 29 mL/min — ABNORMAL LOW (ref >60)
GFR calc non Af Amer: 25 mL/min — ABNORMAL LOW (ref >60)
Glucose, Bld: 118 mg/dL — ABNORMAL HIGH (ref 70–99)
Potassium: 4.5 mmol/L (ref 3.5–5.1)
Sodium: 134 mmol/L — ABNORMAL LOW (ref 135–145)
Total Bilirubin: 1.3 mg/dL — ABNORMAL HIGH (ref 0.3–1.2)
Total Protein: 8.7 g/dL — ABNORMAL HIGH (ref 6.5–8.1)

## 2020-03-22 LAB — URINE DRUG SCREEN, QUALITATIVE (ARMC ONLY)
Amphetamines, Ur Screen: NOT DETECTED
Barbiturates, Ur Screen: NOT DETECTED
Benzodiazepine, Ur Scrn: NOT DETECTED
Cannabinoid 50 Ng, Ur ~~LOC~~: NOT DETECTED
Cocaine Metabolite,Ur ~~LOC~~: NOT DETECTED
MDMA (Ecstasy)Ur Screen: NOT DETECTED
Methadone Scn, Ur: NOT DETECTED
Opiate, Ur Screen: NOT DETECTED
Phencyclidine (PCP) Ur S: NOT DETECTED
Tricyclic, Ur Screen: NOT DETECTED

## 2020-03-22 LAB — CBC WITH DIFFERENTIAL/PLATELET
Abs Immature Granulocytes: 0.13 10*3/uL — ABNORMAL HIGH (ref 0.00–0.07)
Basophils Absolute: 0.1 10*3/uL (ref 0.0–0.1)
Basophils Relative: 0 %
Eosinophils Absolute: 0 10*3/uL (ref 0.0–0.5)
Eosinophils Relative: 0 %
HCT: 37.1 % — ABNORMAL LOW (ref 39.0–52.0)
Hemoglobin: 12.1 g/dL — ABNORMAL LOW (ref 13.0–17.0)
Immature Granulocytes: 1 %
Lymphocytes Relative: 34 %
Lymphs Abs: 5.3 10*3/uL — ABNORMAL HIGH (ref 0.7–4.0)
MCH: 32.5 pg (ref 26.0–34.0)
MCHC: 32.6 g/dL (ref 30.0–36.0)
MCV: 99.7 fL (ref 80.0–100.0)
Monocytes Absolute: 0.3 10*3/uL (ref 0.1–1.0)
Monocytes Relative: 2 %
Neutro Abs: 9.8 10*3/uL — ABNORMAL HIGH (ref 1.7–7.7)
Neutrophils Relative %: 63 %
Platelets: 335 10*3/uL (ref 150–400)
RBC: 3.72 MIL/uL — ABNORMAL LOW (ref 4.22–5.81)
RDW: 15.2 % (ref 11.5–15.5)
WBC: 15.6 10*3/uL — ABNORMAL HIGH (ref 4.0–10.5)
nRBC: 0 % (ref 0.0–0.2)

## 2020-03-22 LAB — URINALYSIS, COMPLETE (UACMP) WITH MICROSCOPIC
Bilirubin Urine: NEGATIVE
Glucose, UA: NEGATIVE mg/dL
Ketones, ur: NEGATIVE mg/dL
Nitrite: POSITIVE — AB
Protein, ur: 100 mg/dL — AB
RBC / HPF: >50 RBC/hpf — ABNORMAL HIGH (ref 0–5)
Specific Gravity, Urine: 1.011 (ref 1.005–1.030)
WBC, UA: >50 WBC/hpf — ABNORMAL HIGH (ref 0–5)
pH: 6 (ref 5.0–8.0)

## 2020-03-22 LAB — BLOOD GAS, VENOUS
Acid-base deficit: 1.3 mmol/L (ref 0.0–2.0)
Bicarbonate: 24.8 mmol/L (ref 20.0–28.0)
FIO2: 100
O2 Saturation: 21.1 %
Patient temperature: 37
pCO2, Ven: 46 mmHg (ref 44.0–60.0)
pH, Ven: 7.34 (ref 7.250–7.430)
pO2, Ven: <31 mmHg — CL (ref 32.0–45.0)

## 2020-03-22 LAB — LACTIC ACID, PLASMA: Lactic Acid, Venous: 3.5 mmol/L (ref 0.5–1.9)

## 2020-03-22 LAB — PROTIME-INR
INR: 1.3 — ABNORMAL HIGH (ref 0.8–1.2)
Prothrombin Time: 15.3 seconds — ABNORMAL HIGH (ref 11.4–15.2)

## 2020-03-22 LAB — TROPONIN I (HIGH SENSITIVITY): Troponin I (High Sensitivity): 24 ng/L — ABNORMAL HIGH (ref <18)

## 2020-03-22 LAB — GLUCOSE, CAPILLARY: Glucose-Capillary: 86 mg/dL (ref 70–99)

## 2020-03-22 MED ORDER — AMIODARONE HCL 150 MG/3ML IV SOLN
INTRAVENOUS | Status: AC | PRN
  Administered 2020-03-22: 300 mg via INTRAVENOUS

## 2020-03-22 MED ORDER — SODIUM BICARBONATE 8.4 % IV SOLN
INTRAVENOUS | Status: AC | PRN
  Administered 2020-03-22: 100 meq via INTRAVENOUS

## 2020-03-22 MED ORDER — CALCIUM CHLORIDE 10 % IV SOLN
INTRAVENOUS | Status: AC | PRN
  Administered 2020-03-22: 1 g via INTRAVENOUS

## 2020-03-22 MED ORDER — ETOMIDATE 2 MG/ML IV SOLN
20.0000 mg | Freq: Once | INTRAVENOUS | Status: AC
  Administered 2020-03-22: 20 mg via INTRAVENOUS

## 2020-03-22 MED ORDER — SODIUM CHLORIDE 0.9 % IV BOLUS
1000.0000 mL | Freq: Once | INTRAVENOUS | Status: AC
  Administered 2020-03-22: 1000 mL via INTRAVENOUS

## 2020-03-22 MED ORDER — EPINEPHRINE 1 MG/10ML IJ SOSY
PREFILLED_SYRINGE | INTRAMUSCULAR | Status: AC | PRN
  Administered 2020-03-22 (×6): 1 via INTRAVENOUS

## 2020-03-22 MED ORDER — ROCURONIUM BROMIDE 50 MG/5ML IV SOLN
70.0000 mg | Freq: Once | INTRAVENOUS | Status: AC
  Administered 2020-03-22: 70 mg via INTRAVENOUS
  Filled 2020-03-22: qty 7

## 2020-03-22 MED ORDER — NOREPINEPHRINE 4 MG/250ML-% IV SOLN
0.0000 ug/min | INTRAVENOUS | Status: DC
  Administered 2020-03-22: 4 ug/min via INTRAVENOUS

## 2020-03-22 MED ORDER — ATROPINE SULFATE 1 MG/ML IJ SOLN
INTRAMUSCULAR | Status: AC | PRN
  Administered 2020-03-22: 1 mg via INTRAVENOUS

## 2020-03-23 ENCOUNTER — Telehealth: Payer: Self-pay

## 2020-03-23 LAB — SARS CORONAVIRUS 2 BY RT PCR (HOSPITAL ORDER, PERFORMED IN ~~LOC~~ HOSPITAL LAB): SARS Coronavirus 2: NEGATIVE

## 2020-03-23 NOTE — Telephone Encounter (Signed)
[place] Night - Client Nonclinical Telephone Record AccessNurse Client [place] Night - Client Client Site [place] Physician Ria Bush - MD Contact Type Call Who Is Calling Physician / Provider / Hospital Call Type Provider Call [facility] Page Now Reason for Call Request to speak to Physician Initial Comment The pt has expired. Additional Comment Patient Name Harold Chavez Patient DOB 1923/12/08 Requesting Provider Dr. Brenton Grills Physician Number [place] Name [facility] [facility] ED Room Number 1 Disp. Time Disposition Final User 04-16-20 8:50:26 PM Send to [place], Kathryn 04/16/2020 9:02:55 PM Paged On Call to Other Provider Hassie Bruce Apr 16, 2020 9:03:10 PM Page Completed Yes Hassie Bruce Paging DoctorName Phone DateTime Result/Outcome Message Type Notes Kathlene November - MD [phone] 04-16-2020 9:02:55 PM Paged On Call to Other Provider Doctor Paged This is Lauren with the Call Center. [facility] is needing to speak with you about a patient. Please contact them at [phone]. Thank you Kathlene November - MD 2020/04/16 9:03:02 PM Paged On Call to Another Provider Message Result Call Closed By: Hassie Bruce Transaction Date/Time: 04/16/20 8:48:22 PM (ET)

## 2020-03-23 NOTE — Telephone Encounter (Signed)
Patient seen at ER last night with acute decompensation and subsequent cardiac arrest.  I spoke with son regarding this.  Took him to ER for IV fluids, acute decompensation while being checked in.  Offered my condolences.

## 2020-03-30 DIAGNOSIS — 419620001 Death: Secondary | SNOMED CT | POA: Insufficient documentation

## 2020-03-30 NOTE — Code Documentation (Signed)
Pt shocked at 200J

## 2020-03-30 NOTE — ED Notes (Signed)
Epi given and 1x300mg  amioderone given

## 2020-03-30 NOTE — Code Documentation (Addendum)
Organ procurement team notified. [facility]) (Not a donor candidate)  Referral # [id number]

## 2020-03-30 NOTE — ED Notes (Signed)
Family at bedside. Grandson POA at bedside as well and funeral home information obtained.

## 2020-03-30 NOTE — ED Notes (Signed)
Pt in VFIB, Pt shocked at [place]. CPR resumed.

## 2020-03-30 NOTE — ED Notes (Signed)
cpr started

## 2020-03-30 NOTE — ED Notes (Signed)
Epi given

## 2020-03-30 NOTE — Code Documentation (Signed)
Pt with VFIB, shocked delivered at [place]. CPR continued.

## 2020-03-30 NOTE — ED Notes (Signed)
Pt in asystole. Time of death 12-26-20. MD and this RN to family room to update family.

## 2020-03-30 NOTE — ED Notes (Signed)
Family leaving. Pt placed in appropriate

## 2020-03-30 NOTE — ED Notes (Signed)
cpr resumed due to no pulse

## 2020-03-30 NOTE — ED Provider Notes (Signed)
[facility] Emergency Department Provider Note  ____________________________________________  Time seen: Approximately 9:12 PM  I have reviewed the triage vital signs and the nursing notes.   HISTORY  Chief Complaint Weakness   Level 5 Caveat: Portions of the History and Physical including HPI and review of systems are unable to be completely obtained due to patient being a poor historian   HPI Harold Chavez is a 86 y.o. male with a history of CKD, lymphoma, sarcoidosis who is brought to the ED by his grandson due to generalized weakness.  However, on arrival the patient became unresponsive and was immediately brought to the treatment room.  In the treatment room the patient is unresponsive, GCS of 3, no pain response.  He has spontaneous respirations.  Later history obtained from grandson is that he went to primary care yesterday due to generalized weakness, decreased appetite over the past week.  Labs and chest x-ray at that time were reassuring.      Past Medical History:  Diagnosis Date  . B12 deficiency anemia   . BCC (basal cell carcinoma of skin) 2017   L frontal scalp  . Benign paroxysmal positional vertigo   . C. difficile diarrhea 07/14/2014   2015   . Chronic kidney disease   . History of pneumonia 07/14/2014   2015   . History of shingles 2010  . IgG gammopathy   . 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 Leukemia ([place])    CLL  . 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 Sarcoidosis   . Sensorineural hearing loss (SNHL) of both ears 10/2015   haering aide eval rec through [facility])  . Squamous cell carcinoma in situ of skin 2017   R forearm, nose, R cheek - [place]     Patient Active Problem List   Diagnosis Date Noted  . Malaise and fatigue 03/21/2020  . Vitamin D deficiency 03/21/2020  . Fever blister 03/21/2020  . Nostril sore 03/21/2020  . Urinary incontinence  12/20/2019  . Elbow mass, right 11/23/2019  . Dizziness 09/26/2016  . Advanced care planning/counseling discussion 10/16/2015  . Medicare annual wellness visit, initial 10/16/2015  . Sensorineural hearing loss (SNHL) of both ears 10/01/2015  . Essential hypertension 04/14/2015  . Chronic lymphocytic leukemia ([place])   . IgG gammopathy 11/19/2013  . 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 CKD (chronic kidney disease) stage 3, GFR 30-59 ml/min 11/10/2013  . History of malignant melanoma 11/10/2013  . BENIGN POSITIONAL VERTIGO 04/17/2009  . Sarcoidosis 03/02/2007     Past Surgical History:  Procedure Laterality Date  . MOHS SURGERY  2015   R cheek lentigo maligna in situ  . SKIN CANCER EXCISION  07/2018  . TESTICLE REMOVAL  1937   right; undescended     Prior to Admission medications   Medication Sig Start Date End Date Taking? Authorizing Provider  atenolol (TENORMIN) 25 MG tablet Take 1 tablet (25 mg total) by mouth 2 (two) times daily. 11/23/19   Ria Bush, MD  Cholecalciferol (VITAMIN D3) 25 MCG (1000 UT) CAPS Take 1 capsule (1,000 Units total) by mouth daily. 11/23/19   Ria Bush, MD  Docusate Calcium (STOOL SOFTENER PO) Take by mouth as needed.    [provider]  mupirocin ointment (BACTROBAN) 2 % Place 1 application into the nose 2 (two) times daily. 03/21/20   Ria Bush, MD  Probiotic Product (PROBIOTIC  PO) Take by mouth daily.    [provider]  vitamin B-12 (V-R VITAMIN B-12) 500 MCG tablet Take 1 tablet (500 mcg total) by mouth daily. 11/23/19   Ria Bush, MD     Allergies Patient has no known allergies.   Family History  Problem Relation Age of Onset  . Cancer Neg Hx   . Stroke Neg Hx   . CAD Neg Hx   . Diabetes Neg Hx     Social History Social History   Tobacco Use  . Smoking status: Never Smoker  . Smokeless tobacco: Never Used  Vaping Use  . Vaping Use: Never used  Substance Use Topics   . Alcohol use: No  . Drug use: No    Review of Systems Level 5 Caveat: Portions of the History and Physical including HPI and review of systems are unable to be completely obtained due to patient being a poor historian   Constitutional:   No known fever.  ENT:   No rhinorrhea. Cardiovascular:   No chest pain or syncope. Respiratory:   No dyspnea or cough. Gastrointestinal:   Negative for abdominal pain, vomiting and diarrhea.  Musculoskeletal:   Negative for focal pain or swelling ____________________________________________   PHYSICAL EXAM:  VITAL SIGNS: ED Triage Vitals [04/14/2020 2009]  Enc Vitals Group     BP (!) 65/12     Pulse Rate (!) 148     Resp (!) 5     Temp (!) 100.7 F (38.2 C)     Temp src      SpO2 (!) 87 %     Weight      Height      Head Circumference      Peak Flow      Pain Score      Pain Loc      Pain Edu?      Excl. in [place]?     Vital signs reviewed, nursing assessments reviewed.   Constitutional:   Unresponsive.  Ill-appearing.. Eyes:   Conjunctivae are normal. EOM untestable. PERRL, sluggish. ENT      Head:   Normocephalic and atraumatic.      Nose:   No congestion/rhinnorhea.       Mouth/Throat:   Dry mucous membranes, no pharyngeal erythema. No peritonsillar mass.       Neck:   No meningismus. Full ROM. Hematological/Lymphatic/Immunilogical:   No cervical lymphadenopathy. Cardiovascular:   RRR, rate of 80. Symmetric bilateral radial and DP pulses.  No murmurs. Cap refill 2 to 3 seconds Respiratory: Agonal spontaneous respirations. Gastrointestinal:   Soft and nontender. Non distended. There is no CVA tenderness.  No rebound, rigidity, or guarding. Genitourinary:   Normal Musculoskeletal:   Normal range of motion in all extremities. No joint effusions.  No lower extremity tenderness.  No edema. Neurologic:   GCS 3.  Just prior to intubation, patient exhibited diffuse tremor activity in all 4 extremities, concerning for seizure Skin:     Skin is warm, dry and intact. No rash noted.  No petechiae, purpura, or bullae.  ____________________________________________    LABS (pertinent positives/negatives) (all labs ordered are listed, but only abnormal results are displayed) Labs Reviewed  CBC WITH DIFFERENTIAL/PLATELET - Abnormal; Notable for the following components:      Result Value   WBC 15.6 (*)    RBC 3.72 (*)    Hemoglobin 12.1 (*)    HCT 37.1 (*)    Neutro Abs 9.8 (*)    Lymphs Abs  5.3 (*)    Abs Immature Granulocytes 0.13 (*)    All other components within normal limits  PROTIME-INR - Abnormal; Notable for the following components:   Prothrombin Time 15.3 (*)    INR 1.3 (*)    All other components within normal limits  LACTIC ACID, PLASMA - Abnormal; Notable for the following components:   Lactic Acid, Venous 3.5 (*)    All other components within normal limits  BLOOD GAS, VENOUS - Abnormal; Notable for the following components:   pO2, Ven <31.0 (*)    All other components within normal limits  SARS CORONAVIRUS 2 BY RT PCR (HOSPITAL ORDER, [place] [facility])  GLUCOSE, CAPILLARY  COMPREHENSIVE METABOLIC PANEL  SALICYLATE LEVEL  URINALYSIS, COMPLETE (UACMP) WITH MICROSCOPIC  URINE DRUG SCREEN, QUALITATIVE ([facility] ONLY)  TROPONIN I (HIGH SENSITIVITY)  TROPONIN I (HIGH SENSITIVITY)   ____________________________________________   EKG  Initial EKG at 7:53 PM shows sinus rhythm, rate of 62, normal axis and intervals.  Right bundle branch block.  No significant ST changes, normal T waves.  Repeat EKG at 8:00 PM shows sinus tachycardia, rate of 136, normal axis and intervals.  Right bundle branch block.  Evolving ST depression in anterolateral leads  ____________________________________________    RADIOLOGY  No results found.  ____________________________________________   PROCEDURES .Critical Care Performed by: Carrie Mew, MD Authorized by: Carrie Mew, MD    Critical care provider statement:    Critical care time (minutes):  40   Critical care time was exclusive of:  Separately billable procedures and treating other patients   Critical care was necessary to treat or prevent imminent or life-threatening deterioration of the following conditions:  CNS failure or compromise, cardiac failure, circulatory failure, dehydration and shock   Critical care was time spent personally by me on the following activities:  Development of treatment plan with patient or surrogate, discussions with consultants, evaluation of patient's response to treatment, examination of patient, obtaining history from patient or surrogate, ordering and performing treatments and interventions, ordering and review of laboratory studies, ordering and review of radiographic studies, pulse oximetry, re-evaluation of patient's condition, review of old charts and ventilator management Comments:        .1-3 Lead EKG Interpretation Performed by: Carrie Mew, MD Authorized by: Carrie Mew, MD     Interpretation: abnormal     ECG rate:  130   ECG rate assessment: tachycardic     Rhythm: sinus rhythm     Ectopy: PVCs     Conduction: normal   Comments:        Procedure Name: Intubation Date/Time: Apr 06, 2020 9:25 PM Performed by: Carrie Mew, MD Pre-anesthesia Checklist: Patient identified, Patient being monitored, Emergency Drugs available, Timeout performed and Suction available Oxygen Delivery Method: Non-rebreather mask Preoxygenation: Pre-oxygenation with 100% oxygen Induction Type: Rapid sequence Ventilation: Mask ventilation without difficulty Laryngoscope Size: Glidescope and 3 Grade View: Grade I Tube size: 7.5 mm Number of attempts: 1 Placement Confirmation: ETT inserted through vocal cords under direct vision,  CO2 detector and Breath sounds checked- equal and bilateral Secured at: 23 cm Tube secured with: ETT holder Dental Injury: Teeth and  Oropharynx as per pre-operative assessment        ____________________________________________  DIFFERENTIAL DIAGNOSIS   Hyperkalemia, dehydration, myocardial infarction, intracranial hemorrhage  CLINICAL IMPRESSION / ASSESSMENT AND PLAN / ED COURSE  Medications ordered in the ED: Medications  norepinephrine (LEVOPHED) 4mg  in 275mL premix infusion (0 mcg/min Intravenous Stopped 04/06/2020 2025)  sodium chloride  0.9 % bolus 1,000 mL (0 mLs Intravenous Stopped 27-Mar-2020 2025)  etomidate (AMIDATE) injection 20 mg (20 mg Intravenous Given 03-27-2020 1945)  rocuronium (ZEMURON) injection 70 mg (70 mg Intravenous Given 2020-03-27 1946)  sodium chloride 0.9 % bolus 1,000 mL (0 mLs Intravenous Stopped 03-27-2020 2025)  EPINEPHrine (ADRENALIN) 1 MG/10ML injection (1 Syringe Intravenous Given March 27, 2020 12/27/2019)  amiodarone (CORDARONE) injection (300 mg Intravenous Given March 27, 2020 12-26-04)  atropine injection (1 mg Intravenous Given 03/27/2020 1957)  sodium bicarbonate injection (100 mEq Intravenous Given 27-Mar-2020 1955)  calcium chloride injection (1 g Intravenous Given 27-Mar-2020 1955)    Pertinent labs & imaging results that were available during my care of the patient were reviewed by me and considered in my medical decision making (see chart for details).   Harold Chavez was evaluated in Emergency Department on 03-27-2020 for the symptoms described in the history of present illness. He was evaluated in the context of the global COVID-19 pandemic, which necessitated consideration that the patient might be at risk for infection with the SARS-CoV-2 virus that causes COVID-19. Institutional protocols and algorithms that pertain to the evaluation of patients at risk for COVID-19 are in a state of rapid change based on information released by regulatory bodies including the CDC and federal and state organizations. These policies and algorithms were followed during the patient's care in the ED.   Patient unresponsive on  arrival.  Due to need for airway protection, concern for intracranial hemorrhage initially, intubation was performed immediately with RSI.  Afterward, there is a change in EKG, concerning for evolving ischemia.  Blood pressure was found to be 40/20, so norepinephrine was started at 10 mics per minute immediately, wide open saline bolus.  Unfortunately, rhythm deteriorated quickly and patient suffered cardiac arrest.  See notes for ACLS details which included multiple rounds of epinephrine, calcium chloride and sodium bicarbonate due to peaked T waves on monitor.  Rhythm check revealed ventricular tachycardia, so cardioversion was attempted, subsequent rhythm check showed V. fib, so defibrillation was attempted and amiodarone bolus was given.  Unfortunately after 25 minutes of ACLS, the patient had been in asystole for multiple pulse checks, no response to medications, end-tidal CO2 less than 10, chance of meaningful recovery remote.  Resuscitation was terminated at that time, 8:22 PM.  Clinical Course as of Mar 22 2126  Wed 2020/03/27 Presentation and clinical course discussed with the grandson and the family consult room with nurse Lorriane Shire and chaplain.  Informed him of the patient's death.   [PS]  12-26-2109 Discussed with on-call PCP from [facility], Dr. Larose Kells who will pass the message to PCP Dr. Danise Mina to have death certificate completed.   [PS]    Clinical Course User Index [PS] Carrie Mew, MD     ____________________________________________   FINAL CLINICAL IMPRESSION(S) / ED DIAGNOSES    Final diagnoses:  Cardiac arrest [facility])     ED Discharge Orders    None      Portions of this note were generated with dragon dictation software. Dictation errors may occur despite best attempts at proofreading.   Carrie Mew, MD 2020-03-27 Dec 26, 2125

## 2020-03-30 NOTE — ED Notes (Signed)
Bicarb and calcium given

## 2020-03-30 NOTE — ED Notes (Signed)
Atropine given 

## 2020-03-30 NOTE — ED Notes (Signed)
Pulse felt cpr paused

## 2020-03-30 NOTE — ED Notes (Signed)
Pt transported to morgue. Tags in place, paperwork given to [place], [place].

## 2020-03-30 NOTE — ED Notes (Signed)
[place] delivered 200v

## 2020-03-30 NOTE — Telephone Encounter (Addendum)
Spoke with son. Persistent low appetite and strength.  Drinking some water and gatorade - only about 36oz total - no appetite for more.  Discussed recent labs - with evidence of acute on chronic kidney insufficiency. ?progressive deterioration with age vs acute process. Recent cystoscopy. Recent UTI evaluations negative for infection.  Encouraged pushing small sips of fluids over next several days. Consider updated UA/culture and rpt labs later this week pending how he does.  Discussed possible palliative care evaluation - son may be open to this. rec decrease atenolol to once daily.  I asked son to call us tomorrow with update on symptoms.

## 2020-03-30 NOTE — Progress Notes (Signed)
[place] received verbal referral from ED secretary while rounding in ED because medical team performing CPR on pt.; secretary reported pt.'s family member in family waiting rm.  [facility] entered pt.'s rm --> team in the midst of compressions, MD said prognosis did not seem good; [place] said he would go sit w/family until MD could come give update.  Pt.'s son [name] in waiting rm.; son reported he drove Pt. into hospital when he suddenly became weak --> Pt. became unresponsive when he arrived at ED admitting desk, son said.  MD and RN arrived shortly afterward and reported pt. had needed to be intubated shortly after being brought back to rm. --> pt. coded and CPR was initiated, but team was unable to revive pt.  Son shocked by this news, tearful.  [place] remained w/son as he made phone calls to family members sharing news re: pt.   RN came back to inform son pt. ready to view; [place] accompanied son to rm. and later helped pt.'s nephew and his wife get to rm.  [place] paged to ICU, but remains available for any family support needs.  Family grateful for [facility] presence.

## 2020-03-30 NOTE — ED Notes (Signed)
Another epi given

## 2020-03-30 NOTE — Telephone Encounter (Signed)
Patient's son contacted the office and states that all he had yesterday afternoon was vanilla pudding - and he states that he has only had a few bites of chicken and rice soup today. He states he has no energy or strength also. He is wondering if Dr. Darnell Level has any other recommendations?

## 2020-03-30 NOTE — ED Notes (Signed)
Pulse check: no pulse 

## 2020-03-30 DEATH — deceased

## 2020-08-31 ENCOUNTER — Other Ambulatory Visit: Payer: Medicare Other

## 2020-08-31 ENCOUNTER — Ambulatory Visit: Payer: Medicare Other | Admitting: Oncology

## 2020-11-23 ENCOUNTER — Other Ambulatory Visit: Payer: Medicare Other

## 2020-11-23 ENCOUNTER — Ambulatory Visit: Payer: Medicare Other

## 2020-11-27 ENCOUNTER — Ambulatory Visit: Payer: Medicare Other | Admitting: Family Medicine
